# Patient Record
Sex: Female | Born: 1945
Health system: Southern US, Community
[De-identification: ages and names within clinical notes are randomized; demographics above are authoritative.]

## PROBLEM LIST (undated history)

## (undated) DIAGNOSIS — Z9221 Personal history of antineoplastic chemotherapy: Secondary | ICD-10-CM

## (undated) DIAGNOSIS — Z86711 Personal history of pulmonary embolism: Secondary | ICD-10-CM

## (undated) DIAGNOSIS — C801 Malignant (primary) neoplasm, unspecified: Secondary | ICD-10-CM

## (undated) DIAGNOSIS — G8929 Other chronic pain: Secondary | ICD-10-CM

## (undated) DIAGNOSIS — I82622 Acute embolism and thrombosis of deep veins of left upper extremity: Secondary | ICD-10-CM

## (undated) DIAGNOSIS — Z8759 Personal history of other complications of pregnancy, childbirth and the puerperium: Secondary | ICD-10-CM

## (undated) DIAGNOSIS — M549 Dorsalgia, unspecified: Secondary | ICD-10-CM

## (undated) HISTORY — PX: TUBAL LIGATION: SHX77

## (undated) HISTORY — PX: HIP FRACTURE SURGERY: SHX118

---

## 1999-06-13 ENCOUNTER — Ambulatory Visit (HOSPITAL_COMMUNITY): Admission: RE | Admit: 1999-06-13 | Discharge: 1999-06-13 | Payer: Self-pay | Admitting: Family Medicine

## 1999-06-13 ENCOUNTER — Encounter: Payer: Self-pay | Admitting: Family Medicine

## 1999-07-30 ENCOUNTER — Other Ambulatory Visit: Admission: RE | Admit: 1999-07-30 | Discharge: 1999-07-30 | Payer: Self-pay | Admitting: Obstetrics & Gynecology

## 1999-07-31 ENCOUNTER — Other Ambulatory Visit: Admission: RE | Admit: 1999-07-31 | Discharge: 1999-07-31 | Payer: Self-pay | Admitting: Obstetrics & Gynecology

## 1999-07-31 ENCOUNTER — Encounter (INDEPENDENT_AMBULATORY_CARE_PROVIDER_SITE_OTHER): Payer: Self-pay | Admitting: Specialist

## 1999-10-03 ENCOUNTER — Ambulatory Visit (HOSPITAL_COMMUNITY): Admission: RE | Admit: 1999-10-03 | Discharge: 1999-10-03 | Payer: Self-pay | Admitting: Obstetrics & Gynecology

## 1999-10-03 ENCOUNTER — Encounter (INDEPENDENT_AMBULATORY_CARE_PROVIDER_SITE_OTHER): Payer: Self-pay

## 1999-11-13 ENCOUNTER — Ambulatory Visit (HOSPITAL_COMMUNITY): Admission: RE | Admit: 1999-11-13 | Discharge: 1999-11-13 | Payer: Self-pay | Admitting: Family Medicine

## 1999-11-13 ENCOUNTER — Encounter: Payer: Self-pay | Admitting: Family Medicine

## 1999-11-15 ENCOUNTER — Inpatient Hospital Stay (HOSPITAL_COMMUNITY): Admission: EM | Admit: 1999-11-15 | Discharge: 1999-11-20 | Payer: Self-pay | Admitting: Emergency Medicine

## 2000-02-21 ENCOUNTER — Emergency Department (HOSPITAL_COMMUNITY): Admission: EM | Admit: 2000-02-21 | Discharge: 2000-02-21 | Payer: Self-pay | Admitting: Emergency Medicine

## 2001-07-01 ENCOUNTER — Other Ambulatory Visit: Admission: RE | Admit: 2001-07-01 | Discharge: 2001-07-01 | Payer: Self-pay | Admitting: Obstetrics & Gynecology

## 2002-05-30 ENCOUNTER — Encounter: Payer: Self-pay | Admitting: Podiatry

## 2002-05-30 ENCOUNTER — Inpatient Hospital Stay (HOSPITAL_COMMUNITY): Admission: EM | Admit: 2002-05-30 | Discharge: 2002-06-02 | Payer: Self-pay | Admitting: *Deleted

## 2002-05-30 ENCOUNTER — Ambulatory Visit (HOSPITAL_COMMUNITY): Admission: RE | Admit: 2002-05-30 | Discharge: 2002-05-30 | Payer: Self-pay | Admitting: Podiatry

## 2002-09-09 ENCOUNTER — Encounter: Payer: Self-pay | Admitting: Family Medicine

## 2002-09-09 ENCOUNTER — Encounter: Admission: RE | Admit: 2002-09-09 | Discharge: 2002-09-09 | Payer: Self-pay | Admitting: Family Medicine

## 2003-06-23 ENCOUNTER — Other Ambulatory Visit: Admission: RE | Admit: 2003-06-23 | Discharge: 2003-06-23 | Payer: Self-pay | Admitting: Family Medicine

## 2003-07-07 ENCOUNTER — Ambulatory Visit (HOSPITAL_COMMUNITY): Admission: RE | Admit: 2003-07-07 | Discharge: 2003-07-07 | Payer: Self-pay | Admitting: Family Medicine

## 2003-07-07 ENCOUNTER — Encounter: Payer: Self-pay | Admitting: Family Medicine

## 2004-04-19 ENCOUNTER — Ambulatory Visit (HOSPITAL_COMMUNITY): Admission: RE | Admit: 2004-04-19 | Discharge: 2004-04-19 | Payer: Self-pay | Admitting: Family Medicine

## 2005-01-16 ENCOUNTER — Other Ambulatory Visit: Admission: RE | Admit: 2005-01-16 | Discharge: 2005-01-16 | Payer: Self-pay | Admitting: Obstetrics & Gynecology

## 2006-04-03 ENCOUNTER — Other Ambulatory Visit: Admission: RE | Admit: 2006-04-03 | Discharge: 2006-04-03 | Payer: Self-pay | Admitting: Family Medicine

## 2009-07-04 ENCOUNTER — Emergency Department (HOSPITAL_COMMUNITY): Admission: EM | Admit: 2009-07-04 | Discharge: 2009-07-05 | Payer: Self-pay | Admitting: Emergency Medicine

## 2010-07-31 ENCOUNTER — Ambulatory Visit (HOSPITAL_COMMUNITY): Admission: RE | Admit: 2010-07-31 | Discharge: 2010-07-31 | Payer: Self-pay | Admitting: Orthopedic Surgery

## 2011-01-31 LAB — CBC
HCT: 36.5 % (ref 36.0–46.0)
MCHC: 32.8 g/dL (ref 30.0–36.0)
MCV: 82.7 fL (ref 78.0–100.0)
Platelets: 272 10*3/uL (ref 150–400)
RDW: 15.1 % (ref 11.5–15.5)
WBC: 8.5 10*3/uL (ref 4.0–10.5)

## 2011-01-31 LAB — DIFFERENTIAL
Basophils Absolute: 0 10*3/uL (ref 0.0–0.1)
Basophils Relative: 0 % (ref 0–1)
Eosinophils Absolute: 0.2 10*3/uL (ref 0.0–0.7)
Eosinophils Relative: 2 % (ref 0–5)
Lymphs Abs: 2.7 10*3/uL (ref 0.7–4.0)
Neutrophils Relative %: 61 % (ref 43–77)

## 2011-01-31 LAB — POCT I-STAT, CHEM 8
BUN: 20 mg/dL (ref 6–23)
Calcium, Ion: 1.12 mmol/L (ref 1.12–1.32)
Chloride: 108 mEq/L (ref 96–112)
Creatinine, Ser: 0.8 mg/dL (ref 0.4–1.2)
Glucose, Bld: 85 mg/dL (ref 70–99)
TCO2: 25 mmol/L (ref 0–100)

## 2011-01-31 LAB — PROTIME-INR
INR: 2.3 — ABNORMAL HIGH (ref 0.00–1.49)
Prothrombin Time: 24.8 seconds — ABNORMAL HIGH (ref 11.6–15.2)

## 2011-03-14 NOTE — Op Note (Signed)
[facility]  Patient:    Brittany [name]                      MRN: [id number] Proc. Date: 10/03/99 Adm. Date:  [id number] Attending:  Minette Headland                           Operative Report  PREOPERATIVE DIAGNOSIS:       Perimenopausal female on hormone replacement therapy with dysfunctional uterine bleeding, endometrial thickening, and probable small  endometrial polyp.  POSTOPERATIVE DIAGNOSIS:      Perimenopausal female on hormone replacement therapy with dysfunctional uterine bleeding, endometrial thickening, and probable small  endometrial polyp.  OPERATION:                    Hysteroscopy, D&C, and removal of polyp.  SURGEON:                      Freddy Finner, M.D.  ASSISTANT:  ANESTHESIA:                   Managed intravenous sedation, paracervical block using 10 cc of 1% plain Xylocaine with injections at 4 and 8 oclock in the vaginal fornices.  ESTIMATED BLOOD LOSS:         Less than 50 cc.  SORBITOL:                     Intraoperative Sorbitol deficit less than or equal to 100 cc.  INDICATIONS:                  The patient is a 65 year old on hormone replacement therapy and who has had irregular vaginal bleeding and had an ultrasound in the  office which showed significant thickening of the endometrium and what was thought to probably be a small endometrial polyp.  She is admitted now for a hysteroscopy, D&C.  FINDINGS:                     A small polyp.  The uterus sounded to 8.5 cm.  DESCRIPTION OF PROCEDURE:     The patient was admitted on the morning of surgery, brought to the operating room, given adequate intravenous sedation, and placed n the dorsal lithotomy position.  Betadine prep of the vagina was carried out. Bivalve speculum was introduced.  The cervix was cleansed again with Betadine.  Paracervical block was placed with 10 cc of 1% plain Xylocaine.  The anterior cervical lip was grasped with a  single tooth tenaculum.  The uterus sounded to .5 cm.  The uterine cervix was dilated to 23 with [place].  The [facility] hysteroscope was introduced and inspection revealed what appeared to be a small polyp free floating in the cavity and attached by a thin base.  Gentle thorough curettage and exploration with polyp forceps was performed.  Repeat inspection revealed absence of the polyp.  The procedure was terminated at this point.  The patient was taken to the recovery room in good condition and will be discharged with routine outpatient surgical instructions for follow-up in the office in 7 to 10 days. he is given Darvocet to be taken as needed for cramping pain if not relieved by Tylenol or Advil. DD:  10/03/99 TD:  10/03/99 Job: [id number]

## 2011-12-26 DIAGNOSIS — Z7901 Long term (current) use of anticoagulants: Secondary | ICD-10-CM | POA: Diagnosis not present

## 2012-01-30 DIAGNOSIS — Z7901 Long term (current) use of anticoagulants: Secondary | ICD-10-CM | POA: Diagnosis not present

## 2012-02-26 DIAGNOSIS — Z7901 Long term (current) use of anticoagulants: Secondary | ICD-10-CM | POA: Diagnosis not present

## 2012-03-25 DIAGNOSIS — Z7901 Long term (current) use of anticoagulants: Secondary | ICD-10-CM | POA: Diagnosis not present

## 2012-04-28 ENCOUNTER — Other Ambulatory Visit: Payer: Self-pay | Admitting: Family Medicine

## 2012-04-28 ENCOUNTER — Other Ambulatory Visit (HOSPITAL_COMMUNITY): Payer: Self-pay | Admitting: Family Medicine

## 2012-04-28 ENCOUNTER — Other Ambulatory Visit (HOSPITAL_COMMUNITY)
Admission: RE | Admit: 2012-04-28 | Discharge: 2012-04-28 | Disposition: A | Discharge status: Home / Self Care | Payer: Medicare Other | Source: Ambulatory Visit | Attending: Family Medicine | Admitting: Family Medicine

## 2012-04-28 DIAGNOSIS — Z124 Encounter for screening for malignant neoplasm of cervix: Secondary | ICD-10-CM | POA: Insufficient documentation

## 2012-04-28 DIAGNOSIS — Z780 Asymptomatic menopausal state: Secondary | ICD-10-CM

## 2012-04-28 DIAGNOSIS — Z7901 Long term (current) use of anticoagulants: Secondary | ICD-10-CM | POA: Diagnosis not present

## 2012-04-28 DIAGNOSIS — Z23 Encounter for immunization: Secondary | ICD-10-CM | POA: Diagnosis not present

## 2012-04-28 DIAGNOSIS — E785 Hyperlipidemia, unspecified: Secondary | ICD-10-CM | POA: Diagnosis not present

## 2012-04-28 DIAGNOSIS — Z0000 Encounter for general adult medical examination without abnormal findings: Secondary | ICD-10-CM | POA: Diagnosis not present

## 2012-04-28 DIAGNOSIS — R002 Palpitations: Secondary | ICD-10-CM | POA: Diagnosis not present

## 2012-04-28 DIAGNOSIS — Z1231 Encounter for screening mammogram for malignant neoplasm of breast: Secondary | ICD-10-CM

## 2012-04-30 DIAGNOSIS — E785 Hyperlipidemia, unspecified: Secondary | ICD-10-CM | POA: Diagnosis not present

## 2012-05-15 DIAGNOSIS — L259 Unspecified contact dermatitis, unspecified cause: Secondary | ICD-10-CM | POA: Diagnosis not present

## 2012-05-27 ENCOUNTER — Ambulatory Visit (HOSPITAL_COMMUNITY)
Admission: RE | Admit: 2012-05-27 | Discharge: 2012-05-27 | Disposition: A | Discharge status: Home / Self Care | Payer: Medicare Other | Source: Ambulatory Visit | Attending: Family Medicine | Admitting: Family Medicine

## 2012-05-27 DIAGNOSIS — Z13820 Encounter for screening for osteoporosis: Secondary | ICD-10-CM | POA: Diagnosis not present

## 2012-05-27 DIAGNOSIS — Z1231 Encounter for screening mammogram for malignant neoplasm of breast: Secondary | ICD-10-CM | POA: Diagnosis not present

## 2012-05-27 DIAGNOSIS — Z780 Asymptomatic menopausal state: Secondary | ICD-10-CM | POA: Insufficient documentation

## 2012-06-07 DIAGNOSIS — Z7901 Long term (current) use of anticoagulants: Secondary | ICD-10-CM | POA: Diagnosis not present

## 2012-06-15 ENCOUNTER — Other Ambulatory Visit: Payer: Self-pay | Admitting: Gastroenterology

## 2012-06-15 DIAGNOSIS — Z1211 Encounter for screening for malignant neoplasm of colon: Secondary | ICD-10-CM | POA: Diagnosis not present

## 2012-06-15 DIAGNOSIS — D128 Benign neoplasm of rectum: Secondary | ICD-10-CM | POA: Diagnosis not present

## 2012-06-15 DIAGNOSIS — D126 Benign neoplasm of colon, unspecified: Secondary | ICD-10-CM | POA: Diagnosis not present

## 2012-06-15 DIAGNOSIS — D129 Benign neoplasm of anus and anal canal: Secondary | ICD-10-CM | POA: Diagnosis not present

## 2012-07-05 DIAGNOSIS — Z7901 Long term (current) use of anticoagulants: Secondary | ICD-10-CM | POA: Diagnosis not present

## 2012-08-02 DIAGNOSIS — Z7901 Long term (current) use of anticoagulants: Secondary | ICD-10-CM | POA: Diagnosis not present

## 2012-09-02 DIAGNOSIS — Z7901 Long term (current) use of anticoagulants: Secondary | ICD-10-CM | POA: Diagnosis not present

## 2012-10-07 DIAGNOSIS — Z7901 Long term (current) use of anticoagulants: Secondary | ICD-10-CM | POA: Diagnosis not present

## 2012-11-11 DIAGNOSIS — Z7901 Long term (current) use of anticoagulants: Secondary | ICD-10-CM | POA: Diagnosis not present

## 2012-12-17 DIAGNOSIS — Z7901 Long term (current) use of anticoagulants: Secondary | ICD-10-CM | POA: Diagnosis not present

## 2013-01-13 DIAGNOSIS — Z7901 Long term (current) use of anticoagulants: Secondary | ICD-10-CM | POA: Diagnosis not present

## 2013-02-17 DIAGNOSIS — Z7901 Long term (current) use of anticoagulants: Secondary | ICD-10-CM | POA: Diagnosis not present

## 2013-03-17 DIAGNOSIS — L259 Unspecified contact dermatitis, unspecified cause: Secondary | ICD-10-CM | POA: Diagnosis not present

## 2013-03-17 DIAGNOSIS — Z5181 Encounter for therapeutic drug level monitoring: Secondary | ICD-10-CM | POA: Diagnosis not present

## 2013-03-31 DIAGNOSIS — Z7901 Long term (current) use of anticoagulants: Secondary | ICD-10-CM | POA: Diagnosis not present

## 2013-05-06 DIAGNOSIS — Z7901 Long term (current) use of anticoagulants: Secondary | ICD-10-CM | POA: Diagnosis not present

## 2013-06-16 DIAGNOSIS — Z7901 Long term (current) use of anticoagulants: Secondary | ICD-10-CM | POA: Diagnosis not present

## 2013-07-21 DIAGNOSIS — Z23 Encounter for immunization: Secondary | ICD-10-CM | POA: Diagnosis not present

## 2013-07-21 DIAGNOSIS — Z7901 Long term (current) use of anticoagulants: Secondary | ICD-10-CM | POA: Diagnosis not present

## 2013-08-19 DIAGNOSIS — I749 Embolism and thrombosis of unspecified artery: Secondary | ICD-10-CM | POA: Diagnosis not present

## 2013-08-19 DIAGNOSIS — Z7901 Long term (current) use of anticoagulants: Secondary | ICD-10-CM | POA: Diagnosis not present

## 2013-09-16 DIAGNOSIS — Z7901 Long term (current) use of anticoagulants: Secondary | ICD-10-CM | POA: Diagnosis not present

## 2013-09-16 DIAGNOSIS — I749 Embolism and thrombosis of unspecified artery: Secondary | ICD-10-CM | POA: Diagnosis not present

## 2013-10-12 DIAGNOSIS — I749 Embolism and thrombosis of unspecified artery: Secondary | ICD-10-CM | POA: Diagnosis not present

## 2013-10-12 DIAGNOSIS — Z7901 Long term (current) use of anticoagulants: Secondary | ICD-10-CM | POA: Diagnosis not present

## 2013-11-09 DIAGNOSIS — I749 Embolism and thrombosis of unspecified artery: Secondary | ICD-10-CM | POA: Diagnosis not present

## 2013-11-09 DIAGNOSIS — Z7901 Long term (current) use of anticoagulants: Secondary | ICD-10-CM | POA: Diagnosis not present

## 2013-12-09 DIAGNOSIS — I749 Embolism and thrombosis of unspecified artery: Secondary | ICD-10-CM | POA: Diagnosis not present

## 2013-12-09 DIAGNOSIS — Z7901 Long term (current) use of anticoagulants: Secondary | ICD-10-CM | POA: Diagnosis not present

## 2013-12-29 ENCOUNTER — Other Ambulatory Visit (HOSPITAL_COMMUNITY): Payer: Self-pay | Admitting: Family Medicine

## 2013-12-29 DIAGNOSIS — Z1231 Encounter for screening mammogram for malignant neoplasm of breast: Secondary | ICD-10-CM

## 2014-01-03 ENCOUNTER — Ambulatory Visit (HOSPITAL_COMMUNITY)
Admission: RE | Admit: 2014-01-03 | Discharge: 2014-01-03 | Disposition: A | Discharge status: Home / Self Care | Payer: Medicare Other | Source: Ambulatory Visit | Attending: Family Medicine | Admitting: Family Medicine

## 2014-01-03 DIAGNOSIS — Z1231 Encounter for screening mammogram for malignant neoplasm of breast: Secondary | ICD-10-CM | POA: Insufficient documentation

## 2014-01-20 DIAGNOSIS — Z23 Encounter for immunization: Secondary | ICD-10-CM | POA: Diagnosis not present

## 2014-01-20 DIAGNOSIS — I749 Embolism and thrombosis of unspecified artery: Secondary | ICD-10-CM | POA: Diagnosis not present

## 2014-01-20 DIAGNOSIS — Z0000 Encounter for general adult medical examination without abnormal findings: Secondary | ICD-10-CM | POA: Diagnosis not present

## 2014-01-20 DIAGNOSIS — E785 Hyperlipidemia, unspecified: Secondary | ICD-10-CM | POA: Diagnosis not present

## 2014-01-20 DIAGNOSIS — Z7901 Long term (current) use of anticoagulants: Secondary | ICD-10-CM | POA: Diagnosis not present

## 2014-01-20 DIAGNOSIS — R030 Elevated blood-pressure reading, without diagnosis of hypertension: Secondary | ICD-10-CM | POA: Diagnosis not present

## 2015-04-03 ENCOUNTER — Other Ambulatory Visit (HOSPITAL_COMMUNITY): Payer: Self-pay | Admitting: Family Medicine

## 2015-04-24 ENCOUNTER — Other Ambulatory Visit: Payer: Self-pay | Admitting: Family Medicine

## 2015-04-24 DIAGNOSIS — N6320 Unspecified lump in the left breast, unspecified quadrant: Secondary | ICD-10-CM

## 2015-04-24 DIAGNOSIS — Z0000 Encounter for general adult medical examination without abnormal findings: Secondary | ICD-10-CM | POA: Diagnosis not present

## 2015-04-24 DIAGNOSIS — E785 Hyperlipidemia, unspecified: Secondary | ICD-10-CM | POA: Diagnosis not present

## 2015-04-24 DIAGNOSIS — N3281 Overactive bladder: Secondary | ICD-10-CM | POA: Diagnosis not present

## 2015-04-24 DIAGNOSIS — I2782 Chronic pulmonary embolism: Secondary | ICD-10-CM | POA: Diagnosis not present

## 2015-04-24 DIAGNOSIS — N63 Unspecified lump in breast: Secondary | ICD-10-CM | POA: Diagnosis not present

## 2015-04-24 DIAGNOSIS — N951 Menopausal and female climacteric states: Secondary | ICD-10-CM | POA: Diagnosis not present

## 2015-04-24 DIAGNOSIS — Z1159 Encounter for screening for other viral diseases: Secondary | ICD-10-CM | POA: Diagnosis not present

## 2015-04-26 ENCOUNTER — Ambulatory Visit
Admission: RE | Admit: 2015-04-26 | Discharge: 2015-04-26 | Disposition: A | Discharge status: Home / Self Care | Payer: Medicare Other | Source: Ambulatory Visit | Attending: Family Medicine | Admitting: Family Medicine

## 2015-04-26 DIAGNOSIS — N6320 Unspecified lump in the left breast, unspecified quadrant: Secondary | ICD-10-CM

## 2015-04-26 DIAGNOSIS — R928 Other abnormal and inconclusive findings on diagnostic imaging of breast: Secondary | ICD-10-CM | POA: Diagnosis not present

## 2015-06-25 DIAGNOSIS — Z7901 Long term (current) use of anticoagulants: Secondary | ICD-10-CM | POA: Diagnosis not present

## 2015-07-09 DIAGNOSIS — Z7901 Long term (current) use of anticoagulants: Secondary | ICD-10-CM | POA: Diagnosis not present

## 2015-07-09 DIAGNOSIS — I8291 Chronic embolism and thrombosis of unspecified vein: Secondary | ICD-10-CM | POA: Diagnosis not present

## 2015-07-23 DIAGNOSIS — I8291 Chronic embolism and thrombosis of unspecified vein: Secondary | ICD-10-CM | POA: Diagnosis not present

## 2015-07-23 DIAGNOSIS — Z7901 Long term (current) use of anticoagulants: Secondary | ICD-10-CM | POA: Diagnosis not present

## 2015-08-01 DIAGNOSIS — Z7901 Long term (current) use of anticoagulants: Secondary | ICD-10-CM | POA: Diagnosis not present

## 2015-08-01 DIAGNOSIS — I8291 Chronic embolism and thrombosis of unspecified vein: Secondary | ICD-10-CM | POA: Diagnosis not present

## 2015-10-02 DIAGNOSIS — D122 Benign neoplasm of ascending colon: Secondary | ICD-10-CM | POA: Diagnosis not present

## 2015-10-02 DIAGNOSIS — D123 Benign neoplasm of transverse colon: Secondary | ICD-10-CM | POA: Diagnosis not present

## 2015-10-02 DIAGNOSIS — Z86010 Personal history of colonic polyps: Secondary | ICD-10-CM | POA: Diagnosis not present

## 2015-10-02 DIAGNOSIS — Z09 Encounter for follow-up examination after completed treatment for conditions other than malignant neoplasm: Secondary | ICD-10-CM | POA: Diagnosis not present

## 2015-10-02 DIAGNOSIS — K635 Polyp of colon: Secondary | ICD-10-CM | POA: Diagnosis not present

## 2015-10-02 DIAGNOSIS — D126 Benign neoplasm of colon, unspecified: Secondary | ICD-10-CM | POA: Diagnosis not present

## 2015-11-08 DIAGNOSIS — M79605 Pain in left leg: Secondary | ICD-10-CM | POA: Diagnosis not present

## 2015-11-08 DIAGNOSIS — M5412 Radiculopathy, cervical region: Secondary | ICD-10-CM | POA: Diagnosis not present

## 2015-11-08 DIAGNOSIS — I8291 Chronic embolism and thrombosis of unspecified vein: Secondary | ICD-10-CM | POA: Diagnosis not present

## 2015-11-08 DIAGNOSIS — Z7901 Long term (current) use of anticoagulants: Secondary | ICD-10-CM | POA: Diagnosis not present

## 2015-12-10 DIAGNOSIS — I8291 Chronic embolism and thrombosis of unspecified vein: Secondary | ICD-10-CM | POA: Diagnosis not present

## 2015-12-10 DIAGNOSIS — Z7901 Long term (current) use of anticoagulants: Secondary | ICD-10-CM | POA: Diagnosis not present

## 2016-01-07 DIAGNOSIS — Z7901 Long term (current) use of anticoagulants: Secondary | ICD-10-CM | POA: Diagnosis not present

## 2016-01-07 DIAGNOSIS — I8291 Chronic embolism and thrombosis of unspecified vein: Secondary | ICD-10-CM | POA: Diagnosis not present

## 2016-02-11 DIAGNOSIS — Z7901 Long term (current) use of anticoagulants: Secondary | ICD-10-CM | POA: Diagnosis not present

## 2016-02-11 DIAGNOSIS — I8291 Chronic embolism and thrombosis of unspecified vein: Secondary | ICD-10-CM | POA: Diagnosis not present

## 2016-02-25 DIAGNOSIS — Z7901 Long term (current) use of anticoagulants: Secondary | ICD-10-CM | POA: Diagnosis not present

## 2016-02-25 DIAGNOSIS — I8291 Chronic embolism and thrombosis of unspecified vein: Secondary | ICD-10-CM | POA: Diagnosis not present

## 2016-03-10 DIAGNOSIS — I8291 Chronic embolism and thrombosis of unspecified vein: Secondary | ICD-10-CM | POA: Diagnosis not present

## 2016-03-10 DIAGNOSIS — Z7901 Long term (current) use of anticoagulants: Secondary | ICD-10-CM | POA: Diagnosis not present

## 2016-03-25 DIAGNOSIS — I8291 Chronic embolism and thrombosis of unspecified vein: Secondary | ICD-10-CM | POA: Diagnosis not present

## 2016-03-25 DIAGNOSIS — Z7901 Long term (current) use of anticoagulants: Secondary | ICD-10-CM | POA: Diagnosis not present

## 2016-04-08 DIAGNOSIS — Z7901 Long term (current) use of anticoagulants: Secondary | ICD-10-CM | POA: Diagnosis not present

## 2016-04-08 DIAGNOSIS — I8291 Chronic embolism and thrombosis of unspecified vein: Secondary | ICD-10-CM | POA: Diagnosis not present

## 2016-05-07 DIAGNOSIS — I8291 Chronic embolism and thrombosis of unspecified vein: Secondary | ICD-10-CM | POA: Diagnosis not present

## 2016-05-07 DIAGNOSIS — Z7901 Long term (current) use of anticoagulants: Secondary | ICD-10-CM | POA: Diagnosis not present

## 2016-05-14 DIAGNOSIS — Z7901 Long term (current) use of anticoagulants: Secondary | ICD-10-CM | POA: Diagnosis not present

## 2016-05-14 DIAGNOSIS — I8291 Chronic embolism and thrombosis of unspecified vein: Secondary | ICD-10-CM | POA: Diagnosis not present

## 2016-05-21 DIAGNOSIS — I8291 Chronic embolism and thrombosis of unspecified vein: Secondary | ICD-10-CM | POA: Diagnosis not present

## 2016-05-21 DIAGNOSIS — Z7901 Long term (current) use of anticoagulants: Secondary | ICD-10-CM | POA: Diagnosis not present

## 2016-06-25 DIAGNOSIS — M79604 Pain in right leg: Secondary | ICD-10-CM | POA: Diagnosis not present

## 2016-06-25 DIAGNOSIS — I8291 Chronic embolism and thrombosis of unspecified vein: Secondary | ICD-10-CM | POA: Diagnosis not present

## 2016-06-25 DIAGNOSIS — Z7901 Long term (current) use of anticoagulants: Secondary | ICD-10-CM | POA: Diagnosis not present

## 2016-07-23 DIAGNOSIS — I8291 Chronic embolism and thrombosis of unspecified vein: Secondary | ICD-10-CM | POA: Diagnosis not present

## 2016-07-23 DIAGNOSIS — Z7901 Long term (current) use of anticoagulants: Secondary | ICD-10-CM | POA: Diagnosis not present

## 2016-08-20 DIAGNOSIS — I8291 Chronic embolism and thrombosis of unspecified vein: Secondary | ICD-10-CM | POA: Diagnosis not present

## 2016-08-20 DIAGNOSIS — Z7901 Long term (current) use of anticoagulants: Secondary | ICD-10-CM | POA: Diagnosis not present

## 2016-08-25 DIAGNOSIS — Z23 Encounter for immunization: Secondary | ICD-10-CM | POA: Diagnosis not present

## 2016-09-17 DIAGNOSIS — Z7901 Long term (current) use of anticoagulants: Secondary | ICD-10-CM | POA: Diagnosis not present

## 2016-09-17 DIAGNOSIS — I8291 Chronic embolism and thrombosis of unspecified vein: Secondary | ICD-10-CM | POA: Diagnosis not present

## 2016-10-01 DIAGNOSIS — Z7901 Long term (current) use of anticoagulants: Secondary | ICD-10-CM | POA: Diagnosis not present

## 2016-10-01 DIAGNOSIS — I8291 Chronic embolism and thrombosis of unspecified vein: Secondary | ICD-10-CM | POA: Diagnosis not present

## 2016-10-29 DIAGNOSIS — Z7901 Long term (current) use of anticoagulants: Secondary | ICD-10-CM | POA: Diagnosis not present

## 2016-10-29 DIAGNOSIS — I2782 Chronic pulmonary embolism: Secondary | ICD-10-CM | POA: Diagnosis not present

## 2016-11-26 DIAGNOSIS — I8291 Chronic embolism and thrombosis of unspecified vein: Secondary | ICD-10-CM | POA: Diagnosis not present

## 2016-11-26 DIAGNOSIS — Z7901 Long term (current) use of anticoagulants: Secondary | ICD-10-CM | POA: Diagnosis not present

## 2016-12-24 DIAGNOSIS — Z86711 Personal history of pulmonary embolism: Secondary | ICD-10-CM | POA: Diagnosis not present

## 2016-12-24 DIAGNOSIS — Z7901 Long term (current) use of anticoagulants: Secondary | ICD-10-CM | POA: Diagnosis not present

## 2016-12-24 DIAGNOSIS — R252 Cramp and spasm: Secondary | ICD-10-CM | POA: Diagnosis not present

## 2016-12-24 DIAGNOSIS — R600 Localized edema: Secondary | ICD-10-CM | POA: Diagnosis not present

## 2017-01-27 DIAGNOSIS — I2782 Chronic pulmonary embolism: Secondary | ICD-10-CM | POA: Diagnosis not present

## 2017-01-27 DIAGNOSIS — Z7901 Long term (current) use of anticoagulants: Secondary | ICD-10-CM | POA: Diagnosis not present

## 2017-02-25 DIAGNOSIS — I8291 Chronic embolism and thrombosis of unspecified vein: Secondary | ICD-10-CM | POA: Diagnosis not present

## 2017-02-25 DIAGNOSIS — Z7901 Long term (current) use of anticoagulants: Secondary | ICD-10-CM | POA: Diagnosis not present

## 2017-03-24 DIAGNOSIS — Z7901 Long term (current) use of anticoagulants: Secondary | ICD-10-CM | POA: Diagnosis not present

## 2017-03-24 DIAGNOSIS — I8291 Chronic embolism and thrombosis of unspecified vein: Secondary | ICD-10-CM | POA: Diagnosis not present

## 2017-04-21 DIAGNOSIS — I8291 Chronic embolism and thrombosis of unspecified vein: Secondary | ICD-10-CM | POA: Diagnosis not present

## 2017-04-21 DIAGNOSIS — Z7901 Long term (current) use of anticoagulants: Secondary | ICD-10-CM | POA: Diagnosis not present

## 2017-04-27 ENCOUNTER — Other Ambulatory Visit: Payer: Self-pay | Admitting: Family Medicine

## 2017-04-27 DIAGNOSIS — Z1231 Encounter for screening mammogram for malignant neoplasm of breast: Secondary | ICD-10-CM

## 2017-05-20 ENCOUNTER — Ambulatory Visit
Admission: RE | Admit: 2017-05-20 | Discharge: 2017-05-20 | Disposition: A | Discharge status: Home / Self Care | Payer: Medicare Other | Source: Ambulatory Visit | Attending: Family Medicine | Admitting: Family Medicine

## 2017-05-20 ENCOUNTER — Other Ambulatory Visit: Payer: Self-pay | Admitting: Family Medicine

## 2017-05-20 DIAGNOSIS — R928 Other abnormal and inconclusive findings on diagnostic imaging of breast: Secondary | ICD-10-CM

## 2017-05-20 DIAGNOSIS — N6452 Nipple discharge: Secondary | ICD-10-CM

## 2017-05-20 DIAGNOSIS — N6489 Other specified disorders of breast: Secondary | ICD-10-CM

## 2017-05-20 DIAGNOSIS — Z1231 Encounter for screening mammogram for malignant neoplasm of breast: Secondary | ICD-10-CM

## 2017-05-21 DIAGNOSIS — Z7901 Long term (current) use of anticoagulants: Secondary | ICD-10-CM | POA: Diagnosis not present

## 2017-05-21 DIAGNOSIS — I8291 Chronic embolism and thrombosis of unspecified vein: Secondary | ICD-10-CM | POA: Diagnosis not present

## 2017-05-25 ENCOUNTER — Ambulatory Visit
Admission: RE | Admit: 2017-05-25 | Discharge: 2017-05-25 | Disposition: A | Discharge status: Home / Self Care | Payer: Medicare Other | Source: Ambulatory Visit | Attending: Family Medicine | Admitting: Family Medicine

## 2017-05-25 ENCOUNTER — Other Ambulatory Visit: Payer: Self-pay | Admitting: Family Medicine

## 2017-05-25 DIAGNOSIS — R928 Other abnormal and inconclusive findings on diagnostic imaging of breast: Secondary | ICD-10-CM

## 2017-05-25 DIAGNOSIS — N6011 Diffuse cystic mastopathy of right breast: Secondary | ICD-10-CM | POA: Diagnosis not present

## 2017-05-25 DIAGNOSIS — N6489 Other specified disorders of breast: Secondary | ICD-10-CM | POA: Diagnosis not present

## 2017-05-25 HISTORY — PX: BREAST BIOPSY: SHX20

## 2017-05-26 ENCOUNTER — Other Ambulatory Visit: Payer: Self-pay | Admitting: Family Medicine

## 2017-05-26 DIAGNOSIS — N6452 Nipple discharge: Secondary | ICD-10-CM

## 2017-05-28 DIAGNOSIS — Z7901 Long term (current) use of anticoagulants: Secondary | ICD-10-CM | POA: Diagnosis not present

## 2017-05-28 DIAGNOSIS — I8291 Chronic embolism and thrombosis of unspecified vein: Secondary | ICD-10-CM | POA: Diagnosis not present

## 2017-06-03 ENCOUNTER — Telehealth: Payer: Self-pay | Admitting: Diagnostic Radiology

## 2017-06-11 DIAGNOSIS — Z7901 Long term (current) use of anticoagulants: Secondary | ICD-10-CM | POA: Diagnosis not present

## 2017-06-11 DIAGNOSIS — I8291 Chronic embolism and thrombosis of unspecified vein: Secondary | ICD-10-CM | POA: Diagnosis not present

## 2017-06-15 ENCOUNTER — Ambulatory Visit
Admission: RE | Admit: 2017-06-15 | Discharge: 2017-06-15 | Disposition: A | Discharge status: Home / Self Care | Payer: Medicare Other | Source: Ambulatory Visit | Attending: Family Medicine | Admitting: Family Medicine

## 2017-06-15 DIAGNOSIS — N6452 Nipple discharge: Secondary | ICD-10-CM

## 2017-06-15 MED ORDER — GADOBENATE DIMEGLUMINE 529 MG/ML IV SOLN
20.0000 mL | Freq: Once | INTRAVENOUS | Status: AC | PRN
  Administered 2017-06-15: 20 mL via INTRAVENOUS

## 2017-06-17 ENCOUNTER — Other Ambulatory Visit: Payer: Self-pay | Admitting: Family Medicine

## 2017-06-17 DIAGNOSIS — Z0000 Encounter for general adult medical examination without abnormal findings: Secondary | ICD-10-CM | POA: Diagnosis not present

## 2017-06-17 DIAGNOSIS — R9389 Abnormal findings on diagnostic imaging of other specified body structures: Secondary | ICD-10-CM

## 2017-06-17 DIAGNOSIS — Z86711 Personal history of pulmonary embolism: Secondary | ICD-10-CM | POA: Diagnosis not present

## 2017-06-17 DIAGNOSIS — E7800 Pure hypercholesterolemia, unspecified: Secondary | ICD-10-CM | POA: Diagnosis not present

## 2017-06-17 DIAGNOSIS — R928 Other abnormal and inconclusive findings on diagnostic imaging of breast: Secondary | ICD-10-CM

## 2017-06-17 DIAGNOSIS — E2839 Other primary ovarian failure: Secondary | ICD-10-CM | POA: Diagnosis not present

## 2017-06-17 DIAGNOSIS — Z23 Encounter for immunization: Secondary | ICD-10-CM | POA: Diagnosis not present

## 2017-06-17 DIAGNOSIS — Z7901 Long term (current) use of anticoagulants: Secondary | ICD-10-CM | POA: Diagnosis not present

## 2017-06-17 DIAGNOSIS — N6489 Other specified disorders of breast: Secondary | ICD-10-CM

## 2017-06-19 ENCOUNTER — Ambulatory Visit
Admission: RE | Admit: 2017-06-19 | Discharge: 2017-06-19 | Disposition: A | Discharge status: Home / Self Care | Payer: Medicare Other | Source: Ambulatory Visit | Attending: Family Medicine | Admitting: Family Medicine

## 2017-06-19 DIAGNOSIS — R928 Other abnormal and inconclusive findings on diagnostic imaging of breast: Secondary | ICD-10-CM | POA: Diagnosis not present

## 2017-06-19 DIAGNOSIS — C50411 Malignant neoplasm of upper-outer quadrant of right female breast: Secondary | ICD-10-CM | POA: Diagnosis not present

## 2017-06-19 DIAGNOSIS — N6489 Other specified disorders of breast: Secondary | ICD-10-CM | POA: Diagnosis not present

## 2017-06-19 DIAGNOSIS — Z170 Estrogen receptor positive status [ER+]: Secondary | ICD-10-CM | POA: Diagnosis not present

## 2017-06-19 MED ORDER — GADOBENATE DIMEGLUMINE 529 MG/ML IV SOLN
20.0000 mL | Freq: Once | INTRAVENOUS | Status: AC | PRN
  Administered 2017-06-19: 20 mL via INTRAVENOUS

## 2017-06-22 DIAGNOSIS — Z7901 Long term (current) use of anticoagulants: Secondary | ICD-10-CM | POA: Diagnosis not present

## 2017-06-22 DIAGNOSIS — C50411 Malignant neoplasm of upper-outer quadrant of right female breast: Secondary | ICD-10-CM | POA: Diagnosis not present

## 2017-06-22 DIAGNOSIS — Z8759 Personal history of other complications of pregnancy, childbirth and the puerperium: Secondary | ICD-10-CM | POA: Diagnosis not present

## 2017-06-22 DIAGNOSIS — Z86711 Personal history of pulmonary embolism: Secondary | ICD-10-CM | POA: Diagnosis not present

## 2017-06-22 DIAGNOSIS — Z5181 Encounter for therapeutic drug level monitoring: Secondary | ICD-10-CM | POA: Diagnosis not present

## 2017-06-22 DIAGNOSIS — Z9851 Tubal ligation status: Secondary | ICD-10-CM | POA: Diagnosis not present

## 2017-06-22 HISTORY — PX: BREAST BIOPSY: SHX20

## 2017-06-24 ENCOUNTER — Telehealth: Payer: Self-pay | Admitting: Hematology and Oncology

## 2017-06-24 NOTE — Telephone Encounter (Signed)
Pt cld and confirmed appt with Dr. Lindi Adie on 8/30 at 1130am.

## 2017-06-25 ENCOUNTER — Ambulatory Visit (HOSPITAL_BASED_OUTPATIENT_CLINIC_OR_DEPARTMENT_OTHER): Payer: Medicare Other | Admitting: Hematology and Oncology

## 2017-06-25 ENCOUNTER — Encounter: Payer: Self-pay | Admitting: Hematology and Oncology

## 2017-06-25 ENCOUNTER — Other Ambulatory Visit: Payer: Self-pay | Admitting: *Deleted

## 2017-06-25 ENCOUNTER — Encounter: Payer: Self-pay | Admitting: *Deleted

## 2017-06-25 DIAGNOSIS — Z170 Estrogen receptor positive status [ER+]: Secondary | ICD-10-CM

## 2017-06-25 DIAGNOSIS — C50411 Malignant neoplasm of upper-outer quadrant of right female breast: Secondary | ICD-10-CM | POA: Diagnosis not present

## 2017-06-25 DIAGNOSIS — Z7901 Long term (current) use of anticoagulants: Secondary | ICD-10-CM | POA: Diagnosis not present

## 2017-06-25 DIAGNOSIS — I8291 Chronic embolism and thrombosis of unspecified vein: Secondary | ICD-10-CM | POA: Diagnosis not present

## 2017-06-25 MED ORDER — ANASTROZOLE 1 MG PO TABS: 1.0000 mg | ORAL_TABLET | Freq: Every day | ORAL | 3 refills | Status: DC

## 2017-06-25 MED ORDER — WARFARIN SODIUM 5 MG PO TABS: 5.0000 mg | ORAL_TABLET | Freq: Every day | ORAL | Status: AC

## 2017-06-25 MED ORDER — WARFARIN SODIUM 7.5 MG PO TABS: 7.5000 mg | ORAL_TABLET | Freq: Every day | ORAL | Status: AC

## 2017-06-25 NOTE — Progress Notes (Signed)
[facility] CONSULT NOTE  Patient Care Team: [name], MD as PCP - General (Family Medicine)  CHIEF COMPLAINTS/PURPOSE OF CONSULTATION:  Newly diagnosed breast cancer  HISTORY OF PRESENTING ILLNESS:  Brittany [name] 71 y.o. female is here because of recent diagnosis of  Right breast cancer. Patient had intermittent bloody nipple discharge for the past 3 months. She had initial mammograms that showed an asymmetry in the right breast upper outer quadrant. Ultrasound was negative. Biopsy of this was performed on 05/26/2017 which revealed fibrocystic changes. Because this breast MRI was ordered which revealed a 5.6 area involvement of non-mass enhancement. MRI guided biopsy came back as invasive ductal carcinoma with DCIS grade 2 that is ER/PR positive HER-2 negative with a Ki-67 of 5%. She was seen by Dr. Dalbert Batman who referred her to [place] to discuss neoadjuvant therapy to shrink the tumors that she can undergo lumpectomy.  She is here today accompanied by her husband. She reports no pain or discomfort other than the intermittent bloody nipple discharge  I reviewed her records extensively and collaborated the history with the patient.  SUMMARY OF ONCOLOGIC HISTORY:   Malignant neoplasm of upper-outer quadrant of right breast in female, estrogen receptor positive ([place])   06/19/2017 Initial Diagnosis    Intermittent bloody nipple discharge in the right breast, mammogram showed asymmetry right breast upper outer quadrant ultrasound negative biopsy 05/26/2017 fibrocystic changes ; breast MRI revealed 5.6 x 3.4 cm non-mass enhancement, MRI guided biopsy revealed: IDC with DCIS, grade 2, ER 100%, PR 15%, Ki-67 5%, T3 N0 stage IIA AJCC 8 clinical stage      MEDICAL HISTORY:  no previous medical problems SURGICAL HISTORY: no prior surgeries  SOCIAL HISTORY: denies any tobacco alcohol integration drug use FAMILY HISTORY: her father and brother were diagnosed with prostate  cancer  ALLERGIES:  has no allergies on file.  MEDICATIONS:  Current Outpatient Prescriptions  Medication Sig Dispense Refill  . anastrozole (ARIMIDEX) 1 MG tablet Take 1 tablet (1 mg total) by mouth daily. 90 tablet 3  . warfarin (COUMADIN) 5 MG tablet Take 1 tablet (5 mg total) by mouth daily. Except Tue and Thu    . warfarin (COUMADIN) 7.5 MG tablet Take 1 tablet (7.5 mg total) by mouth daily. Tue and Thu     No current facility-administered medications for this visit.     REVIEW OF SYSTEMS:   Constitutional: Denies fevers, chills or abnormal night sweats Eyes: Denies blurriness of vision, double vision or watery eyes Ears, nose, mouth, throat, and face: Denies mucositis or sore throat Respiratory: Denies cough, dyspnea or wheezes Cardiovascular: Denies palpitation, chest discomfort or lower extremity swelling Gastrointestinal:  Denies nausea, heartburn or change in bowel habits Skin: Denies abnormal skin rashes Lymphatics: Denies new lymphadenopathy or easy bruising Neurological:Denies numbness, tingling or new weaknesses Behavioral/Psych: Mood is stable, no new changes  Breast: bloody nipple discharge All other systems were reviewed with the patient and are negative.  PHYSICAL EXAMINATION: ECOG PERFORMANCE STATUS: 1 - Symptomatic but completely ambulatory  Vitals:   06/25/17 1116  BP: 139/67  Pulse: 72  Resp: 18  Temp: 97.9 F (36.6 C)  SpO2: 100%   Filed Weights   06/25/17 1116  Weight: 226 lb (102.5 kg)    GENERAL:alert, no distress and comfortable SKIN: skin color, texture, turgor are normal, no rashes or significant lesions EYES: normal, conjunctiva are pink and non-injected, sclera clear OROPHARYNX:no exudate, no erythema and lips, buccal mucosa, and tongue normal  NECK: supple,  thyroid normal size, non-tender, without nodularity LYMPH:  no palpable lymphadenopathy in the cervical, axillary or inguinal LUNGS: clear to auscultation and percussion with  normal breathing effort HEART: regular rate & rhythm and no murmurs and no lower extremity edema ABDOMEN:abdomen soft, non-tender and normal bowel sounds Musculoskeletal:no cyanosis of digits and no clubbing  PSYCH: alert & oriented x 3 with fluent speech NEURO: no focal motor/sensory deficits BREAST: No palpable nodules in breast. No palpable axillary or supraclavicular lymphadenopathy (exam performed in the presence of a chaperone)   LABORATORY DATA:  I have reviewed the data as listed Lab Results  Component Value Date   WBC 8.5 07/04/2009   HGB 22.1 (H) 07/04/2009   HCT 65.0 (H) 07/04/2009   MCV 82.7 07/04/2009   PLT 272 07/04/2009   Lab Results  Component Value Date   NA 140 07/04/2009   K 5.0 07/04/2009   CL 108 07/04/2009    RADIOGRAPHIC STUDIES: I have personally reviewed the radiological reports and agreed with the findings in the report.  ASSESSMENT AND PLAN:  Malignant neoplasm of upper-outer quadrant of right breast in female, estrogen receptor positive ([facility]) Intermittent bloody nipple discharge in the right breast, mammogram showed asymmetry right breast upper outer quadrant ultrasound negative biopsy 05/26/2017 fibrocystic changes ;  Breast MRI revealed 5.6 x 3.4 cm non-mass enhancement, MRI guided biopsy revealed: IDC with DCIS, grade 2, ER 100%, PR 15%, Ki-67 5%, T3 N0 stage IIA AJCC 8 clinical stage  Pathology and radiology counseling:Discussed with the patient, the details of pathology including the type of breast cancer,the clinical staging, the significance of ER, PR and HER-2/neu receptors and the implications for treatment. After reviewing the pathology in detail, we proceeded to discuss the different treatment options between surgery, radiation, chemotherapy, antiestrogen therapies.  Recommendations: 1. Oncotype DX testing to determine if chemotherapy would be of any benefit followed by 2. neoadjuvant hormonal therapy versus chemotherapy 3. Breast  conserving surgery if possible followed by 4. Adjuvant radiation therapy followed by 5. Adjuvant antiestrogen therapy  Oncotype counseling: I discussed Oncotype DX test. I explained to the patient that this is a 21 gene panel to evaluate patient tumors DNA to calculate recurrence score. This would help determine whether patient has high risk or intermediate risk or low risk breast cancer. She understands that if her tumor was found to be high risk, she would benefit from systemic chemotherapy. If low risk, no need of chemotherapy. If she was found to be intermediate risk, we would need to evaluate the score as well as other risk factors and determine if an abbreviated chemotherapy may be of benefit.  Until we get the results of Oncotype testing, I recommended starting her on antiestrogen therapy with the letrozole 2.5 mg by mouth daily  Letrozole counseling:We discussed the risks and benefits of anti-estrogen therapy with aromatase inhibitors. These include but not limited to insomnia, hot flashes, mood changes, vaginal dryness, bone density loss, and weight gain. We strongly believe that the benefits far outweigh the risks. Patient understands these risks and consented to starting treatment.  After lengthy discussion, I also discussed with her that if she sees radiation oncology and decides that she does not want to undergo radiation then she will need mastectomy. In that situation there is no need to continue with neoadjuvant treatment.  All questions were answered. The patient knows to call the clinic with any problems, questions or concerns.    Rulon Eisenmenger, MD 06/25/17

## 2017-06-25 NOTE — Assessment & Plan Note (Signed)
Intermittent bloody nipple discharge in the right breast, mammogram showed asymmetry right breast upper outer quadrant ultrasound negative biopsy 05/26/2017 fibrocystic changes ;  Breast MRI revealed 5.6 x 3.4 cm non-mass enhancement, MRI guided biopsy revealed: IDC with DCIS, grade 2, ER 100%, PR 15%, Ki-67 5%, T3 N0 stage IIA AJCC 8 clinical stage  Pathology and radiology counseling:Discussed with the patient, the details of pathology including the type of breast cancer,the clinical staging, the significance of ER, PR and HER-2/neu receptors and the implications for treatment. After reviewing the pathology in detail, we proceeded to discuss the different treatment options between surgery, radiation, chemotherapy, antiestrogen therapies.  Recommendations: 1. Oncotype DX testing to determine if chemotherapy would be of any benefit followed by 2. neoadjuvant hormonal therapy versus chemotherapy 3. Breast conserving surgery if possible followed by 4. Adjuvant radiation therapy followed by 5. Adjuvant antiestrogen therapy  Oncotype counseling: I discussed Oncotype DX test. I explained to the patient that this is a 21 gene panel to evaluate patient tumors DNA to calculate recurrence score. This would help determine whether patient has high risk or intermediate risk or low risk breast cancer. She understands that if her tumor was found to be high risk, she would benefit from systemic chemotherapy. If low risk, no need of chemotherapy. If she was found to be intermediate risk, we would need to evaluate the score as well as other risk factors and determine if an abbreviated chemotherapy may be of benefit.  Until we get the results of Oncotype testing, I recommended starting her on antiestrogen therapy with the letrozole 2.5 mg by mouth daily  Letrozole counseling:We discussed the risks and benefits of anti-estrogen therapy with aromatase inhibitors. These include but not limited to insomnia, hot flashes,  mood changes, vaginal dryness, bone density loss, and weight gain. We strongly believe that the benefits far outweigh the risks. Patient understands these risks and consented to starting treatment.

## 2017-06-26 ENCOUNTER — Encounter: Payer: Self-pay | Admitting: Radiation Oncology

## 2017-06-26 ENCOUNTER — Other Ambulatory Visit: Payer: Self-pay | Admitting: Family Medicine

## 2017-06-26 DIAGNOSIS — E2839 Other primary ovarian failure: Secondary | ICD-10-CM

## 2017-07-01 NOTE — Progress Notes (Addendum)
CHIEF COMPLAINTS/PURPOSE OF CONSULTATION  Malignant neoplasm of upper-outer quadrant of right breast in female, estrogen receptor positive ([place])  Location of Breast Cancer: Rt Breast  Histology per Pathology Report:  Receptor Status: ER(100%), PR (15%), Her2-neu ( NEG), Ki-( 67of 5 %)T3 N0 stage IIA AJCC 8 clinical stage   Did patient present with symptoms (if so, please note symptoms) or was this found on screening mammography?:   She had initial mammograms that showed an asymmetry in the right breast upper outer quadrant  Past/Anticipated interventions by surgeon, if any: ( Dr. [name])  After lengthy discussion, I also discussed with her that if she sees radiation oncology and decides that she does not want to undergo radiation then she will need mastectomy. In that situation there is no need to continue with neoadjuvant treatment  Past/Anticipated interventions by medical oncology, if any: Chemotherapy   Recommendations:(Dr. [name]) 1. Oncotype DX testing to determine if chemotherapy would be of any benefit followed by 2. neoadjuvant hormonal therapy versus chemotherapy 3. Breast conserving surgery if possible followed by 4. Adjuvant radiation therapy followed by 5. Adjuvant antiestrogen therapy  After lengthy discussion, I also discussed with her that if she sees radiation oncology and decides that she does not want to undergo radiation then she will need mastectomy. In that situation there is no need to continue with neoadjuvant treatment  Lymphedema issues, if any:   None  Pain issues, if any:   No Pain  SAFETY ISSUES:  Prior radiation?  No  Pacemaker/ICD?  No  Possible current pregnancy? No  Is the patient on methotrexate?  No   Current Complaints / other details: None    Her father and brother were diagnosed with prostate cancer Vitals:   07/02/17 0748  BP: (!) 130/59  Pulse: 81  Resp: 20  Temp: 98.2 F (36.8 C)  TempSrc: Oral  SpO2: 99%   Weight: 226 lb 4 oz (102.6 kg)   Wt Readings from Last 3 Encounters:  07/02/17 226 lb 4 oz (102.6 kg)  06/25/17 226 lb (102.5 kg)         Mardene Sayer, LPN 03/29/8936,34:28 AM

## 2017-07-02 ENCOUNTER — Ambulatory Visit
Admission: RE | Admit: 2017-07-02 | Discharge: 2017-07-02 | Disposition: A | Discharge status: Home / Self Care | Payer: Medicare Other | Source: Ambulatory Visit | Attending: Radiation Oncology | Admitting: Radiation Oncology

## 2017-07-02 ENCOUNTER — Encounter: Payer: Self-pay | Admitting: Radiation Oncology

## 2017-07-02 VITALS — BP 130/59 | HR 81 | Temp 98.2°F | Resp 20 | Wt 226.2 lb

## 2017-07-02 DIAGNOSIS — C50411 Malignant neoplasm of upper-outer quadrant of right female breast: Secondary | ICD-10-CM | POA: Diagnosis not present

## 2017-07-02 DIAGNOSIS — Z7901 Long term (current) use of anticoagulants: Secondary | ICD-10-CM | POA: Insufficient documentation

## 2017-07-02 DIAGNOSIS — Z79811 Long term (current) use of aromatase inhibitors: Secondary | ICD-10-CM | POA: Diagnosis not present

## 2017-07-02 DIAGNOSIS — C50911 Malignant neoplasm of unspecified site of right female breast: Secondary | ICD-10-CM | POA: Diagnosis present

## 2017-07-02 DIAGNOSIS — Z170 Estrogen receptor positive status [ER+]: Secondary | ICD-10-CM | POA: Diagnosis not present

## 2017-07-02 DIAGNOSIS — Z8042 Family history of malignant neoplasm of prostate: Secondary | ICD-10-CM | POA: Diagnosis not present

## 2017-07-02 DIAGNOSIS — Z86711 Personal history of pulmonary embolism: Secondary | ICD-10-CM | POA: Diagnosis not present

## 2017-07-02 HISTORY — DX: Dorsalgia, unspecified: M54.9

## 2017-07-02 HISTORY — DX: Personal history of other complications of pregnancy, childbirth and the puerperium: Z87.59

## 2017-07-02 HISTORY — DX: Personal history of pulmonary embolism: Z86.711

## 2017-07-02 HISTORY — DX: Other chronic pain: G89.29

## 2017-07-02 NOTE — Progress Notes (Signed)
Radiation Oncology         ([phone]) [phone] ________________________________  Name: Brittany [name]        MRN: [id number]  Date of Service: 07/02/2017 DOB: November 06, 1945  OL:IDCVUD, Brittany Saxon, MD  [name], MD     REFERRING PHYSICIAN: [name], MD   DIAGNOSIS: The encounter diagnosis was Primary malignant neoplasm of upper outer quadrant of female breast, right ([place]).   HISTORY OF PRESENT ILLNESS: Brittany [name] is a 71 y.o. female seen at the request of Dr. Lindi Adie for a new diagnosis of right breast cancer. The patient noted bloody nipple discharge for several months and proceeded with evaluation of this and the films indicated a developing asymmetry within the outer right breast seen on compression views, and there was no ultrasound correlate. She underwent stereotactic biopsy which was consistent with fibrocystic change on 05/25/17. She underwent MRI on 06/15/17 which revealed climped non mass enhancement consistent with the area of asymmetry seen on mammography in the upper outer quadrant of the right breast, measuring 2.1 x 5.6 x 3.4 cm no other suspicious findings were noted in either breast and her nodes appeared normal sized. She did have an MRI guided byiopsy on 06/19/17 which revealed a grade 2, ER/PR positive, HER2 negative, Ki 67 5%, invasive ductal carcinoma with DCIS of the right breast. She has met with Dr. Lindi Adie and oncotype is being performed on her tumor. If she has a high risk score, she would undergo neoadjuvant chemotherapy to shrink the tumor with the hopes of breast conservation surgery followed by adjuvant radiation and endocrine therapy. She comes today to discuss the radiation component of her care. Of note she's not currently scheduled for any PAC placement or chemotherapy.    PREVIOUS RADIATION THERAPY: No   PAST MEDICAL HISTORY:  Past Medical History:  Diagnosis Date  . Chronic back pain   . History of ectopic pregnancy   . History of pulmonary embolism    anticoagulated on coumadin       PAST SURGICAL HISTORY: Past Surgical History:  Procedure Laterality Date  . TUBAL LIGATION Bilateral      FAMILY HISTORY:  Family History  Problem Relation Age of Onset  . Prostate cancer Father   . Prostate cancer Brother      SOCIAL HISTORY:  reports that she has never smoked. She has never used smokeless tobacco. She reports that she does not drink alcohol or use drugs. The patient is married and lives in [place]. She is accompanied by her husband.   ALLERGIES: Patient has no allergy information on record.   MEDICATIONS:  Current Outpatient Prescriptions  Medication Sig Dispense Refill  . anastrozole (ARIMIDEX) 1 MG tablet Take 1 tablet (1 mg total) by mouth daily. 90 tablet 3  . warfarin (COUMADIN) 5 MG tablet Take 1 tablet (5 mg total) by mouth daily. Except Tue and Thu    . warfarin (COUMADIN) 7.5 MG tablet Take 1 tablet (7.5 mg total) by mouth daily. Tue and Thu     No current facility-administered medications for this encounter.      REVIEW OF SYSTEMS: On review of systems, the patient reports that she is doing well overall. She continues to have intermittent discharge from the breast. She denies any chest pain, shortness of breath, cough, fevers, chills, night sweats, unintended weight changes. She denies any bowel or bladder disturbances, and denies abdominal pain, nausea or vomiting. She denies any new musculoskeletal or joint aches or pains. A complete review of  systems is obtained and is otherwise negative.    PHYSICAL EXAM:  Wt Readings from Last 3 Encounters:  07/02/17 226 lb 4 oz (102.6 kg)  06/25/17 226 lb (102.5 kg)   Temp Readings from Last 3 Encounters:  07/02/17 98.2 F (36.8 C) (Oral)  06/25/17 97.9 F (36.6 C) (Oral)   BP Readings from Last 3 Encounters:  07/02/17 (!) 130/59  06/25/17 139/67   Pulse Readings from Last 3 Encounters:  07/02/17 81  06/25/17 72   Pain Assessment Pain Score: 0-No  pain/10  In general this is a well appearing African American female in no acute distress. She is alert and oriented x4 and appropriate throughout the examination. HEENT reveals that the patient is normocephalic, atraumatic. EOMs are intact. PERRLA. Skin is intact without any evidence of gross lesions.  Cardiopulmonary assessment is negative for acute distress and she exhibits normal effort. Breast exam is deferred.    ECOG = 1  0 - Asymptomatic (Fully active, able to carry on all predisease activities without restriction)  1 - Symptomatic but completely ambulatory (Restricted in physically strenuous activity but ambulatory and able to carry out work of a light or sedentary nature. For example, light housework, office work)  2 - Symptomatic, <50% in bed during the day (Ambulatory and capable of all self care but unable to carry out any work activities. Up and about more than 50% of waking hours)  3 - Symptomatic, >50% in bed, but not bedbound (Capable of only limited self-care, confined to bed or chair 50% or more of waking hours)  4 - Bedbound (Completely disabled. Cannot carry on any self-care. Totally confined to bed or chair)  5 - Death   Eustace Pen MM, Creech RH, Tormey DC, et al. [phone]). "Toxicity and response criteria of the [facility]". [place] Oncol. 5 (6): 649-55    LABORATORY DATA:  Lab Results  Component Value Date   WBC 8.5 07/04/2009   HGB 22.1 (H) 07/04/2009   HCT 65.0 (H) 07/04/2009   MCV 82.7 07/04/2009   PLT 272 07/04/2009   Lab Results  Component Value Date   NA 140 07/04/2009   K 5.0 07/04/2009   CL 108 07/04/2009   No results found for: ALT, AST, GGT, ALKPHOS, BILITOT    RADIOGRAPHY: Mr Breast Bilateral W Wo Contrast  Result Date: 06/15/2017 CLINICAL DATA:  Right bloody nipple discharge. LABS:  Creatinine was obtained on site at [facility] at [address] Results: Creatinine 0.6 mg/dL. EXAM: BILATERAL BREAST MRI  WITH AND WITHOUT CONTRAST TECHNIQUE: Multiplanar, multisequence MR images of both breasts were obtained prior to and following the intravenous administration of 20 ml of MultiHance. THREE-DIMENSIONAL MR IMAGE RENDERING ON INDEPENDENT WORKSTATION: Three-dimensional MR images were rendered by post-processing of the original MR data on an independent workstation. The three-dimensional MR images were interpreted, and findings are reported in the following complete MRI report for this study. Three dimensional images were evaluated at the independent DynaCad workstation COMPARISON:  Mammogram and ultrasound studies from the [facility] 05/25/2017 and earlier FINDINGS: Breast composition: b. Scattered fibroglandular tissue. Background parenchymal enhancement: Minimal Right breast: Within the upper-outer quadrant of the right breast there is a segmental area of clumped non mass enhancement demonstrating slow progressive kinetics and similar to the area of asymmetry identified mammographically. This region measures 2.1 x 5.6 x 3.4 cm. The area is well seen on delayed postcontrast images (image 111 of series 12). A tissue  marker clip is identified at the inferior edge of this region of non mass enhancement. No other suspicious enhancement is identified in the right breast. No suspicious or asymmetric retroareolar enhancement Left breast: No mass or abnormal enhancement. Lymph nodes: No abnormal appearing lymph nodes. Ancillary findings:  None. IMPRESSION: Area of asymmetric non mass enhancement in the upper-outer quadrant of the right breast, warranting tissue diagnosis. RECOMMENDATION: MR guided core biopsy of non mass enhancement in the upper-outer quadrant of the right breast. BI-RADS CATEGORY  4: Suspicious. Electronically Signed   By: Nolon Nations M.D.   On: 06/15/2017 15:12   Mm Clip Placement Right  Result Date: 06/19/2017 CLINICAL DATA:  Post biopsy mammogram of the right breast for  clip placement. EXAM: DIAGNOSTIC RIGHT MAMMOGRAM POST MRI BIOPSY COMPARISON:  Previous exam(s). FINDINGS: Mammographic images were obtained following MRI guided biopsy of the upper-outer non mass enhancement of the right breast. The dumbbell-shaped biopsy marking clip is well positioned at the intended site of biopsy in the upper-outer right breast. The coil shaped biopsy marking clip from the prior benign biopsy is also seen slightly more inferior in the lateral right breast. IMPRESSION: The dumbbell-shaped biopsy marking clip from today's MRI biopsy is well positioned in the upper-outer quadrant of the right breast. Final Assessment: Post Procedure Mammograms for Marker Placement Electronically Signed   By: Ammie Ferrier M.D.   On: 06/19/2017 09:07   Mr Rt Breast Bx Johnella Moloney Dev 1st Lesion Image Bx Spec Mr Guide  Addendum Date: 06/22/2017   ADDENDUM REPORT: 06/22/2017 14:04 ADDENDUM: Pathology revealed GRADE II INVASIVE DUCTAL CARCINOMA, DUCTAL CARCINOMA IN SITU of the Right breast, upper outer quadrant. This was found to be concordant by Dr. Ammie Ferrier. Pathology results were discussed with the patient by telephone. The patient reported doing well after the biopsy with tenderness at the site. Post biopsy instructions and care were reviewed and questions were answered. The patient was encouraged to call [facility] for any additional concerns. Surgical consultation has been arranged with Dr. [name] at [facility] on June 22, 2017. Pathology results reported by Terie Purser, RN on 06/22/2017. Electronically Signed   By: Ammie Ferrier M.D.   On: 06/22/2017 14:04   Result Date: 06/22/2017 CLINICAL DATA:  71 year old female with history of right bloody nipple discharge presenting for MRI guided biopsy of non mass enhancement in the upper-outer right breast. EXAM: MRI GUIDED CORE NEEDLE BIOPSY OF THE RIGHT BREAST TECHNIQUE: Multiplanar, multisequence MR  imaging of the right breast was performed both before and after administration of intravenous contrast. CONTRAST:  20 mL of MultiHance COMPARISON:  Previous exams. FINDINGS: I met with the patient, and we discussed the procedure of MRI guided biopsy, including risks, benefits, and alternatives. Specifically, we discussed the risks of infection, bleeding, tissue injury, clip migration, and inadequate sampling. Informed, written consent was given. The usual time out protocol was performed immediately prior to the procedure. Using sterile technique, 1% Lidocaine, MRI guidance, and a 9 gauge vacuum assisted device, biopsy was performed of non mass enhancement in the upper-outer right breast using a lateral approach. At the conclusion of the procedure, a dumbbell-shaped tissue marker clip was deployed into the biopsy cavity. Follow-up 2-view mammogram was performed and dictated separately. IMPRESSION: MRI guided biopsy of non mass enhancement in the upper-outer right breast. No apparent complications. Electronically Signed: By: Ammie Ferrier M.D. On: 06/19/2017 09:14       IMPRESSION/PLAN: 1. Stage IIA, cT3N0M0  grade 2, ER/PR positive invasive ductal carcinoma with DCIS of the right breast. Dr. Lisbeth Renshaw discusses the pathology findings and reviews the nature of invasive breast disease. Her tumor is being tested for oncotype. If her oncotype testing is high risk, she will be encouraged to have neoadjuvant chemotherapy followed by breast conservation surgery with sentinel node evaluation depending on her response. If her oncotype score is low she may proceed with mastectomy with Dr. Dalbert Batman. We discussed the risks, benefits, short, and long term effects of radiotherapy if she had high risk features on mastectomy pathology or if she elected for breast conservation, and the patient is interested in proceeding as appropriate and as directed by the oncotype results. Dr. Lisbeth Renshaw discusses the delivery and logistics of  radiotherapy. We will see her back as medically indicated and at the appropriate time.   In a visit lasting 45 minutes, greater than 50% of the time was spent face to face discussing options of treatment as well as logistics, and coordinating the patient's care.  The above documentation reflects my direct findings during this shared patient visit. Please see the separate note by Dr. Lisbeth Renshaw on this date for the remainder of the patient's plan of care.    Carola Rhine, PAC

## 2017-07-03 ENCOUNTER — Encounter: Payer: Self-pay | Admitting: Radiation Oncology

## 2017-07-06 ENCOUNTER — Ambulatory Visit
Admission: RE | Admit: 2017-07-06 | Discharge: 2017-07-06 | Disposition: A | Discharge status: Home / Self Care | Payer: Medicare Other | Source: Ambulatory Visit | Attending: Family Medicine | Admitting: Family Medicine

## 2017-07-06 ENCOUNTER — Telehealth: Payer: Self-pay | Admitting: *Deleted

## 2017-07-06 DIAGNOSIS — Z780 Asymptomatic menopausal state: Secondary | ICD-10-CM | POA: Diagnosis not present

## 2017-07-06 DIAGNOSIS — E2839 Other primary ovarian failure: Secondary | ICD-10-CM

## 2017-07-06 DIAGNOSIS — Z13820 Encounter for screening for osteoporosis: Secondary | ICD-10-CM | POA: Diagnosis not present

## 2017-07-06 NOTE — Telephone Encounter (Signed)
Received oncotype score of 18/11%. Physician team notified. Called pt and gave results. Discussed that she does not need chemotherapy at this time and to stay on letrozole. Denies further needs or questions at this time. Contact information provided.

## 2017-07-07 ENCOUNTER — Encounter (HOSPITAL_COMMUNITY): Payer: Self-pay

## 2017-07-07 ENCOUNTER — Encounter: Payer: Self-pay | Admitting: Hematology and Oncology

## 2017-07-08 ENCOUNTER — Other Ambulatory Visit: Payer: Self-pay | Admitting: General Surgery

## 2017-07-08 DIAGNOSIS — N63 Unspecified lump in unspecified breast: Secondary | ICD-10-CM

## 2017-07-09 ENCOUNTER — Other Ambulatory Visit: Payer: Self-pay | Admitting: General Surgery

## 2017-07-09 DIAGNOSIS — C50911 Malignant neoplasm of unspecified site of right female breast: Secondary | ICD-10-CM

## 2017-07-14 ENCOUNTER — Ambulatory Visit
Admission: RE | Admit: 2017-07-14 | Discharge: 2017-07-14 | Disposition: A | Discharge status: Home / Self Care | Payer: Medicare Other | Source: Ambulatory Visit | Attending: General Surgery | Admitting: General Surgery

## 2017-07-14 DIAGNOSIS — C50911 Malignant neoplasm of unspecified site of right female breast: Secondary | ICD-10-CM

## 2017-07-14 DIAGNOSIS — N6489 Other specified disorders of breast: Secondary | ICD-10-CM | POA: Diagnosis not present

## 2017-07-14 DIAGNOSIS — N6311 Unspecified lump in the right breast, upper outer quadrant: Secondary | ICD-10-CM | POA: Diagnosis not present

## 2017-07-14 HISTORY — PX: BREAST BIOPSY: SHX20

## 2017-07-14 MED ORDER — GADOBENATE DIMEGLUMINE 529 MG/ML IV SOLN: 20.0000 mL | Freq: Once | INTRAVENOUS | Status: DC | PRN

## 2017-07-23 DIAGNOSIS — Z7901 Long term (current) use of anticoagulants: Secondary | ICD-10-CM | POA: Diagnosis not present

## 2017-07-23 DIAGNOSIS — I8291 Chronic embolism and thrombosis of unspecified vein: Secondary | ICD-10-CM | POA: Diagnosis not present

## 2017-08-06 DIAGNOSIS — Z7901 Long term (current) use of anticoagulants: Secondary | ICD-10-CM | POA: Diagnosis not present

## 2017-08-06 DIAGNOSIS — I8291 Chronic embolism and thrombosis of unspecified vein: Secondary | ICD-10-CM | POA: Diagnosis not present

## 2017-08-11 DIAGNOSIS — Z9851 Tubal ligation status: Secondary | ICD-10-CM | POA: Diagnosis not present

## 2017-08-11 DIAGNOSIS — Z8759 Personal history of other complications of pregnancy, childbirth and the puerperium: Secondary | ICD-10-CM | POA: Diagnosis not present

## 2017-08-11 DIAGNOSIS — N6489 Other specified disorders of breast: Secondary | ICD-10-CM | POA: Diagnosis not present

## 2017-08-11 DIAGNOSIS — Z5181 Encounter for therapeutic drug level monitoring: Secondary | ICD-10-CM | POA: Diagnosis not present

## 2017-08-11 DIAGNOSIS — Z86711 Personal history of pulmonary embolism: Secondary | ICD-10-CM | POA: Diagnosis not present

## 2017-08-11 DIAGNOSIS — Z7901 Long term (current) use of anticoagulants: Secondary | ICD-10-CM | POA: Diagnosis not present

## 2017-08-11 DIAGNOSIS — C50411 Malignant neoplasm of upper-outer quadrant of right female breast: Secondary | ICD-10-CM | POA: Diagnosis not present

## 2017-09-10 DIAGNOSIS — I8291 Chronic embolism and thrombosis of unspecified vein: Secondary | ICD-10-CM | POA: Diagnosis not present

## 2017-09-10 DIAGNOSIS — Z7901 Long term (current) use of anticoagulants: Secondary | ICD-10-CM | POA: Diagnosis not present

## 2017-09-23 ENCOUNTER — Telehealth: Payer: Self-pay

## 2017-09-23 NOTE — Telephone Encounter (Signed)
Patient left VM requesting a later appointment that the one previously scheduled. Patient made aware of availability of earlier appointments. Appointment amended per patient request.

## 2017-09-24 DIAGNOSIS — Z7901 Long term (current) use of anticoagulants: Secondary | ICD-10-CM | POA: Diagnosis not present

## 2017-09-24 DIAGNOSIS — I8291 Chronic embolism and thrombosis of unspecified vein: Secondary | ICD-10-CM | POA: Diagnosis not present

## 2017-09-25 ENCOUNTER — Ambulatory Visit: Payer: Medicare Other | Admitting: Hematology and Oncology

## 2017-09-25 ENCOUNTER — Ambulatory Visit (HOSPITAL_BASED_OUTPATIENT_CLINIC_OR_DEPARTMENT_OTHER): Payer: Medicare Other | Admitting: Hematology and Oncology

## 2017-09-25 DIAGNOSIS — Z170 Estrogen receptor positive status [ER+]: Secondary | ICD-10-CM | POA: Diagnosis not present

## 2017-09-25 DIAGNOSIS — C50411 Malignant neoplasm of upper-outer quadrant of right female breast: Secondary | ICD-10-CM | POA: Diagnosis not present

## 2017-09-25 DIAGNOSIS — Z79811 Long term (current) use of aromatase inhibitors: Secondary | ICD-10-CM | POA: Diagnosis not present

## 2017-09-25 NOTE — Assessment & Plan Note (Signed)
Intermittent bloody nipple discharge in the right breast, mammogram showed asymmetry right breast upper outer quadrant ultrasound negative biopsy 05/26/2017 fibrocystic changes ;  Breast MRI revealed 5.6 x 3.4 cm non-mass enhancement, MRI guided biopsy revealed: IDC with DCIS, grade 2, ER 100%, PR 15%, Ki-67 5%, T3 N0 stage IIA AJCC 8 clinical stage  Oncotype DX score 18: 11% risk of recurrence  Treatment plan: 1. neoadjuvant hormonal therapy versus chemotherapy 2. Breast conserving surgery if possible followed by 3. Adjuvant radiation therapy followed by 4. Adjuvant antiestrogen therapy   Current treatment: Neoadjuvant antiestrogen therapy with letrozole 2.5 mg daily  Letrozole toxicities:  Since Brittany [name] completed 3 months of therapy, I would like to obtain a mammogram and ultrasound interim evaluation on neoadjuvant therapy.

## 2017-09-25 NOTE — Progress Notes (Signed)
Patient Care Team: [name], MD as PCP - General (Family Medicine)  DIAGNOSIS:  Encounter Diagnosis  Name Primary?  . Malignant neoplasm of upper-outer quadrant of right breast in female, estrogen receptor positive ([place])     SUMMARY OF ONCOLOGIC HISTORY:   Malignant neoplasm of upper-outer quadrant of right breast in female, estrogen receptor positive ([place])   06/19/2017 Initial Diagnosis    Intermittent bloody nipple discharge in the right breast, mammogram showed asymmetry right breast upper outer quadrant ultrasound negative biopsy 05/26/2017 fibrocystic changes ; breast MRI revealed 5.6 x 3.4 cm non-mass enhancement, MRI guided biopsy revealed: IDC with DCIS, grade 2, ER 100%, PR 15%, Ki-67 5%, T3 N0 stage IIA AJCC 8 clinical stage      07/03/2017 Oncotype testing    Oncotype DX score 18: Risk of recurrence 11%      07/03/2017 -  Anti-estrogen oral therapy    Letrozole 2.5 mg daily neoadjuvant hormone therapy       CHIEF COMPLIANT: Follow-up on neoadjuvant letrozole therapy  INTERVAL HISTORY: Brittany [name] is a 71 year old with above-mentioned history of right breast cancer currently on neoadjuvant antiestrogen therapy with letrozole.  She is tolerating letrozole fairly well.  She does have occasional hot flashes.  She denies any arthralgias or myalgias.  She had post biopsy lumps and discomfort which are getting better over time.  She had completed 3 months of antiestrogen therapy.  REVIEW OF SYSTEMS:   Constitutional: Denies fevers, chills or abnormal weight loss Eyes: Denies blurriness of vision Ears, nose, mouth, throat, and face: Denies mucositis or sore throat Respiratory: Denies cough, dyspnea or wheezes Cardiovascular: Denies palpitation, chest discomfort Gastrointestinal:  Denies nausea, heartburn or change in bowel habits Skin: Denies abnormal skin rashes Lymphatics: Denies new lymphadenopathy or easy bruising Neurological:Denies numbness, tingling or new  weaknesses Behavioral/Psych: Mood is stable, no new changes  Extremities: No lower extremity edema Breast: Postbiopsy breast discomfort and lumps All other systems were reviewed with the patient and are negative.  I have reviewed the past medical history, past surgical history, social history and family history with the patient and they are unchanged from previous note.  ALLERGIES:  has no allergies on file.  MEDICATIONS:  Current Outpatient Medications  Medication Sig Dispense Refill  . anastrozole (ARIMIDEX) 1 MG tablet Take 1 tablet (1 mg total) by mouth daily. 90 tablet 3  . warfarin (COUMADIN) 5 MG tablet Take 1 tablet (5 mg total) by mouth daily. Except Tue and Thu    . warfarin (COUMADIN) 7.5 MG tablet Take 1 tablet (7.5 mg total) by mouth daily. Tue and Thu     No current facility-administered medications for this visit.     PHYSICAL EXAMINATION: ECOG PERFORMANCE STATUS: 1 - Symptomatic but completely ambulatory  Vitals:   09/25/17 0823  BP: 132/80  Pulse: 86  Resp: 18  Temp: 97.7 F (36.5 C)  SpO2: 100%   Filed Weights   09/25/17 0823  Weight: 229 lb 4.8 oz (104 kg)    GENERAL:alert, no distress and comfortable SKIN: skin color, texture, turgor are normal, no rashes or significant lesions EYES: normal, Conjunctiva are pink and non-injected, sclera clear OROPHARYNX:no exudate, no erythema and lips, buccal mucosa, and tongue normal  NECK: supple, thyroid normal size, non-tender, without nodularity LYMPH:  no palpable lymphadenopathy in the cervical, axillary or inguinal LUNGS: clear to auscultation and percussion with normal breathing effort HEART: regular rate & rhythm and no murmurs and no lower extremity edema ABDOMEN:abdomen soft,  non-tender and normal bowel sounds MUSCULOSKELETAL:no cyanosis of digits and no clubbing  NEURO: alert & oriented x 3 with fluent speech, no focal motor/sensory deficits EXTREMITIES: No lower extremity edema   LABORATORY DATA:    I have reviewed the data as listed   Chemistry      Component Value Date/Time   NA 140 07/04/2009 2334   K 5.0 07/04/2009 2334   CL 108 07/04/2009 2334   BUN 20 07/04/2009 2334   CREATININE 0.8 07/04/2009 2334   No results found for: CALCIUM, ALKPHOS, AST, ALT, BILITOT     Lab Results  Component Value Date   WBC 8.5 07/04/2009   HGB 22.1 (H) 07/04/2009   HCT 65.0 (H) 07/04/2009   MCV 82.7 07/04/2009   PLT 272 07/04/2009   NEUTROABS 5.1 07/04/2009    ASSESSMENT & PLAN:  Malignant neoplasm of upper-outer quadrant of right breast in female, estrogen receptor positive ([facility]) Intermittent bloody nipple discharge in the right breast, mammogram showed asymmetry right breast upper outer quadrant ultrasound negative biopsy 05/26/2017 fibrocystic changes ;  Breast MRI revealed 5.6 x 3.4 cm non-mass enhancement, MRI guided biopsy revealed: IDC with DCIS, grade 2, ER 100%, PR 15%, Ki-67 5%, T3 N0 stage IIA AJCC 8 clinical stage  Oncotype DX score 18: 11% risk of recurrence  Treatment plan: 1. neoadjuvant hormonal therapy versus chemotherapy 2. Breast conserving surgery if possible followed by 3. Adjuvant radiation therapy followed by 4. Adjuvant antiestrogen therapy   Current treatment: Neoadjuvant antiestrogen therapy with letrozole 2.5 mg daily  Letrozole toxicities: Does have intermittent hot flashes but not severe enough. Denies any arthralgias or myalgias.  Since she completed 3 months of therapy, I would like to obtain a mammogram and ultrasound interim evaluation on neoadjuvant therapy. As long as she is responding to neoadjuvant therapy, we will continue 3 more months of the hormone therapy and then obtain a breast MRI. If however this mammogram and ultrasound show progression then we will need to have her undergo surgery immediately.  I spent 25 minutes talking to the patient of which more than half was spent in counseling and coordination of care.  Orders Placed This  Encounter  Procedures  . MM DIAG BREAST TOMO UNI RIGHT    Standing Status:   Future    Standing Expiration Date:   09/25/2018    Order Specific Question:   Reason for Exam (SYMPTOM  OR DIAGNOSIS REQUIRED)    Answer:   Right breast cancer on neoadj hormonal therapy    Order Specific Question:   Preferred imaging location?    Answer:   [facility]  . US BREAST LTD UNI RIGHT INC AXILLA    Standing Status:   Future    Standing Expiration Date:   11/25/2018    Order Specific Question:   Reason for Exam (SYMPTOM  OR DIAGNOSIS REQUIRED)    Answer:   Right breast cancer on neoadj hormonal therapy    Order Specific Question:   Preferred imaging location?    Answer:   [facility]  . MR BREAST BILATERAL W WO CONTRAST    Standing Status:   Future    Standing Expiration Date:   12/24/2017    Order Specific Question:   If indicated for the ordered procedure, I authorize the administration of contrast media per Radiology protocol    Answer:   Yes    Order Specific Question:   What is the patient's sedation requirement?    Answer:   No  Sedation    Order Specific Question:   Does the patient have a pacemaker or implanted devices?    Answer:   No    Order Specific Question:   Radiology Contrast Protocol - do NOT remove file path    Answer:   [email or web address]    Order Specific Question:   Reason for Exam additional comments    Answer:   Post neoadjuvant hormonal therapy    Order Specific Question:   Preferred imaging location?    Answer:   [facility] (table limit-350 lbs)   The patient has a good understanding of the overall plan. she agrees with it. she will call with any problems that may develop before the next visit here.   Rulon Eisenmenger, MD 09/25/17

## 2017-09-26 ENCOUNTER — Telehealth: Payer: Self-pay

## 2017-09-26 NOTE — Telephone Encounter (Signed)
Have already scheduled returned visit, called breast center they will contact patient with agreeable appointment time. MRI is in [employer]. Per 11/30 los

## 2017-09-26 NOTE — Telephone Encounter (Signed)
Spoke with patient concerning upcoming appointment in March. Per 11/30 los

## 2017-09-30 ENCOUNTER — Ambulatory Visit
Admission: RE | Admit: 2017-09-30 | Discharge: 2017-09-30 | Disposition: A | Discharge status: Home / Self Care | Payer: Medicare Other | Source: Ambulatory Visit | Attending: Hematology and Oncology | Admitting: Hematology and Oncology

## 2017-09-30 DIAGNOSIS — R928 Other abnormal and inconclusive findings on diagnostic imaging of breast: Secondary | ICD-10-CM | POA: Diagnosis not present

## 2017-09-30 DIAGNOSIS — C50411 Malignant neoplasm of upper-outer quadrant of right female breast: Secondary | ICD-10-CM

## 2017-09-30 DIAGNOSIS — Z170 Estrogen receptor positive status [ER+]: Principal | ICD-10-CM

## 2017-09-30 DIAGNOSIS — N6489 Other specified disorders of breast: Secondary | ICD-10-CM | POA: Diagnosis not present

## 2017-10-23 DIAGNOSIS — Z7901 Long term (current) use of anticoagulants: Secondary | ICD-10-CM | POA: Diagnosis not present

## 2017-10-23 DIAGNOSIS — I8291 Chronic embolism and thrombosis of unspecified vein: Secondary | ICD-10-CM | POA: Diagnosis not present

## 2017-11-16 ENCOUNTER — Other Ambulatory Visit: Payer: Self-pay

## 2017-11-16 MED ORDER — ANASTROZOLE 1 MG PO TABS: 1.0000 mg | ORAL_TABLET | Freq: Every day | ORAL | 3 refills | Status: DC

## 2017-11-16 MED ORDER — ANASTROZOLE 1 MG PO TABS: 1.0000 mg | ORAL_TABLET | Freq: Every day | ORAL | 2 refills | Status: DC

## 2017-11-16 NOTE — Telephone Encounter (Signed)
Left pt VM regarding to her request to have her Anastrozole switched to the [facility] [address].  Left her message to let her know her request was completed.

## 2017-11-20 DIAGNOSIS — I8291 Chronic embolism and thrombosis of unspecified vein: Secondary | ICD-10-CM | POA: Diagnosis not present

## 2017-11-20 DIAGNOSIS — Z7901 Long term (current) use of anticoagulants: Secondary | ICD-10-CM | POA: Diagnosis not present

## 2017-12-03 ENCOUNTER — Telehealth: Payer: Self-pay | Admitting: Hematology and Oncology

## 2017-12-03 NOTE — Telephone Encounter (Signed)
CME - moved 3/1 appointments to 3/4. Spoke with patient she is aware.

## 2017-12-16 ENCOUNTER — Ambulatory Visit
Admission: RE | Admit: 2017-12-16 | Discharge: 2017-12-16 | Disposition: A | Discharge status: Home / Self Care | Payer: Medicare Other | Source: Ambulatory Visit | Attending: Hematology and Oncology | Admitting: Hematology and Oncology

## 2017-12-16 DIAGNOSIS — Z170 Estrogen receptor positive status [ER+]: Principal | ICD-10-CM

## 2017-12-16 DIAGNOSIS — N6310 Unspecified lump in the right breast, unspecified quadrant: Secondary | ICD-10-CM | POA: Diagnosis not present

## 2017-12-16 DIAGNOSIS — C50411 Malignant neoplasm of upper-outer quadrant of right female breast: Secondary | ICD-10-CM

## 2017-12-16 MED ORDER — GADOBENATE DIMEGLUMINE 529 MG/ML IV SOLN
20.0000 mL | Freq: Once | INTRAVENOUS | Status: AC | PRN
  Administered 2017-12-16: 20 mL via INTRAVENOUS

## 2017-12-18 DIAGNOSIS — Z7901 Long term (current) use of anticoagulants: Secondary | ICD-10-CM | POA: Diagnosis not present

## 2017-12-18 DIAGNOSIS — I8291 Chronic embolism and thrombosis of unspecified vein: Secondary | ICD-10-CM | POA: Diagnosis not present

## 2017-12-25 ENCOUNTER — Other Ambulatory Visit: Payer: Self-pay | Admitting: *Deleted

## 2017-12-25 ENCOUNTER — Ambulatory Visit: Payer: Medicare Other | Admitting: Hematology and Oncology

## 2017-12-25 ENCOUNTER — Other Ambulatory Visit: Payer: Medicare Other

## 2017-12-25 DIAGNOSIS — Z170 Estrogen receptor positive status [ER+]: Principal | ICD-10-CM

## 2017-12-25 DIAGNOSIS — C50411 Malignant neoplasm of upper-outer quadrant of right female breast: Secondary | ICD-10-CM

## 2017-12-28 ENCOUNTER — Inpatient Hospital Stay (HOSPITAL_BASED_OUTPATIENT_CLINIC_OR_DEPARTMENT_OTHER): Payer: Medicare Other | Admitting: Hematology and Oncology

## 2017-12-28 ENCOUNTER — Other Ambulatory Visit: Payer: Self-pay

## 2017-12-28 ENCOUNTER — Inpatient Hospital Stay: Payer: Medicare Other | Attending: Hematology and Oncology

## 2017-12-28 DIAGNOSIS — Z170 Estrogen receptor positive status [ER+]: Secondary | ICD-10-CM | POA: Insufficient documentation

## 2017-12-28 DIAGNOSIS — C50411 Malignant neoplasm of upper-outer quadrant of right female breast: Secondary | ICD-10-CM

## 2017-12-28 DIAGNOSIS — N6489 Other specified disorders of breast: Secondary | ICD-10-CM | POA: Diagnosis not present

## 2017-12-28 DIAGNOSIS — Z7901 Long term (current) use of anticoagulants: Secondary | ICD-10-CM | POA: Diagnosis not present

## 2017-12-28 DIAGNOSIS — Z86711 Personal history of pulmonary embolism: Secondary | ICD-10-CM | POA: Diagnosis not present

## 2017-12-28 DIAGNOSIS — Z79811 Long term (current) use of aromatase inhibitors: Secondary | ICD-10-CM | POA: Diagnosis not present

## 2017-12-28 DIAGNOSIS — Z9851 Tubal ligation status: Secondary | ICD-10-CM | POA: Diagnosis not present

## 2017-12-28 DIAGNOSIS — Z8759 Personal history of other complications of pregnancy, childbirth and the puerperium: Secondary | ICD-10-CM | POA: Diagnosis not present

## 2017-12-28 LAB — CMP (CANCER CENTER ONLY)
ALK PHOS: 84 U/L (ref 40–150)
ALT: 24 U/L (ref 0–55)
AST: 20 U/L (ref 5–34)
Albumin: 3.5 g/dL (ref 3.5–5.0)
Anion gap: 9 (ref 3–11)
BILIRUBIN TOTAL: 0.4 mg/dL (ref 0.2–1.2)
BUN: 11 mg/dL (ref 7–26)
CALCIUM: 9.4 mg/dL (ref 8.4–10.4)
CO2: 25 mmol/L (ref 22–29)
Chloride: 110 mmol/L — ABNORMAL HIGH (ref 98–109)
Creatinine: 0.83 mg/dL (ref 0.60–1.10)
Glucose, Bld: 99 mg/dL (ref 70–140)
Potassium: 4.6 mmol/L (ref 3.5–5.1)
Sodium: 144 mmol/L (ref 136–145)
TOTAL PROTEIN: 7 g/dL (ref 6.4–8.3)

## 2017-12-28 LAB — CBC WITH DIFFERENTIAL (CANCER CENTER ONLY)
BASOS ABS: 0 10*3/uL (ref 0.0–0.1)
Basophils Relative: 0 %
Eosinophils Absolute: 0.1 10*3/uL (ref 0.0–0.5)
Eosinophils Relative: 2 %
HEMATOCRIT: 37.2 % (ref 34.8–46.6)
HEMOGLOBIN: 11.4 g/dL — AB (ref 11.6–15.9)
Lymphocytes Relative: 32 %
Lymphs Abs: 2.4 10*3/uL (ref 0.9–3.3)
MCH: 25.1 pg (ref 25.1–34.0)
MCHC: 30.6 g/dL — ABNORMAL LOW (ref 31.5–36.0)
MCV: 81.9 fL (ref 79.5–101.0)
Monocytes Absolute: 0.4 10*3/uL (ref 0.1–0.9)
Monocytes Relative: 6 %
NEUTROS ABS: 4.4 10*3/uL (ref 1.5–6.5)
NEUTROS PCT: 60 %
Platelet Count: 289 10*3/uL (ref 145–400)
RBC: 4.54 MIL/uL (ref 3.70–5.45)
RDW: 15.1 % — AB (ref 11.2–14.5)
WBC Count: 7.3 10*3/uL (ref 3.9–10.3)

## 2017-12-28 NOTE — Assessment & Plan Note (Signed)
Intermittent bloody nipple discharge in the right breast, mammogram showed asymmetry right breast upper outer quadrant ultrasound negative biopsy 05/26/2017 fibrocystic changes ; Breast MRI revealed 5.6 x 3.4 cm non-mass enhancement, MRI guided biopsy revealed: IDC with DCIS, grade 2, ER 100%, PR 15%, Ki-67 5%, T3 N0 stage IIA AJCC 8 clinical stage  Oncotype DX score 18: 11% risk of recurrence  Treatment plan: 1. neoadjuvant hormonal therapy versus chemotherapy 2.Breast conserving surgeryif possiblefollowed by 3. Adjuvant radiation therapy followed by 4. Adjuvant antiestrogen therapy -------------------------------------------------------------------------- Current treatment: Neoadjuvant antiestrogen therapy with letrozole 2.5 mg daily  Letrozole toxicities: Does have intermittent hot flashes but not severe enough. Denies any arthralgias or myalgias.  MRI breast 12/16/2017: Interval decrease in the linearly oriented clumped non-mass enhancement with small amount of faint residual enhancement measuring 3 cm  Radiology review: I discussed with her that she has had an excellent response to hormonal therapy.  We will present her in the tumor board and she will see surgery to discuss  Breast conserving surgery.  Return to clinic after surgery to discuss further adjuvant treatment plan

## 2017-12-28 NOTE — Progress Notes (Signed)
Patient Care Team: [name], MD as PCP - General (Family Medicine)  DIAGNOSIS:  Encounter Diagnosis  Name Primary?  . Malignant neoplasm of upper-outer quadrant of right breast in female, estrogen receptor positive ([place])     SUMMARY OF ONCOLOGIC HISTORY:   Malignant neoplasm of upper-outer quadrant of right breast in female, estrogen receptor positive ([place])   06/19/2017 Initial Diagnosis    Intermittent bloody nipple discharge in the right breast, mammogram showed asymmetry right breast upper outer quadrant ultrasound negative biopsy 05/26/2017 fibrocystic changes ; breast MRI revealed 5.6 x 3.4 cm non-mass enhancement, MRI guided biopsy revealed: IDC with DCIS, grade 2, ER 100%, PR 15%, Ki-67 5%, T3 N0 stage IIA AJCC 8 clinical stage      07/03/2017 Oncotype testing    Oncotype DX score 18: Risk of recurrence 11%      07/03/2017 -  Anti-estrogen oral therapy    Letrozole 2.5 mg daily neoadjuvant hormone therapy       CHIEF COMPLIANT: Follow-up to review the breast MRI result  INTERVAL HISTORY: DOLOROS KWOLEK is a 72 year old with above-mentioned history of right breast cancer underwent neoadjuvant antiestrogen therapy and is she is here today to review the final MRI.  She has tolerating the treatment fairly well.  REVIEW OF SYSTEMS:   Constitutional: Denies fevers, chills or abnormal weight loss Eyes: Denies blurriness of vision Ears, nose, mouth, throat, and face: Denies mucositis or sore throat Respiratory: Denies cough, dyspnea or wheezes Cardiovascular: Denies palpitation, chest discomfort Gastrointestinal:  Denies nausea, heartburn or change in bowel habits Skin: Denies abnormal skin rashes Lymphatics: Denies new lymphadenopathy or easy bruising Neurological:Denies numbness, tingling or new weaknesses Behavioral/Psych: Mood is stable, no new changes  Extremities: No lower extremity edema Breast:  denies any pain or lumps or nodules in either breasts All other  systems were reviewed with the patient and are negative.  I have reviewed the past medical history, past surgical history, social history and family history with the patient and they are unchanged from previous note.  ALLERGIES:  has no allergies on file.  MEDICATIONS:  Current Outpatient Medications  Medication Sig Dispense Refill  . anastrozole (ARIMIDEX) 1 MG tablet Take 1 tablet (1 mg total) by mouth daily. 90 tablet 2  . warfarin (COUMADIN) 5 MG tablet Take 1 tablet (5 mg total) by mouth daily. Except Tue and Thu    . warfarin (COUMADIN) 7.5 MG tablet Take 1 tablet (7.5 mg total) by mouth daily. Tue and Thu     No current facility-administered medications for this visit.     PHYSICAL EXAMINATION: ECOG PERFORMANCE STATUS: 1 - Symptomatic but completely ambulatory  Vitals:   12/28/17 0917  BP: 137/69  Pulse: 77  Resp: 17  Temp: 98.1 F (36.7 C)  SpO2: 100%   Filed Weights   12/28/17 0917  Weight: 217 lb 14.4 oz (98.8 kg)    GENERAL:alert, no distress and comfortable SKIN: skin color, texture, turgor are normal, no rashes or significant lesions EYES: normal, Conjunctiva are pink and non-injected, sclera clear OROPHARYNX:no exudate, no erythema and lips, buccal mucosa, and tongue normal  NECK: supple, thyroid normal size, non-tender, without nodularity LYMPH:  no palpable lymphadenopathy in the cervical, axillary or inguinal LUNGS: clear to auscultation and percussion with normal breathing effort HEART: regular rate & rhythm and no murmurs and no lower extremity edema ABDOMEN:abdomen soft, non-tender and normal bowel sounds MUSCULOSKELETAL:no cyanosis of digits and no clubbing  NEURO: alert & oriented x 3 with  fluent speech, no focal motor/sensory deficits EXTREMITIES: No lower extremity edema  LABORATORY DATA:  I have reviewed the data as listed CMP Latest Ref Rng & Units 07/04/2009  Glucose 70 - 99 mg/dL 85  BUN 6 - 23 mg/dL 20  Creatinine 0.4 - 1.2 mg/dL 0.8    Sodium 135 - 145 mEq/L 140  Potassium 3.5 - 5.1 mEq/L 5.0  Chloride 96 - 112 mEq/L 108    Lab Results  Component Value Date   WBC 7.3 12/28/2017   HGB 22.1 (H) 07/04/2009   HCT 37.2 12/28/2017   MCV 81.9 12/28/2017   PLT 289 12/28/2017   NEUTROABS 4.4 12/28/2017    ASSESSMENT & PLAN:  Malignant neoplasm of upper-outer quadrant of right breast in female, estrogen receptor positive ([facility]) Intermittent bloody nipple discharge in the right breast, mammogram showed asymmetry right breast upper outer quadrant ultrasound negative biopsy 05/26/2017 fibrocystic changes ; Breast MRI revealed 5.6 x 3.4 cm non-mass enhancement, MRI guided biopsy revealed: IDC with DCIS, grade 2, ER 100%, PR 15%, Ki-67 5%, T3 N0 stage IIA AJCC 8 clinical stage  Oncotype DX score 18: 11% risk of recurrence  Treatment plan: 1. neoadjuvant hormonal therapy versus chemotherapy 2.Breast conserving surgeryif possiblefollowed by 3. Adjuvant radiation therapy followed by 4. Adjuvant antiestrogen therapy -------------------------------------------------------------------------- Current treatment: Neoadjuvant antiestrogen therapy with letrozole 2.5 mg daily  Letrozole toxicities: Does have intermittent hot flashes but not severe enough. Denies any arthralgias or myalgias.  MRI breast 12/16/2017: Interval decrease in the linearly oriented clumped non-mass enhancement with small amount of faint residual enhancement measuring 3 cm  Radiology review: I discussed with her that she has had an excellent response to hormonal therapy.  We will present her in the tumor board and she will see surgery to discuss Breast conserving surgery. She is seeing Dr. Dalbert Batman today. Return to clinic after surgery to discuss further adjuvant treatment plan   I spent 25 minutes talking to the patient of which more than half was spent in counseling and coordination of care.  No orders of the defined types were placed in this  encounter.  The patient has a good understanding of the overall plan. she agrees with it. she will call with any problems that may develop before the next visit here.   Harriette Ohara, MD 12/28/17

## 2018-01-15 DIAGNOSIS — I8291 Chronic embolism and thrombosis of unspecified vein: Secondary | ICD-10-CM | POA: Diagnosis not present

## 2018-01-15 DIAGNOSIS — Z7901 Long term (current) use of anticoagulants: Secondary | ICD-10-CM | POA: Diagnosis not present

## 2018-01-29 DIAGNOSIS — Z7901 Long term (current) use of anticoagulants: Secondary | ICD-10-CM | POA: Diagnosis not present

## 2018-01-29 DIAGNOSIS — I8291 Chronic embolism and thrombosis of unspecified vein: Secondary | ICD-10-CM | POA: Diagnosis not present

## 2018-02-03 DIAGNOSIS — Z8759 Personal history of other complications of pregnancy, childbirth and the puerperium: Secondary | ICD-10-CM | POA: Diagnosis not present

## 2018-02-03 DIAGNOSIS — Z7901 Long term (current) use of anticoagulants: Secondary | ICD-10-CM | POA: Diagnosis not present

## 2018-02-03 DIAGNOSIS — Z86711 Personal history of pulmonary embolism: Secondary | ICD-10-CM | POA: Diagnosis not present

## 2018-02-03 DIAGNOSIS — C50411 Malignant neoplasm of upper-outer quadrant of right female breast: Secondary | ICD-10-CM | POA: Diagnosis not present

## 2018-02-03 DIAGNOSIS — N6489 Other specified disorders of breast: Secondary | ICD-10-CM | POA: Diagnosis not present

## 2018-02-03 DIAGNOSIS — Z9851 Tubal ligation status: Secondary | ICD-10-CM | POA: Diagnosis not present

## 2018-02-26 DIAGNOSIS — I8291 Chronic embolism and thrombosis of unspecified vein: Secondary | ICD-10-CM | POA: Diagnosis not present

## 2018-02-26 DIAGNOSIS — Z7901 Long term (current) use of anticoagulants: Secondary | ICD-10-CM | POA: Diagnosis not present

## 2018-03-29 DIAGNOSIS — I8291 Chronic embolism and thrombosis of unspecified vein: Secondary | ICD-10-CM | POA: Diagnosis not present

## 2018-03-29 DIAGNOSIS — Z7901 Long term (current) use of anticoagulants: Secondary | ICD-10-CM | POA: Diagnosis not present

## 2018-04-15 ENCOUNTER — Other Ambulatory Visit: Payer: Self-pay | Admitting: General Surgery

## 2018-04-15 DIAGNOSIS — C50411 Malignant neoplasm of upper-outer quadrant of right female breast: Secondary | ICD-10-CM

## 2018-04-19 ENCOUNTER — Ambulatory Visit
Admission: RE | Admit: 2018-04-19 | Discharge: 2018-04-19 | Disposition: A | Discharge status: Home / Self Care | Payer: Medicare Other | Source: Ambulatory Visit | Attending: General Surgery | Admitting: General Surgery

## 2018-04-19 DIAGNOSIS — R928 Other abnormal and inconclusive findings on diagnostic imaging of breast: Secondary | ICD-10-CM | POA: Diagnosis not present

## 2018-04-19 DIAGNOSIS — C50411 Malignant neoplasm of upper-outer quadrant of right female breast: Secondary | ICD-10-CM

## 2018-04-26 DIAGNOSIS — Z7901 Long term (current) use of anticoagulants: Secondary | ICD-10-CM | POA: Diagnosis not present

## 2018-04-26 DIAGNOSIS — I8291 Chronic embolism and thrombosis of unspecified vein: Secondary | ICD-10-CM | POA: Diagnosis not present

## 2018-04-28 ENCOUNTER — Other Ambulatory Visit: Payer: Self-pay | Admitting: General Surgery

## 2018-04-28 DIAGNOSIS — Z853 Personal history of malignant neoplasm of breast: Secondary | ICD-10-CM

## 2018-05-13 ENCOUNTER — Other Ambulatory Visit: Payer: Self-pay | Admitting: General Surgery

## 2018-05-13 DIAGNOSIS — Z9851 Tubal ligation status: Secondary | ICD-10-CM | POA: Diagnosis not present

## 2018-05-13 DIAGNOSIS — C50411 Malignant neoplasm of upper-outer quadrant of right female breast: Secondary | ICD-10-CM | POA: Diagnosis not present

## 2018-05-13 DIAGNOSIS — Z7901 Long term (current) use of anticoagulants: Secondary | ICD-10-CM | POA: Diagnosis not present

## 2018-05-13 DIAGNOSIS — N6489 Other specified disorders of breast: Secondary | ICD-10-CM | POA: Diagnosis not present

## 2018-05-13 DIAGNOSIS — Z8759 Personal history of other complications of pregnancy, childbirth and the puerperium: Secondary | ICD-10-CM | POA: Diagnosis not present

## 2018-05-13 DIAGNOSIS — Z86711 Personal history of pulmonary embolism: Secondary | ICD-10-CM | POA: Diagnosis not present

## 2018-05-18 ENCOUNTER — Telehealth: Payer: Self-pay | Admitting: Hematology and Oncology

## 2018-05-18 NOTE — Telephone Encounter (Signed)
Mailed patient calendar of upcoming aug appts per 7/22 sch message

## 2018-05-24 ENCOUNTER — Encounter: Payer: Self-pay | Admitting: Radiation Oncology

## 2018-05-27 DIAGNOSIS — Z7901 Long term (current) use of anticoagulants: Secondary | ICD-10-CM | POA: Diagnosis not present

## 2018-05-27 DIAGNOSIS — I8291 Chronic embolism and thrombosis of unspecified vein: Secondary | ICD-10-CM | POA: Diagnosis not present

## 2018-05-27 HISTORY — PX: BREAST LUMPECTOMY: SHX2

## 2018-05-28 ENCOUNTER — Ambulatory Visit
Admission: RE | Admit: 2018-05-28 | Discharge: 2018-05-28 | Disposition: A | Discharge status: Home / Self Care | Payer: Medicare Other | Source: Ambulatory Visit | Attending: General Surgery | Admitting: General Surgery

## 2018-05-28 ENCOUNTER — Other Ambulatory Visit: Payer: Self-pay | Admitting: General Surgery

## 2018-05-28 DIAGNOSIS — R928 Other abnormal and inconclusive findings on diagnostic imaging of breast: Secondary | ICD-10-CM | POA: Diagnosis not present

## 2018-05-28 DIAGNOSIS — Z853 Personal history of malignant neoplasm of breast: Secondary | ICD-10-CM | POA: Diagnosis not present

## 2018-06-01 ENCOUNTER — Encounter (HOSPITAL_BASED_OUTPATIENT_CLINIC_OR_DEPARTMENT_OTHER): Payer: Self-pay | Admitting: *Deleted

## 2018-06-04 ENCOUNTER — Encounter (HOSPITAL_BASED_OUTPATIENT_CLINIC_OR_DEPARTMENT_OTHER)
Admission: RE | Admit: 2018-06-04 | Discharge: 2018-06-04 | Disposition: A | Discharge status: Home / Self Care | Payer: Medicare Other | Source: Ambulatory Visit | Attending: General Surgery | Admitting: General Surgery

## 2018-06-04 LAB — COMPREHENSIVE METABOLIC PANEL
ALT: 14 U/L (ref 0–44)
ANION GAP: 8 (ref 5–15)
AST: 18 U/L (ref 15–41)
Albumin: 3.6 g/dL (ref 3.5–5.0)
Alkaline Phosphatase: 86 U/L (ref 38–126)
BILIRUBIN TOTAL: 0.6 mg/dL (ref 0.3–1.2)
BUN: 11 mg/dL (ref 8–23)
CALCIUM: 9 mg/dL (ref 8.9–10.3)
CO2: 22 mmol/L (ref 22–32)
Chloride: 110 mmol/L (ref 98–111)
Creatinine, Ser: 0.72 mg/dL (ref 0.44–1.00)
GFR calc Af Amer: >60 mL/min (ref >60)
Glucose, Bld: 88 mg/dL (ref 70–99)
POTASSIUM: 4.6 mmol/L (ref 3.5–5.1)
Sodium: 140 mmol/L (ref 135–145)
TOTAL PROTEIN: 6.8 g/dL (ref 6.5–8.1)

## 2018-06-04 LAB — CBC WITH DIFFERENTIAL/PLATELET
BASOS PCT: 1 %
Basophils Absolute: 0.1 10*3/uL (ref 0.0–0.1)
EOS PCT: 3 %
Eosinophils Absolute: 0.2 10*3/uL (ref 0.0–0.7)
HEMATOCRIT: 38.3 % (ref 36.0–46.0)
Hemoglobin: 11.6 g/dL — ABNORMAL LOW (ref 12.0–15.0)
LYMPHS ABS: 2.7 10*3/uL (ref 0.7–4.0)
Lymphocytes Relative: 43 %
MCH: 24.7 pg — AB (ref 26.0–34.0)
MCHC: 30.3 g/dL (ref 30.0–36.0)
MCV: 81.7 fL (ref 78.0–100.0)
MONOS PCT: 7 %
Monocytes Absolute: 0.4 10*3/uL (ref 0.1–1.0)
Neutro Abs: 2.9 10*3/uL (ref 1.7–7.7)
Neutrophils Relative %: 46 %
Platelets: 203 10*3/uL (ref 150–400)
RBC: 4.69 MIL/uL (ref 3.87–5.11)
RDW: 14.6 % (ref 11.5–15.5)
WBC: 6.3 10*3/uL (ref 4.0–10.5)

## 2018-06-04 LAB — PROTIME-INR
INR: 2.29
PROTHROMBIN TIME: 25 s — AB (ref 11.4–15.2)

## 2018-06-04 LAB — APTT: APTT: 45 s — AB (ref 24–36)

## 2018-06-04 NOTE — Progress Notes (Signed)
Left message for Brittany [name] at Dr. Velvet Bathe office  To notify of lab results..PT and PTT.

## 2018-06-04 NOTE — Progress Notes (Signed)
Ensure drink and instructions given to pt.

## 2018-06-05 NOTE — H&P (Signed)
Brittany [name] Location: [facility] Patient #: [id number] DOB: April 14, 1946 Married / Language: English / Race: Black or African American Female        History of Present Illness       This is a 72 year old female who presents for further evaluation and discussion of definitive surgery for her right breast cancer. Her husband is with her throughout the encounter. [name] is her PCP. Dr. Lindi Adie is her oncologist. Current treatment is neoadjuvant letrozole. She is strongly motivated for breast conservation surgery.       Initially she was diagnosed with right breast cancer on June 22, 2017. Mammograms and MRI showed a 5.6 cm area of non-mass-like enhancement in the right breast upper outer quadrant. The 5.6 cm dimension was anterior to posterior and seemed to be a solitary finding. Axilla was negative. MRI guided biopsy was performed on the most superior part of this showed invasive ductal carcinoma and DCIS, ER/PR positive. She wanted neoadjuvant therapy to see if the tumor could be down staged. To divide the function of her tumor we went ahead with a third MRI guided biopsy of the right breast and biopsy of the most inferior aspect of the enhancement which showed flat epithelial atypia with excision strongly recommended. She has been told that this area entire area will need to be removed regardless. She developed to hematomas after the third biopsy and these have resolved clinically. She does not feel any further mass in the breast and has no pain.      Dr. Lindi Adie placed her on neoadjuvant anastrozole. Oncotype score was 18 with recurrence risks of 11%. He told her she did not need chemotherapy. She's been on anastrozole the entire time.  Follow-up MRI was performed on December 16, 2017. The non-mass enhancement in the right breast was down from 5.6-3 cm. The hematomas were smaller. She was ready for surgery at that time from our standpoint she wanted to  wait. Right breast mammogram and [place] was performed on April 19, 2018 and this showed that the hematomas were down to less than 1 cm.      Comorbidities include history of DVT and pulmonary embolism on Coumadin. She says she has had 2 episodes. She is followed by Dr. Kenton Kingfisher. She was told this was secondary to hormone replacement therapy. She had a Lovenox bridge which she had some other surgery previously. Family history is negative except for father died of prostate cancer. Mother died of dementia Social history reveals she is married and retired. 2 daughters who are very attentive. She denies alcohol or tobacco.  Plan is as follows:     She'll be scheduled for injection of blue dye right breast, right breast lumpectomy with dual bracketed radioactive seed localization, right axillary deep sentinel lymph node biopsy She agrees with this plan.      In terms of her Coumadin, I told her she would need to discontinue this a full 5 days prior to surgery with surgery on the 6th day. -Dr. Kenton Kingfisher will be contacted for consideration and management of Lovenox bridge pre and post operative if this seems to be indicated -She wants surgery in August -She is advised to continue the letrozole -She knows that she will need radiation therapy postop and is accepting of that   Allergies  No Known Allergies  Allergies Reconciled   Medication History  Vitamin D (1000UNIT Tablet, Oral) Active. Calcium (600MG  Tablet, Oral) Active. Magnesium (100MG  Capsule, Oral) Active. Anastrozole (1MG  Tablet, Oral) Active.  Warfarin Sodium (7.5MG  Tablet, Oral) Active. Warfarin Sodium (5MG  Tablet, Oral) Active. Medications Reconciled  Vitals Weight: 218.2 lb Height: 65in Body Surface Area: 2.05 m Body Mass Index: 36.31 kg/m  Temp.: 97.60F  Pulse: 64 (Regular)  BP: 128/82 (Sitting, Left Arm, Standard)    Physical Exam  General Mental Status-Alert. General Appearance-Not in acute  distress. Build & Nutrition-Well nourished. Posture-Normal posture. Gait-Normal.  Head and Neck Head-normocephalic, atraumatic with no lesions or palpable masses. Trachea-midline. Thyroid Gland Characteristics - normal size and consistency and no palpable nodules.  Chest and Lung Exam Chest and lung exam reveals -on auscultation, normal breath sounds, no adventitious sounds and normal vocal resonance.  Breast Note: Both breasts are examined. There are now no palpable masses. Biopsy sites healed. No adenopathy. No other skin changes.   Cardiovascular Cardiovascular examination reveals -normal heart sounds, regular rate and rhythm with no murmurs and femoral artery auscultation bilaterally reveals normal pulses, no bruits, no thrills.  Abdomen Inspection Inspection of the abdomen reveals - No Hernias. Palpation/Percussion Palpation and Percussion of the abdomen reveal - Soft, Non Tender, No Rigidity (guarding), No hepatosplenomegaly and No Palpable abdominal masses.  Neurologic Neurologic evaluation reveals -alert and oriented x 3 with no impairment of recent or remote memory, normal attention span and ability to concentrate, normal sensation and normal coordination.  Musculoskeletal Normal Exam - Bilateral-Upper Extremity Strength Normal and Lower Extremity Strength Normal.    Assessment & Plan  PRIMARY CANCER OF UPPER OUTER QUADRANT OF RIGHT FEMALE BREAST (C50.411)   Breast exam today is very favorable. I do not feel any abnormalities in the breast or in the axillary lymph nodes The hematomas have almost completely resolved Right breast mammogram and ultrasound performed on April 19, 2018 looks very favorable with the hematomas being less than 1 cm. Breast density has gone down I have advised you to go ahead with definitive surgery for your right breast cancer and you agree You requested breast conservation surgery and I think you are a good candidate  for that     Continue to take the letrozole pill you will be referred to radiation oncology postoperatively discussed that at length today.     you will be scheduled for injection blue dye right breast, right breast lumpectomy with double bracketed radioactive seed localization, right axillary deep sentinel lymph node biopsy I have discussed the indications, techniques, and risks of this surgery in detail with you and your husband       As we discussed, you will need to stop your Coumadin a full 5 days prior to surgery Dr. Kenton Kingfisher will decide whether you receive Lovenox shot as a bridge before and after surgery If he chooses to give Lovenox do not take any Lovenox for a full 24 hours prior to the surgery Our office will contact him to let him know the date of surgery once it is set up You have requested that we proceed with surgery in August.  ANTICOAGULATED ON COUMADIN (Z79.01) HISTORY OF PULMONARY EMBOLISM (Z86.711)  BREAST HEMATOMA, RESOLVING (N64.89) HISTORY OF ECTOPIC PREGNANCY (Z87.59) HISTORY OF TUBAL LIGATION (Z98.51)    Edsel Petrin. Dalbert Batman, M.D., [facility] General and Minimally invasive Surgery Breast and Colorectal Surgery Office:   [phone] Pager:   [phone]

## 2018-06-07 ENCOUNTER — Ambulatory Visit
Admission: RE | Admit: 2018-06-07 | Discharge: 2018-06-07 | Disposition: A | Discharge status: Home / Self Care | Payer: Medicare Other | Source: Ambulatory Visit | Attending: General Surgery | Admitting: General Surgery

## 2018-06-07 DIAGNOSIS — C50411 Malignant neoplasm of upper-outer quadrant of right female breast: Secondary | ICD-10-CM

## 2018-06-07 DIAGNOSIS — C50911 Malignant neoplasm of unspecified site of right female breast: Secondary | ICD-10-CM | POA: Diagnosis not present

## 2018-06-07 DIAGNOSIS — N6081 Other benign mammary dysplasias of right breast: Secondary | ICD-10-CM | POA: Diagnosis not present

## 2018-06-07 NOTE — Progress Notes (Addendum)
Notified Hassan Rowan ( via message) and spoke with Abigail Butts- of PT/ PTT results, will evaluate with Dr. Dalbert Batman day of surgery to see if he wants additional testing.  Notified Dr. Dalbert Batman, gave orders to obtain PT/INR day of surgery.

## 2018-06-07 NOTE — Anesthesia Preprocedure Evaluation (Addendum)
Anesthesia Evaluation  Patient identified by MRN, date of birth, ID band Patient awake    Reviewed: Allergy & Precautions, H&P , NPO status , Patient's Chart, lab work & pertinent test results, reviewed documented beta blocker date and time   Airway Mallampati: II  TM Distance: >3 FB Neck ROM: full    Dental no notable dental hx.    Pulmonary neg pulmonary ROS,    Pulmonary exam normal breath sounds clear to auscultation       Cardiovascular Exercise Tolerance: Good negative cardio ROS   Rhythm:regular Rate:Normal     Neuro/Psych negative neurological ROS  negative psych ROS   GI/Hepatic negative GI ROS, Neg liver ROS,   Endo/Other  negative endocrine ROS  Renal/GU negative Renal ROS  negative genitourinary   Musculoskeletal   Abdominal   Peds  Hematology negative hematology ROS (+)   Anesthesia Other Findings   Reproductive/Obstetrics negative OB ROS                             Anesthesia Physical Anesthesia Plan  ASA: II  Anesthesia Plan: General   Post-op Pain Management: GA combined w/ Regional for post-op pain   Induction:   PONV Risk Score and Plan: 2 and Ondansetron, Treatment may vary due to age or medical condition and Dexamethasone  Airway Management Planned: Oral ETT and LMA  Additional Equipment:   Intra-op Plan:   Post-operative Plan: Extubation in OR  Informed Consent: I have reviewed the patients History and Physical, chart, labs and discussed the procedure including the risks, benefits and alternatives for the proposed anesthesia with the patient or authorized representative who has indicated his/her understanding and acceptance.   Dental Advisory Given  Plan Discussed with: CRNA, Anesthesiologist and Surgeon  Anesthesia Plan Comments:         Anesthesia Quick Evaluation

## 2018-06-08 ENCOUNTER — Encounter (HOSPITAL_BASED_OUTPATIENT_CLINIC_OR_DEPARTMENT_OTHER)
Admission: RE | Disposition: A | Discharge status: Home / Self Care | Payer: Self-pay | Source: Ambulatory Visit | Attending: General Surgery

## 2018-06-08 ENCOUNTER — Encounter (HOSPITAL_BASED_OUTPATIENT_CLINIC_OR_DEPARTMENT_OTHER): Payer: Self-pay | Admitting: Certified Registered"

## 2018-06-08 ENCOUNTER — Other Ambulatory Visit: Payer: Self-pay

## 2018-06-08 ENCOUNTER — Ambulatory Visit (HOSPITAL_BASED_OUTPATIENT_CLINIC_OR_DEPARTMENT_OTHER): Payer: Medicare Other | Admitting: Anesthesiology

## 2018-06-08 ENCOUNTER — Ambulatory Visit
Admission: RE | Admit: 2018-06-08 | Discharge: 2018-06-08 | Disposition: A | Discharge status: Home / Self Care | Payer: Medicare Other | Source: Ambulatory Visit | Attending: General Surgery | Admitting: General Surgery

## 2018-06-08 ENCOUNTER — Ambulatory Visit (HOSPITAL_COMMUNITY)
Admission: RE | Admit: 2018-06-08 | Discharge: 2018-06-08 | Disposition: A | Discharge status: Home / Self Care | Payer: Medicare Other | Source: Ambulatory Visit | Attending: General Surgery | Admitting: General Surgery

## 2018-06-08 ENCOUNTER — Ambulatory Visit (HOSPITAL_BASED_OUTPATIENT_CLINIC_OR_DEPARTMENT_OTHER)
Admission: RE | Admit: 2018-06-08 | Discharge: 2018-06-08 | Disposition: A | Discharge status: Home / Self Care | Payer: Medicare Other | Source: Ambulatory Visit | Attending: General Surgery | Admitting: General Surgery

## 2018-06-08 DIAGNOSIS — Z86718 Personal history of other venous thrombosis and embolism: Secondary | ICD-10-CM | POA: Diagnosis not present

## 2018-06-08 DIAGNOSIS — Z170 Estrogen receptor positive status [ER+]: Secondary | ICD-10-CM | POA: Insufficient documentation

## 2018-06-08 DIAGNOSIS — N6011 Diffuse cystic mastopathy of right breast: Secondary | ICD-10-CM | POA: Insufficient documentation

## 2018-06-08 DIAGNOSIS — Z9851 Tubal ligation status: Secondary | ICD-10-CM | POA: Diagnosis not present

## 2018-06-08 DIAGNOSIS — Z86711 Personal history of pulmonary embolism: Secondary | ICD-10-CM | POA: Diagnosis not present

## 2018-06-08 DIAGNOSIS — C50411 Malignant neoplasm of upper-outer quadrant of right female breast: Secondary | ICD-10-CM | POA: Insufficient documentation

## 2018-06-08 DIAGNOSIS — Z7901 Long term (current) use of anticoagulants: Secondary | ICD-10-CM | POA: Diagnosis not present

## 2018-06-08 DIAGNOSIS — N6081 Other benign mammary dysplasias of right breast: Secondary | ICD-10-CM | POA: Diagnosis not present

## 2018-06-08 DIAGNOSIS — Z79811 Long term (current) use of aromatase inhibitors: Secondary | ICD-10-CM | POA: Diagnosis not present

## 2018-06-08 DIAGNOSIS — Z79899 Other long term (current) drug therapy: Secondary | ICD-10-CM | POA: Insufficient documentation

## 2018-06-08 DIAGNOSIS — G8918 Other acute postprocedural pain: Secondary | ICD-10-CM | POA: Diagnosis not present

## 2018-06-08 DIAGNOSIS — C50911 Malignant neoplasm of unspecified site of right female breast: Secondary | ICD-10-CM | POA: Diagnosis not present

## 2018-06-08 HISTORY — PX: BREAST LUMPECTOMY WITH RADIOACTIVE SEED AND SENTINEL LYMPH NODE BIOPSY: SHX6550

## 2018-06-08 LAB — PROTIME-INR
INR: 1.11
Prothrombin Time: 14.2 seconds (ref 11.4–15.2)

## 2018-06-08 SURGERY — BREAST LUMPECTOMY WITH RADIOACTIVE SEED AND SENTINEL LYMPH NODE BIOPSY
Anesthesia: General | Laterality: Right

## 2018-06-08 MED ORDER — BUPIVACAINE-EPINEPHRINE 0.5% -1:200000 IJ SOLN
INTRAMUSCULAR | Status: DC | PRN
  Administered 2018-06-08: 20 mL

## 2018-06-08 MED ORDER — LACTATED RINGERS IV SOLN: INTRAVENOUS | Status: DC

## 2018-06-08 MED ORDER — OXYCODONE HCL 5 MG PO TABS: 5.0000 mg | ORAL_TABLET | ORAL | Status: DC | PRN

## 2018-06-08 MED ORDER — SODIUM CHLORIDE 0.9 % IJ SOLN
INTRAMUSCULAR | Status: AC
Start: 2018-06-08 — End: ?
  Filled 2018-06-08: qty 10

## 2018-06-08 MED ORDER — SODIUM CHLORIDE 0.9 % IV SOLN: 250.0000 mL | INTRAVENOUS | Status: DC | PRN

## 2018-06-08 MED ORDER — ACETAMINOPHEN 500 MG PO TABS
1000.0000 mg | ORAL_TABLET | ORAL | Status: AC
  Administered 2018-06-08: 1000 mg via ORAL

## 2018-06-08 MED ORDER — FENTANYL CITRATE (PF) 100 MCG/2ML IJ SOLN: 25.0000 ug | INTRAMUSCULAR | Status: DC | PRN

## 2018-06-08 MED ORDER — ONDANSETRON HCL 4 MG/2ML IJ SOLN: 4.0000 mg | Freq: Once | INTRAMUSCULAR | Status: DC | PRN

## 2018-06-08 MED ORDER — PROPOFOL 10 MG/ML IV BOLUS
INTRAVENOUS | Status: DC | PRN
  Administered 2018-06-08: 150 mg via INTRAVENOUS

## 2018-06-08 MED ORDER — BUPIVACAINE-EPINEPHRINE (PF) 0.5% -1:200000 IJ SOLN
INTRAMUSCULAR | Status: AC
  Filled 2018-06-08: qty 30

## 2018-06-08 MED ORDER — TECHNETIUM TC 99M SULFUR COLLOID FILTERED
1.0000 | Freq: Once | INTRAVENOUS | Status: AC | PRN
  Administered 2018-06-08: 1 via INTRADERMAL

## 2018-06-08 MED ORDER — HYDROCODONE-ACETAMINOPHEN 5-325 MG PO TABS: 1.0000 | ORAL_TABLET | Freq: Four times a day (QID) | ORAL | 0 refills | Status: DC | PRN

## 2018-06-08 MED ORDER — MIDAZOLAM HCL 2 MG/2ML IJ SOLN
1.0000 mg | INTRAMUSCULAR | Status: DC | PRN
  Administered 2018-06-08: 2 mg via INTRAVENOUS

## 2018-06-08 MED ORDER — SODIUM CHLORIDE 0.9% FLUSH: 3.0000 mL | Freq: Two times a day (BID) | INTRAVENOUS | Status: DC

## 2018-06-08 MED ORDER — GABAPENTIN 300 MG PO CAPS
300.0000 mg | ORAL_CAPSULE | ORAL | Status: AC
  Administered 2018-06-08: 300 mg via ORAL

## 2018-06-08 MED ORDER — ACETAMINOPHEN 325 MG PO TABS: 325.0000 mg | ORAL_TABLET | ORAL | Status: DC | PRN

## 2018-06-08 MED ORDER — BUPIVACAINE-EPINEPHRINE (PF) 0.25% -1:200000 IJ SOLN
INTRAMUSCULAR | Status: AC
  Filled 2018-06-08: qty 30

## 2018-06-08 MED ORDER — MIDAZOLAM HCL 2 MG/2ML IJ SOLN
INTRAMUSCULAR | Status: AC
  Filled 2018-06-08: qty 2

## 2018-06-08 MED ORDER — CELECOXIB 200 MG PO CAPS
200.0000 mg | ORAL_CAPSULE | ORAL | Status: AC
Start: 2018-06-08 — End: 2018-06-08
  Administered 2018-06-08: 200 mg via ORAL

## 2018-06-08 MED ORDER — ACETAMINOPHEN 325 MG PO TABS: 650.0000 mg | ORAL_TABLET | ORAL | Status: DC | PRN

## 2018-06-08 MED ORDER — ACETAMINOPHEN 650 MG RE SUPP: 650.0000 mg | RECTAL | Status: DC | PRN

## 2018-06-08 MED ORDER — CEFAZOLIN SODIUM-DEXTROSE 2-4 GM/100ML-% IV SOLN
2.0000 g | INTRAVENOUS | Status: AC
  Administered 2018-06-08: 2 g via INTRAVENOUS

## 2018-06-08 MED ORDER — EPHEDRINE SULFATE 50 MG/ML IJ SOLN
INTRAMUSCULAR | Status: DC | PRN
  Administered 2018-06-08 (×3): 10 mg via INTRAVENOUS

## 2018-06-08 MED ORDER — DEXAMETHASONE SODIUM PHOSPHATE 4 MG/ML IJ SOLN
INTRAMUSCULAR | Status: DC | PRN
  Administered 2018-06-08: 10 mg via INTRAVENOUS

## 2018-06-08 MED ORDER — CEFAZOLIN SODIUM-DEXTROSE 2-4 GM/100ML-% IV SOLN
INTRAVENOUS | Status: AC
  Filled 2018-06-08: qty 100

## 2018-06-08 MED ORDER — MEPERIDINE HCL 25 MG/ML IJ SOLN: 6.2500 mg | INTRAMUSCULAR | Status: DC | PRN

## 2018-06-08 MED ORDER — FENTANYL CITRATE (PF) 100 MCG/2ML IJ SOLN
50.0000 ug | INTRAMUSCULAR | Status: DC | PRN
  Administered 2018-06-08: 100 ug via INTRAVENOUS
  Administered 2018-06-08: 50 ug via INTRAVENOUS

## 2018-06-08 MED ORDER — LACTATED RINGERS IV SOLN
INTRAVENOUS | Status: DC
  Administered 2018-06-08 (×2): via INTRAVENOUS

## 2018-06-08 MED ORDER — ROPIVACAINE HCL 7.5 MG/ML IJ SOLN
INTRAMUSCULAR | Status: DC | PRN
  Administered 2018-06-08: 25 mL via PERINEURAL

## 2018-06-08 MED ORDER — OXYCODONE HCL 5 MG PO TABS: 5.0000 mg | ORAL_TABLET | Freq: Once | ORAL | Status: DC | PRN

## 2018-06-08 MED ORDER — CELECOXIB 200 MG PO CAPS
ORAL_CAPSULE | ORAL | Status: AC
  Filled 2018-06-08: qty 1

## 2018-06-08 MED ORDER — SODIUM CHLORIDE 0.9 % IJ SOLN
INTRAVENOUS | Status: DC | PRN
  Administered 2018-06-08: 5 mL via INTRADERMAL

## 2018-06-08 MED ORDER — GABAPENTIN 300 MG PO CAPS
ORAL_CAPSULE | ORAL | Status: AC
  Filled 2018-06-08: qty 1

## 2018-06-08 MED ORDER — SCOPOLAMINE 1 MG/3DAYS TD PT72: 1.0000 | MEDICATED_PATCH | Freq: Once | TRANSDERMAL | Status: DC | PRN

## 2018-06-08 MED ORDER — FENTANYL CITRATE (PF) 100 MCG/2ML IJ SOLN
INTRAMUSCULAR | Status: AC
  Filled 2018-06-08: qty 2

## 2018-06-08 MED ORDER — METHYLENE BLUE 0.5 % INJ SOLN
INTRAVENOUS | Status: AC
  Filled 2018-06-08: qty 10

## 2018-06-08 MED ORDER — OXYCODONE HCL 5 MG/5ML PO SOLN: 5.0000 mg | Freq: Once | ORAL | Status: DC | PRN

## 2018-06-08 MED ORDER — LIDOCAINE HCL (CARDIAC) PF 100 MG/5ML IV SOSY
PREFILLED_SYRINGE | INTRAVENOUS | Status: DC | PRN
  Administered 2018-06-08: 30 mg via INTRAVENOUS

## 2018-06-08 MED ORDER — CHLORHEXIDINE GLUCONATE CLOTH 2 % EX PADS: 6.0000 | MEDICATED_PAD | Freq: Once | CUTANEOUS | Status: DC

## 2018-06-08 MED ORDER — ONDANSETRON HCL 4 MG/2ML IJ SOLN
INTRAMUSCULAR | Status: DC | PRN
  Administered 2018-06-08: 4 mg via INTRAVENOUS

## 2018-06-08 MED ORDER — SODIUM CHLORIDE 0.9% FLUSH: 3.0000 mL | INTRAVENOUS | Status: DC | PRN

## 2018-06-08 MED ORDER — ACETAMINOPHEN 160 MG/5ML PO SOLN: 325.0000 mg | ORAL | Status: DC | PRN

## 2018-06-08 MED ORDER — ACETAMINOPHEN 500 MG PO TABS
ORAL_TABLET | ORAL | Status: AC
  Filled 2018-06-08: qty 2

## 2018-06-08 SURGICAL SUPPLY — 63 items
ADH SKN CLS APL DERMABOND .7 (GAUZE/BANDAGES/DRESSINGS) ×1
APPLIER CLIP 11 MED OPEN (CLIP) ×2
APR CLP MED 11 20 MLT OPN (CLIP) ×1
BANDAGE ACE 6X5 VEL STRL LF (GAUZE/BANDAGES/DRESSINGS) IMPLANT
BINDER BREAST LRG (GAUZE/BANDAGES/DRESSINGS) IMPLANT
BINDER BREAST MEDIUM (GAUZE/BANDAGES/DRESSINGS) IMPLANT
BINDER BREAST XLRG (GAUZE/BANDAGES/DRESSINGS) ×1 IMPLANT
BINDER BREAST XXLRG (GAUZE/BANDAGES/DRESSINGS) IMPLANT
BLADE HEX COATED 2.75 (ELECTRODE) ×2 IMPLANT
BLADE SURG 15 STRL LF DISP TIS (BLADE) ×2 IMPLANT
BLADE SURG 15 STRL SS (BLADE) ×4
CANISTER SUCT 1200ML W/VALVE (MISCELLANEOUS) ×2 IMPLANT
CHLORAPREP W/TINT 26ML (MISCELLANEOUS) ×2 IMPLANT
CLIP APPLIE 11 MED OPEN (CLIP) ×1 IMPLANT
COVER BACK TABLE 60X90IN (DRAPES) ×2 IMPLANT
COVER MAYO STAND STRL (DRAPES) ×2 IMPLANT
COVER PROBE W GEL 5X96 (DRAPES) ×2 IMPLANT
DECANTER SPIKE VIAL GLASS SM (MISCELLANEOUS) IMPLANT
DERMABOND ADVANCED (GAUZE/BANDAGES/DRESSINGS) ×1
DERMABOND ADVANCED .7 DNX12 (GAUZE/BANDAGES/DRESSINGS) IMPLANT
DEVICE DUBIN W/COMP PLATE 8390 (MISCELLANEOUS) ×2 IMPLANT
DRAPE LAPAROSCOPIC ABDOMINAL (DRAPES) ×2 IMPLANT
DRAPE UTILITY XL STRL (DRAPES) ×2 IMPLANT
DRSG PAD ABDOMINAL 8X10 ST (GAUZE/BANDAGES/DRESSINGS) ×1 IMPLANT
ELECT REM PT RETURN 9FT ADLT (ELECTROSURGICAL) ×2
ELECTRODE REM PT RTRN 9FT ADLT (ELECTROSURGICAL) ×1 IMPLANT
GAUZE SPONGE 4X4 12PLY STRL (GAUZE/BANDAGES/DRESSINGS) ×2 IMPLANT
GAUZE SPONGE 4X4 12PLY STRL LF (GAUZE/BANDAGES/DRESSINGS) ×1 IMPLANT
GLOVE BIO SURGEON STRL SZ7 (GLOVE) ×1 IMPLANT
GLOVE BIOGEL PI IND STRL 7.0 (GLOVE) IMPLANT
GLOVE BIOGEL PI INDICATOR 7.0 (GLOVE) ×1
GLOVE EUDERMIC 7 POWDERFREE (GLOVE) ×2 IMPLANT
GOWN STRL REUS W/ TWL LRG LVL3 (GOWN DISPOSABLE) ×1 IMPLANT
GOWN STRL REUS W/ TWL XL LVL3 (GOWN DISPOSABLE) ×1 IMPLANT
GOWN STRL REUS W/TWL LRG LVL3 (GOWN DISPOSABLE) ×2
GOWN STRL REUS W/TWL XL LVL3 (GOWN DISPOSABLE) ×2
ILLUMINATOR WAVEGUIDE N/F (MISCELLANEOUS) IMPLANT
KIT MARKER MARGIN INK (KITS) ×2 IMPLANT
LIGHT WAVEGUIDE WIDE FLAT (MISCELLANEOUS) IMPLANT
NDL HYPO 25X1 1.5 SAFETY (NEEDLE) ×2 IMPLANT
NDL SAFETY ECLIPSE 18X1.5 (NEEDLE) ×1 IMPLANT
NEEDLE HYPO 18GX1.5 SHARP (NEEDLE) ×2
NEEDLE HYPO 25X1 1.5 SAFETY (NEEDLE) ×4 IMPLANT
NS IRRIG 1000ML POUR BTL (IV SOLUTION) ×2 IMPLANT
PACK BASIN DAY SURGERY FS (CUSTOM PROCEDURE TRAY) ×2 IMPLANT
PAD ALCOHOL SWAB (MISCELLANEOUS) ×2 IMPLANT
PENCIL BUTTON HOLSTER BLD 10FT (ELECTRODE) ×2 IMPLANT
SHEET MEDIUM DRAPE 40X70 STRL (DRAPES) ×2 IMPLANT
SLEEVE SCD COMPRESS KNEE MED (MISCELLANEOUS) ×2 IMPLANT
SPONGE LAP 18X18 RF (DISPOSABLE) IMPLANT
SPONGE LAP 4X18 RFD (DISPOSABLE) ×3 IMPLANT
SUT MNCRL AB 4-0 PS2 18 (SUTURE) ×4 IMPLANT
SUT SILK 2 0 SH (SUTURE) ×2 IMPLANT
SUT VIC AB 2-0 CT1 27 (SUTURE)
SUT VIC AB 2-0 CT1 TAPERPNT 27 (SUTURE) IMPLANT
SUT VIC AB 3-0 SH 27 (SUTURE)
SUT VIC AB 3-0 SH 27X BRD (SUTURE) IMPLANT
SUT VICRYL 3-0 CR8 SH (SUTURE) ×2 IMPLANT
SYR 10ML LL (SYRINGE) ×4 IMPLANT
TOWEL GREEN STERILE FF (TOWEL DISPOSABLE) ×4 IMPLANT
TOWEL OR NON WOVEN STRL DISP B (DISPOSABLE) ×2 IMPLANT
TUBE CONNECTING 20X1/4 (TUBING) ×2 IMPLANT
YANKAUER SUCT BULB TIP NO VENT (SUCTIONS) ×2 IMPLANT

## 2018-06-08 NOTE — Discharge Instructions (Signed)
[facility] °Office Phone Number [phone] ° °BREAST BIOPSY/ PARTIAL MASTECTOMY: POST OP INSTRUCTIONS ° °Always review your discharge instruction sheet given to you by the facility where your surgery was performed. ° °IF YOU HAVE DISABILITY OR FAMILY LEAVE FORMS, YOU MUST BRING THEM TO THE OFFICE FOR PROCESSING.  DO NOT GIVE THEM TO YOUR DOCTOR. ° °1. A prescription for pain medication may be given to you upon discharge.  Take your pain medication as prescribed, if needed.  If narcotic pain medicine is not needed, then you may take acetaminophen (Tylenol) or ibuprofen (Advil) as needed. °2. Take your usually prescribed medications unless otherwise directed °3. If you need a refill on your pain medication, please contact your pharmacy.  They will contact our office to request authorization.  Prescriptions will not be filled after 5pm or on week-ends. °4. You should eat very light the first 24 hours after surgery, such as soup, crackers, pudding, etc.  Resume your normal diet the day after surgery. °5. Most patients will experience some swelling and bruising in the breast.  Ice packs and a good support bra will help.  Swelling and bruising can take several days to resolve.  °6. It is common to experience some constipation if taking pain medication after surgery.  Increasing fluid intake and taking a stool softener will usually help or prevent this problem from occurring.  A mild laxative (Milk of Magnesia or Miralax) should be taken according to package directions if there are no bowel movements after 48 hours. °7. Unless discharge instructions indicate otherwise, you may remove your bandages 24-48 hours after surgery, and you may shower at that time.  You may have steri-strips (small skin tapes) in place directly over the incision.  These strips should be left on the skin for 7-10 days.  If your surgeon used skin glue on the incision, you may shower in 24 hours.  The glue will flake off over the  next 2-3 weeks.  Any sutures or staples will be removed at the office during your follow-up visit. °8. ACTIVITIES:  You may resume regular daily activities (gradually increasing) beginning the next day.  Wearing a good support bra or sports bra minimizes pain and swelling.  You may have sexual intercourse when it is comfortable. °a. You may drive when you no longer are taking prescription pain medication, you can comfortably wear a seatbelt, and you can safely maneuver your car and apply brakes. °b. RETURN TO WORK:  ______________________________________________________________________________________ °9. You should see your doctor in the office for a follow-up appointment approximately two weeks after your surgery.  Your doctor’s nurse will typically make your follow-up appointment when she calls you with your pathology report.  Expect your pathology report 2-3 business days after your surgery.  You may call to check if you do not hear from us after three days. °10. OTHER INSTRUCTIONS: _______________________________________________________________________________________________ _____________________________________________________________________________________________________________________________________ °_____________________________________________________________________________________________________________________________________ °_____________________________________________________________________________________________________________________________________ ° °WHEN TO CALL YOUR DOCTOR: °1. Fever over 101.0 °2. Nausea and/or vomiting. °3. Extreme swelling or bruising. °4. Continued bleeding from incision. °5. Increased pain, redness, or drainage from the incision. ° °The clinic staff is available to answer your questions during regular business hours.  Please don’t hesitate to call and ask to speak to one of the nurses for clinical concerns.  If you have a medical emergency, go to the nearest  emergency room or call 911.  A surgeon from [facility] is always on call at the hospital. ° °For further questions, please visit [email or web address]  ° ° ° ° °  Post Anesthesia Home Care Instructions ° °Activity: °Get plenty of rest for the remainder of the day. A responsible individual must stay with you for 24 hours following the procedure.  °For the next 24 hours, DO NOT: °-Drive a car °-Operate machinery °-Drink alcoholic beverages °-Take any medication unless instructed by your physician °-Make any legal decisions or sign important papers. ° °Meals: °Start with liquid foods such as gelatin or soup. Progress to regular foods as tolerated. Avoid greasy, spicy, heavy foods. If nausea and/or vomiting occur, drink only clear liquids until the nausea and/or vomiting subsides. Call your physician if vomiting continues. ° °Special Instructions/Symptoms: °Your throat may feel dry or sore from the anesthesia or the breathing tube placed in your throat during surgery. If this causes discomfort, gargle with warm salt water. The discomfort should disappear within 24 hours. ° °If you had a scopolamine patch placed behind your ear for the management of post- operative nausea and/or vomiting: ° °1. The medication in the patch is effective for 72 hours, after which it should be removed.  Wrap patch in a tissue and discard in the trash. Wash hands thoroughly with soap and water. °2. You may remove the patch earlier than 72 hours if you experience unpleasant side effects which may include dry mouth, dizziness or visual disturbances. °3. Avoid touching the patch. Wash your hands with soap and water after contact with the patch. °  ° °

## 2018-06-08 NOTE — Transfer of Care (Signed)
Immediate Anesthesia Transfer of Care Note  Patient: Brittany [name]  Procedure(s) Performed: RIGHT BREAST LUMPECTOMY WITH DOUBLE BRACKETED RADIOACTIVE SEEDS AND SENTINEL LYMPH NODE BIOPSY ERAS PATHWAY (Right )  Patient Location: PACU  Anesthesia Type:GA combined with regional for post-op pain  Level of Consciousness: sedated and patient cooperative  Airway & Oxygen Therapy: Patient Spontanous Breathing and Patient connected to face mask oxygen  Post-op Assessment: Report given to RN and Post -op Vital signs reviewed and stable  Post vital signs: Reviewed and stable  Last Vitals:  Vitals Value Taken Time  BP    Temp    Pulse 95 06/08/2018  9:46 AM  Resp 18 06/08/2018  9:46 AM  SpO2 100 % 06/08/2018  9:46 AM  Vitals shown include unvalidated device data.  Last Pain:  Vitals:   06/08/18 0647  TempSrc: Oral  PainSc: 0-No pain         Complications: No apparent anesthesia complications

## 2018-06-08 NOTE — Interval H&P Note (Signed)
History and Physical Interval Note:  06/08/2018 8:02 AM  Brittany [name]  has presented today for surgery, with the diagnosis of Cancer right breast upper outer quadrant  The various methods of treatment have been discussed with the patient and family. After consideration of risks, benefits and other options for treatment, the patient has consented to  Procedure(s) with comments: RIGHT BREAST LUMPECTOMY WITH DOUBLE BRACKETED RADIOACTIVE SEEDS AND SENTINEL LYMPH NODE BIOPSY ERAS PATHWAY (Right) - TAP BLOCK as a surgical intervention .  The patient's history has been reviewed, patient examined, no change in status, stable for surgery.  I have reviewed the patient's chart and labs.  Questions were answered to the patient's satisfaction.     Adin Hector

## 2018-06-08 NOTE — Anesthesia Procedure Notes (Addendum)
Anesthesia Regional Block: Pectoralis block   Pre-Anesthetic Checklist: ,, timeout performed, Correct Patient, Correct Site, Correct Laterality, Correct Procedure, Correct Position, site marked, Risks and benefits discussed,  Surgical consent,  Pre-op evaluation,  At surgeon's request and post-op pain management  Laterality: Right  Prep: chloraprep       Needles:  Injection technique: Single-shot  Needle Type: Echogenic Stimulator Needle     Needle Length: 5cm  Needle Gauge: 22     Additional Needles:   Procedures:, nerve stimulator,,, ultrasound used (permanent image in chart),,,,  Narrative:  Start time: 06/08/2018 7:52 AM End time: 06/08/2018 8:00 AM Injection made incrementally with aspirations every 5 mL.  Performed by: Personally  Anesthesiologist: Janeece Riggers, MD  Additional Notes: Functioning IV was confirmed and monitors were applied.  A 63mm 22ga Arrow echogenic stimulator needle was used. Sterile prep and drape,hand hygiene and sterile gloves were used. Ultrasound guidance: relevant anatomy identified, needle position confirmed, local anesthetic spread visualized around nerve(s)., vascular puncture avoided.  Image printed for medical record. Negative aspiration and negative test dose prior to incremental administration of local anesthetic. The patient tolerated the procedure well.

## 2018-06-08 NOTE — Progress Notes (Signed)
Assisted nuc med tech with nuc med injections. Side rails up, monitors on throughout procedure. See vital signs in flow sheet. Tolerated Procedure well. 

## 2018-06-08 NOTE — Anesthesia Procedure Notes (Signed)
Procedure Name: LMA Insertion Date/Time: 06/08/2018 8:13 AM Performed by: Signe Colt, CRNA Pre-anesthesia Checklist: Patient identified, Emergency Drugs available, Suction available and Patient being monitored Patient Re-evaluated:Patient Re-evaluated prior to induction Oxygen Delivery Method: Circle system utilized Preoxygenation: Pre-oxygenation with 100% oxygen Induction Type: IV induction Ventilation: Mask ventilation without difficulty LMA: LMA inserted LMA Size: 4.0 Number of attempts: 1 Airway Equipment and Method: Bite block Placement Confirmation: positive ETCO2 Tube secured with: Tape Dental Injury: Teeth and Oropharynx as per pre-operative assessment

## 2018-06-08 NOTE — Progress Notes (Signed)
Assisted Dr. Oddono with right, ultrasound guided, pectoralis block. Side rails up, monitors on throughout procedure. See vital signs in flow sheet. Tolerated Procedure well. 

## 2018-06-08 NOTE — Anesthesia Postprocedure Evaluation (Signed)
Anesthesia Post Note  Patient: Brittany [name]  Procedure(s) Performed: RIGHT BREAST LUMPECTOMY WITH DOUBLE BRACKETED RADIOACTIVE SEEDS AND SENTINEL LYMPH NODE BIOPSY ERAS PATHWAY (Right )     Patient location during evaluation: PACU Anesthesia Type: General Level of consciousness: awake and alert Pain management: pain level controlled Vital Signs Assessment: post-procedure vital signs reviewed and stable Respiratory status: spontaneous breathing, nonlabored ventilation, respiratory function stable and patient connected to nasal cannula oxygen Cardiovascular status: blood pressure returned to baseline and stable Postop Assessment: no apparent nausea or vomiting Anesthetic complications: no    Last Vitals:  Vitals:   06/08/18 0801 06/08/18 0945  BP: (!) 101/54 (!) 149/69  Pulse:  93  Resp:  12  Temp:    SpO2:  100%    Last Pain:  Vitals:   06/08/18 0945  TempSrc:   PainSc: 0-No pain                 Betrice Wanat

## 2018-06-08 NOTE — Op Note (Addendum)
Patient Name:           Brittany [name]   Date of Surgery:        06/08/2018  Pre op Diagnosis:      Invasive cancer right breast, upper outer quadrant                                       Status post 12 months neoadjuvant aromatase inhibitors                                         Post op Diagnosis:    Same  Procedure:                 Inject blue dye right breast                                      Right breast lumpectomy with bracketed radioactive seed localization x2                                      Reexcision medial margin                                      Reexcision lateral margin                                      Right axillary deep sentinel lymph node biopsy  Surgeon:                     Edsel Petrin. Dalbert Batman, M.D., FACS  Assistant:                      OR staff  Operative Indications:   This is a 72 year old female who presents f for definitive surgery for her right breast cancer . [name] is her PCP. Dr. Lindi Adie is her oncologist. Current treatment is neoadjuvant letrozole.        Initially she was diagnosed with right breast cancer on June 22, 2017. Mammograms and MRI showed a 5.6 cm area of non-mass-like enhancement in the right breast upper outer quadrant. The 5.6 cm dimension was anterior to posterior and seemed to be a solitary finding. Axilla was negative. MRI guided biopsy was performed on the most superior part of this showed invasive ductal carcinoma and DCIS, ER/PR positive. She wanted neoadjuvant therapy to see if the tumor could be down staged. To define the extent of her tumor we went ahead with a third MRI guided biopsy of the right breast and biopsy of the most inferior aspect of the enhancement which showed flat epithelial atypia with excision strongly recommended. She has been told that this area entire area will need to be removed regardless. She developed to hematomas after the third biopsy and these have resolved clinically. She does not  feel any further mass in the breast and has no pain.      Dr. Lindi Adie placed her on neoadjuvant anastrozole.  Oncotype score was 18 with recurrence risks of 11%. He told her she did not need chemotherapy. She's been on anastrozole the entire time.  Follow-up MRI was performed on December 16, 2017. The non-mass enhancement in the right breast was down from 5.6-3 cm. The hematomas were smaller. She was ready for surgery at that time from our standpoint she wanted to wait. Right breast mammogram and [place] was performed on April 19, 2018 and this showed that the hematomas were down to less than 1 cm.      Comorbidities include history of DVT and pulmonary embolism on Coumadin. She says she has had 2 episodes. She is followed by Dr. Kenton Kingfisher. She was told this was secondary to hormone replacement therapy.     She'll be scheduled for injection of blue dye right breast, right breast lumpectomy with dual bracketed radioactive seed localization, right axillary deep sentinel lymph node biopsy She agrees with this plan.      In terms of her Coumadin, I told her she would need to discontinue this a full 5 days prior to surgery with surgery on the 6th day. -Dr. Kenton Kingfisher is managing her Coumadin therapy perioperatively and is managing her Lovenox bridge.  INR this morning was 1.1.   Operative Findings:       Lumpectomy was performed in the upper outer right breast and the lumpectomy specimen contained both radioactive seeds and both marker clips.  The posterior margin is the muscle.  I reexcised the medial margin.  I reexcised the lateral margin.  I found 3 sentinel lymph nodes.  Procedure in Detail:          The patient underwent pectoral block by the anesthesiologist.  Technetium radionuclide was injected into the right breast by the nuclear medicine technician.  The patient was taken to the operating room and underwent general LMA anesthesia.  Surgical timeout was performed.  Intravenous antibiotics were given.   Following alcohol prep I injected 5 cc of dilute methylene blue into the right breast and massaged the breast for a few minutes.  The entire right chest wall and axilla were then prepped and draped in a sterile fashion.  0.5% Marcaine with epinephrine was used as a local infiltration anesthetic to supplement the skin and subcutaneous tissue.      Using the neoprobe I identified and mapped out the radioactive signals from the seeds.  I chose to make a curvilinear incision in the lateral breast, a few centimeters lateral to the areolar margin.  Lumpectomy was performed through this incision using neoprobe and electrocautery.  The specimen mammogram looked good as described above.  I reexcised the medial margin and the lateral margin because I thought I might be close.  These were sent as separate specimens.  This wound was irrigated.  Hemostasis was excellent and achieved with electrocautery.  5 metal marker clips were placed in the walls of the lumpectomy cavity.  The lumpectomy cavity was closed in layers with interrupted sutures of 3-0 Vicryl and the skin closed with a running subcuticular 4-0 Monocryl and Dermabond.     A transverse incision was made in the right axilla, in the skin line, at the hairline.  Dissection was carried down through the subcutaneous tissue.  The clavipectoral fascia was identified and incised.  Using the neoprobe on the technetium setting I found 3 sentinel lymph nodes and these were sent separately.  There was no other blue dye or radioactivity.  This wound was irrigated.  Hemostasis was excellent.  The clavipectoral fascia was closed with 3-0 Vicryl sutures and the skin closed with a running subcuticular 4-0 Monocryl and Dermabond.  Dry bandages and a breast binder and ice pack were placed.  The patient tolerated the procedure well and was taken to PACU in stable condition.  EBL 20 cc.  Counts correct.  Complications none.    Addendum: I logged onto the [employer] and reviewed  her prescription medication history          Madelin Weseman M. Dalbert Batman, M.D., FACS General and Minimally Invasive Surgery Breast and Colorectal Surgery  06/08/2018 9:48 AM

## 2018-06-09 ENCOUNTER — Encounter (HOSPITAL_BASED_OUTPATIENT_CLINIC_OR_DEPARTMENT_OTHER): Payer: Self-pay | Admitting: General Surgery

## 2018-06-09 NOTE — Addendum Note (Signed)
Addendum  created 06/09/18 1003 by Janeece Riggers, MD   Intraprocedure Blocks edited, Sign clinical note

## 2018-06-12 ENCOUNTER — Encounter (HOSPITAL_COMMUNITY): Payer: Self-pay | Admitting: Emergency Medicine

## 2018-06-12 ENCOUNTER — Inpatient Hospital Stay (HOSPITAL_COMMUNITY)
Admission: EM | Admit: 2018-06-12 | Discharge: 2018-06-15 | DRG: 908 | Disposition: A | Discharge status: Home / Self Care | Payer: Medicare Other | Attending: General Surgery | Admitting: General Surgery

## 2018-06-12 ENCOUNTER — Other Ambulatory Visit: Payer: Self-pay

## 2018-06-12 DIAGNOSIS — Y763 Surgical instruments, materials and obstetric and gynecological devices (including sutures) associated with adverse incidents: Secondary | ICD-10-CM | POA: Diagnosis not present

## 2018-06-12 DIAGNOSIS — N644 Mastodynia: Secondary | ICD-10-CM | POA: Diagnosis present

## 2018-06-12 DIAGNOSIS — L7632 Postprocedural hematoma of skin and subcutaneous tissue following other procedure: Principal | ICD-10-CM | POA: Diagnosis present

## 2018-06-12 DIAGNOSIS — Y838 Other surgical procedures as the cause of abnormal reaction of the patient, or of later complication, without mention of misadventure at the time of the procedure: Secondary | ICD-10-CM | POA: Diagnosis present

## 2018-06-12 DIAGNOSIS — Z8042 Family history of malignant neoplasm of prostate: Secondary | ICD-10-CM

## 2018-06-12 DIAGNOSIS — Z86718 Personal history of other venous thrombosis and embolism: Secondary | ICD-10-CM

## 2018-06-12 DIAGNOSIS — C50919 Malignant neoplasm of unspecified site of unspecified female breast: Secondary | ICD-10-CM | POA: Diagnosis not present

## 2018-06-12 DIAGNOSIS — R000 Tachycardia, unspecified: Secondary | ICD-10-CM | POA: Diagnosis present

## 2018-06-12 DIAGNOSIS — I82622 Acute embolism and thrombosis of deep veins of left upper extremity: Secondary | ICD-10-CM

## 2018-06-12 DIAGNOSIS — Z7901 Long term (current) use of anticoagulants: Secondary | ICD-10-CM

## 2018-06-12 DIAGNOSIS — I82612 Acute embolism and thrombosis of superficial veins of left upper extremity: Secondary | ICD-10-CM | POA: Diagnosis present

## 2018-06-12 DIAGNOSIS — Z86711 Personal history of pulmonary embolism: Secondary | ICD-10-CM

## 2018-06-12 DIAGNOSIS — M549 Dorsalgia, unspecified: Secondary | ICD-10-CM | POA: Diagnosis present

## 2018-06-12 DIAGNOSIS — Z9851 Tubal ligation status: Secondary | ICD-10-CM

## 2018-06-12 DIAGNOSIS — G8929 Other chronic pain: Secondary | ICD-10-CM | POA: Diagnosis present

## 2018-06-12 DIAGNOSIS — D62 Acute posthemorrhagic anemia: Secondary | ICD-10-CM | POA: Diagnosis present

## 2018-06-12 DIAGNOSIS — N63 Unspecified lump in unspecified breast: Secondary | ICD-10-CM

## 2018-06-12 DIAGNOSIS — N6489 Other specified disorders of breast: Secondary | ICD-10-CM | POA: Diagnosis present

## 2018-06-12 HISTORY — DX: Acute embolism and thrombosis of deep veins of left upper extremity: I82.622

## 2018-06-12 LAB — CBC
HEMATOCRIT: 31.5 % — AB (ref 36.0–46.0)
Hemoglobin: 9.4 g/dL — ABNORMAL LOW (ref 12.0–15.0)
MCH: 25 pg — ABNORMAL LOW (ref 26.0–34.0)
MCHC: 29.8 g/dL — ABNORMAL LOW (ref 30.0–36.0)
MCV: 83.8 fL (ref 78.0–100.0)
PLATELETS: 323 10*3/uL (ref 150–400)
RBC: 3.76 MIL/uL — ABNORMAL LOW (ref 3.87–5.11)
RDW: 14.9 % (ref 11.5–15.5)
WBC: 13.1 10*3/uL — ABNORMAL HIGH (ref 4.0–10.5)

## 2018-06-12 LAB — COMPREHENSIVE METABOLIC PANEL
ALT: 146 U/L — ABNORMAL HIGH (ref 0–44)
AST: 76 U/L — AB (ref 15–41)
Albumin: 3.4 g/dL — ABNORMAL LOW (ref 3.5–5.0)
Alkaline Phosphatase: 122 U/L (ref 38–126)
Anion gap: 10 (ref 5–15)
BUN: 10 mg/dL (ref 8–23)
CHLORIDE: 105 mmol/L (ref 98–111)
CO2: 23 mmol/L (ref 22–32)
Calcium: 9 mg/dL (ref 8.9–10.3)
Creatinine, Ser: 0.35 mg/dL — ABNORMAL LOW (ref 0.44–1.00)
GFR calc Af Amer: >60 mL/min (ref >60)
GFR calc non Af Amer: >60 mL/min (ref >60)
GLUCOSE: 183 mg/dL — AB (ref 70–99)
POTASSIUM: 4.4 mmol/L (ref 3.5–5.1)
Sodium: 138 mmol/L (ref 135–145)
Total Bilirubin: 0.6 mg/dL (ref 0.3–1.2)
Total Protein: 6.6 g/dL (ref 6.5–8.1)

## 2018-06-12 LAB — I-STAT CG4 LACTIC ACID, ED: LACTIC ACID, VENOUS: 2.53 mmol/L — AB (ref 0.5–1.9)

## 2018-06-12 LAB — LIPASE, BLOOD: LIPASE: 23 U/L (ref 11–51)

## 2018-06-12 LAB — PROTIME-INR
INR: 1.53
PROTHROMBIN TIME: 18.3 s — AB (ref 11.4–15.2)

## 2018-06-12 MED ORDER — VANCOMYCIN HCL IN DEXTROSE 1-5 GM/200ML-% IV SOLN
1000.0000 mg | Freq: Once | INTRAVENOUS | Status: AC
  Administered 2018-06-13: 1000 mg via INTRAVENOUS
  Filled 2018-06-12: qty 200

## 2018-06-12 MED ORDER — ZOLPIDEM TARTRATE 5 MG PO TABS: 5.0000 mg | ORAL_TABLET | Freq: Every evening | ORAL | Status: DC | PRN

## 2018-06-12 MED ORDER — HYDROMORPHONE HCL 1 MG/ML IJ SOLN
1.0000 mg | INTRAMUSCULAR | Status: DC | PRN
  Administered 2018-06-12 – 2018-06-13 (×3): 1 mg via INTRAVENOUS
  Filled 2018-06-12 (×3): qty 1

## 2018-06-12 MED ORDER — DEXTROSE-NACL 5-0.9 % IV SOLN
INTRAVENOUS | Status: DC
  Administered 2018-06-13 – 2018-06-14 (×4): via INTRAVENOUS

## 2018-06-12 MED ORDER — ONDANSETRON 4 MG PO TBDP: 4.0000 mg | ORAL_TABLET | Freq: Four times a day (QID) | ORAL | Status: DC | PRN

## 2018-06-12 MED ORDER — ONDANSETRON HCL 4 MG/2ML IJ SOLN: 4.0000 mg | Freq: Four times a day (QID) | INTRAMUSCULAR | Status: DC | PRN

## 2018-06-12 MED ORDER — MORPHINE SULFATE (PF) 4 MG/ML IV SOLN
4.0000 mg | Freq: Once | INTRAVENOUS | Status: AC
  Administered 2018-06-12: 4 mg via INTRAMUSCULAR
  Filled 2018-06-12: qty 1

## 2018-06-12 MED ORDER — CEFAZOLIN SODIUM-DEXTROSE 2-4 GM/100ML-% IV SOLN: 2.0000 g | INTRAVENOUS | Status: AC

## 2018-06-12 MED ORDER — OXYCODONE HCL 5 MG PO TABS: 5.0000 mg | ORAL_TABLET | ORAL | Status: DC | PRN

## 2018-06-12 MED ORDER — PIPERACILLIN-TAZOBACTAM 3.375 G IVPB 30 MIN
3.3750 g | Freq: Once | INTRAVENOUS | Status: AC
  Administered 2018-06-12: 3.375 g via INTRAVENOUS
  Filled 2018-06-12: qty 50

## 2018-06-12 MED ORDER — ONDANSETRON 4 MG PO TBDP
4.0000 mg | ORAL_TABLET | Freq: Once | ORAL | Status: AC | PRN
  Administered 2018-06-12: 4 mg via ORAL
  Filled 2018-06-12: qty 1

## 2018-06-12 NOTE — ED Triage Notes (Signed)
Pt presents to ED for assessment after having a lumpectomy Tuesday.  States she noted large increase in swelling last night, and today she cannot keep anything down, including her pain meds.

## 2018-06-12 NOTE — ED Notes (Signed)
Unable to obtain IV.  IV team consult placed.

## 2018-06-12 NOTE — H&P (Signed)
Brittany [name] is an 72 y.o. female.   Chief Complaint: Right breast pain HPI: Patient is a 72 year old female status post right breast sentinel lymph node biopsy and lumpectomy postop day #4.  Patient states that she started her Coumadin on the surgery.  She states that the day after she began her therapeutic Lovenox bridge.  She states that she has been doing well up until last night in which overnight she noticed a doubling of her right breast size.  She states it was tender to touch.  She noticed minimal sanguinous drainage coming from the incision site.  Secondary to this the patient presented to the ED for further evaluation and management.  Past Medical History:  Diagnosis Date  . Chronic back pain   . History of ectopic pregnancy   . History of pulmonary embolism    anticoagulated on coumadin    Past Surgical History:  Procedure Laterality Date  . BREAST BIOPSY Right 05/25/2017   malignant  . BREAST BIOPSY Right 07/14/2017  . BREAST BIOPSY Right 06/22/2017   malignant  . BREAST LUMPECTOMY WITH RADIOACTIVE SEED AND SENTINEL LYMPH NODE BIOPSY Right 06/08/2018   Procedure: RIGHT BREAST LUMPECTOMY WITH DOUBLE BRACKETED RADIOACTIVE SEEDS AND SENTINEL LYMPH NODE BIOPSY ERAS PATHWAY;  Surgeon: [name], MD;  Location: [facility];  Service: General;  Laterality: Right;  TAP BLOCK  . HIP FRACTURE SURGERY    . TUBAL LIGATION Bilateral     Family History  Problem Relation Age of Onset  . Prostate cancer Father   . Prostate cancer Brother    Social History:  reports that she has never smoked. She has never used smokeless tobacco. She reports that she does not drink alcohol or use drugs.  Allergies: No Known Allergies   (Not in a hospital admission)  Results for orders placed or performed during the hospital encounter of 06/12/18 (from the past 48 hour(s))  Lipase, blood     Status: None   Collection Time: 06/12/18  5:26 PM  Result Value Ref Range   Lipase  23 11 - 51 U/L    Comment: Performed at [facility], [place] [address], [place], [place] [address]  Comprehensive metabolic panel     Status: Abnormal   Collection Time: 06/12/18  5:26 PM  Result Value Ref Range   Sodium 138 135 - 145 mmol/L   Potassium 4.4 3.5 - 5.1 mmol/L   Chloride 105 98 - 111 mmol/L   CO2 23 22 - 32 mmol/L   Glucose, Bld 183 (H) 70 - 99 mg/dL   BUN 10 8 - 23 mg/dL   Creatinine, Ser 0.35 (L) 0.44 - 1.00 mg/dL   Calcium 9.0 8.9 - 10.3 mg/dL   Total Protein 6.6 6.5 - 8.1 g/dL   Albumin 3.4 (L) 3.5 - 5.0 g/dL   AST 76 (H) 15 - 41 U/L   ALT 146 (H) 0 - 44 U/L   Alkaline Phosphatase 122 38 - 126 U/L   Total Bilirubin 0.6 0.3 - 1.2 mg/dL   GFR calc non Af Amer >60 >60 mL/min   GFR calc Af Amer >60 >60 mL/min    Comment: (NOTE) The eGFR has been calculated using the CKD EPI equation. This calculation has not been validated in all clinical situations. eGFR's persistently <60 mL/min signify possible Chronic Kidney Disease.    Anion gap 10 5 - 15    Comment: Performed at [facility] [address], [place], [place]  [address]  CBC     Status: Abnormal   Collection Time: 06/12/18  5:26 PM  Result Value Ref Range   WBC 13.1 (H) 4.0 - 10.5 K/uL   RBC 3.76 (L) 3.87 - 5.11 MIL/uL   Hemoglobin 9.4 (L) 12.0 - 15.0 g/dL   HCT 31.5 (L) 36.0 - 46.0 %   MCV 83.8 78.0 - 100.0 fL   MCH 25.0 (L) 26.0 - 34.0 pg   MCHC 29.8 (L) 30.0 - 36.0 g/dL   RDW 14.9 11.5 - 15.5 %   Platelets 323 150 - 400 K/uL    Comment: Performed at [facility] [address] [place], [place] [address]  I-Stat CG4 Lactic Acid, ED     Status: Abnormal   Collection Time: 06/12/18  7:16 PM  Result Value Ref Range   Lactic Acid, Venous 2.53 (HH) 0.5 - 1.9 mmol/L   Comment NOTIFIED PHYSICIAN    No results found.  Review of Systems  Constitutional: Negative for chills, fever and malaise/fatigue.  HENT: Negative for ear discharge, hearing loss and sore throat.   Eyes: Negative for  blurred vision and discharge.  Respiratory: Negative for cough and shortness of breath.   Cardiovascular: Negative for chest pain, orthopnea and leg swelling.  Gastrointestinal: Negative for abdominal pain, constipation, diarrhea, heartburn, nausea and vomiting.  Musculoskeletal: Negative for myalgias and neck pain.  Skin: Negative for itching and rash.  Neurological: Negative for dizziness, focal weakness, seizures and loss of consciousness.  Endo/Heme/Allergies: Negative for environmental allergies. Does not bruise/bleed easily.  Psychiatric/Behavioral: Negative for depression and suicidal ideas.  All other systems reviewed and are negative.   Blood pressure 130/66, pulse (!) 122, temperature 98.2 F (36.8 C), temperature source Oral, resp. rate 18, SpO2 98 %. Physical Exam  Constitutional: She is oriented to person, place, and time. Vital signs are normal. She appears well-developed and well-nourished.  Conversant No acute distress  Eyes: Lids are normal. No scleral icterus.  No lid lag Moist conjunctiva  Neck: No tracheal tenderness present. No thyromegaly present.  No cervical lymphadenopathy  Cardiovascular: Normal rate, regular rhythm and intact distal pulses.  No murmur heard. Respiratory: Effort normal and breath sounds normal. She has no wheezes. She has no rales.    GI: There is no hepatosplenomegaly. There is no tenderness. No hernia.  Neurological: She is alert and oriented to person, place, and time.  Normal gait and station  Skin: Skin is warm. No rash noted. No cyanosis. Nails show no clubbing.  Normal skin turgor  Psychiatric: Judgment normal.  Appropriate affect     Assessment/Plan 72 year old female status post right breast lumpectomy and sentinel lymph node biopsy, postop day 4 with post operative breast hematoma 1.  Patient's INR within normal range.  Patient with ongoing therapeutic Lovenox.  We will hold her anticoagulation tonight with plans for the  operating room tomorrow and evacuation of hematoma.  Patient's INR is within normal limits. 2.  I discussed with the patient and her husband the plans.  Brittany Ivan, MD 06/12/2018, 10:15 PM

## 2018-06-12 NOTE — ED Notes (Signed)
Dr Reather Converse informed of lactic acid results 2.53

## 2018-06-12 NOTE — ED Notes (Signed)
NF Carla informed of lactic acid resutls 2.53

## 2018-06-12 NOTE — ED Provider Notes (Signed)
[facility] EMERGENCY DEPARTMENT Provider Note   CSN: [id number] Arrival date & time: 06/12/18  1703     History   Chief Complaint Chief Complaint  Patient presents with  . post-surgical swelling    HPI Brittany [name] is a 72 y.o. female.  HPI  72 yo female ho pe on anticoagulants, s/p lumpectomy on Tuesday with increasing swelling and pain.  She restarted coumadin after surgery.  She has not noted fever or discharge.  She has associated nausea and vomiting.    Past Medical History:  Diagnosis Date  . Chronic back pain   . History of ectopic pregnancy   . History of pulmonary embolism    anticoagulated on coumadin    Patient Active Problem List   Diagnosis Date Noted  . Malignant neoplasm of upper-outer quadrant of right breast in female, estrogen receptor positive ([facility]) 06/25/2017    Past Surgical History:  Procedure Laterality Date  . BREAST BIOPSY Right 05/25/2017   malignant  . BREAST BIOPSY Right 07/14/2017  . BREAST BIOPSY Right 06/22/2017   malignant  . BREAST LUMPECTOMY WITH RADIOACTIVE SEED AND SENTINEL LYMPH NODE BIOPSY Right 06/08/2018   Procedure: RIGHT BREAST LUMPECTOMY WITH DOUBLE BRACKETED RADIOACTIVE SEEDS AND SENTINEL LYMPH NODE BIOPSY ERAS PATHWAY;  Surgeon: [name], MD;  Location: [facility];  Service: General;  Laterality: Right;  TAP BLOCK  . HIP FRACTURE SURGERY    . TUBAL LIGATION Bilateral      OB History   None      Home Medications    Prior to Admission medications   Medication Sig Start Date End Date Taking? Authorizing Provider  acetaminophen (TYLENOL) 500 MG tablet Take 500 mg by mouth every 6 (six) hours as needed.    [provider]  anastrozole (ARIMIDEX) 1 MG tablet Take 1 tablet (1 mg total) by mouth daily. 11/16/17   [name], MD  Calcium 150 MG TABS Take by mouth.    [provider]  cholecalciferol (VITAMIN D) 1000 units tablet Take by mouth daily.     [provider]  HYDROcodone-acetaminophen (NORCO) 5-325 MG tablet Take 1-2 tablets by mouth every 6 (six) hours as needed for moderate pain or severe pain. 06/08/18   [name], MD  magnesium 30 MG tablet Take 1 tablet by mouth daily.    [provider]  warfarin (COUMADIN) 5 MG tablet Take 1 tablet (5 mg total) by mouth daily. Except Tue and Thu 06/25/17   [name], MD  warfarin (COUMADIN) 7.5 MG tablet Take 1 tablet (7.5 mg total) by mouth daily. Tue and Thu 06/25/17   [name], MD    Family History Family History  Problem Relation Age of Onset  . Prostate cancer Father   . Prostate cancer Brother     Social History Social History   Tobacco Use  . Smoking status: Never Smoker  . Smokeless tobacco: Never Used  Substance Use Topics  . Alcohol use: No  . Drug use: No     Allergies   Patient has no known allergies.   Review of Systems Review of Systems  All other systems reviewed and are negative.    Physical Exam Updated Vital Signs BP 130/66   Pulse (!) 122   Temp 98.2 F (36.8 C) (Oral)   Resp 18   SpO2 98%   Physical Exam  Constitutional: She is oriented to person, place, and time. She appears well-developed and well-nourished.  HENT:  Head: Normocephalic and atraumatic.  Right Ear: External ear normal.  Left Ear: External ear normal.  Mouth/Throat: Oropharynx is clear and moist.  Eyes: Pupils are equal, round, and reactive to light. EOM are normal.  Neck: Normal range of motion.  Cardiovascular: Tachycardia present.  Right breast swollen indurated and ttp  Pulmonary/Chest: Effort normal.  Abdominal: Soft. Bowel sounds are normal.  Neurological: She is alert and oriented to person, place, and time.  Skin: Skin is warm. Capillary refill takes less than 2 seconds.  Psychiatric: She has a normal mood and affect.  Nursing note and vitals reviewed.    ED Treatments / Results  Labs (all labs ordered are listed, but only  abnormal results are displayed) Labs Reviewed  COMPREHENSIVE METABOLIC PANEL - Abnormal; Notable for the following components:      Result Value   Glucose, Bld 183 (*)    Creatinine, Ser 0.35 (*)    Albumin 3.4 (*)    AST 76 (*)    ALT 146 (*)    All other components within normal limits  CBC - Abnormal; Notable for the following components:   WBC 13.1 (*)    RBC 3.76 (*)    Hemoglobin 9.4 (*)    HCT 31.5 (*)    MCH 25.0 (*)    MCHC 29.8 (*)    All other components within normal limits  I-STAT CG4 LACTIC ACID, ED - Abnormal; Notable for the following components:   Lactic Acid, Venous 2.53 (*)    All other components within normal limits  LIPASE, BLOOD  URINALYSIS, ROUTINE W REFLEX MICROSCOPIC  PROTIME-INR  I-STAT CG4 LACTIC ACID, ED    EKG None  Radiology No results found.  Procedures Procedures (including critical care time)  Medications Ordered in ED Medications  ondansetron (ZOFRAN-ODT) disintegrating tablet 4 mg (4 mg Oral Given 06/12/18 1729)     Initial Impression / Assessment and Plan / ED Course  I have reviewed the triage vital signs and the nursing notes.  Pertinent labs & imaging results that were available during my care of the patient were reviewed by me and considered in my medical decision making (see chart for details).     72 year old female status post right lumpectomy on Tuesday presents today with swelling and pain in the right breast.  Right breast is very swollen but does not appear red or warm.  IV antibiotics ordered here in the ED.  Discussed with Dr. Rosendo Gros, on-call for surgery.  He is seen and evaluated the patient in the ED.  Since INR is still pending.  He plans to admit the patient for likely surgical intervention tomorrow.  Final Clinical Impressions(s) / ED Diagnoses   Final diagnoses:  Breast swelling    ED Discharge Orders    None       Pattricia Boss, MD 06/12/18 2159

## 2018-06-12 NOTE — ED Notes (Signed)
IV team to bedside. 

## 2018-06-12 NOTE — ED Notes (Signed)
Pt given crackers, peanut butter and water. 

## 2018-06-12 NOTE — ED Notes (Signed)
Per surgery, wrapped pt's chest w/ ace bandages.

## 2018-06-13 ENCOUNTER — Encounter (HOSPITAL_COMMUNITY): Payer: Self-pay | Admitting: Certified Registered Nurse Anesthetist

## 2018-06-13 ENCOUNTER — Observation Stay (HOSPITAL_COMMUNITY): Payer: Medicare Other | Admitting: Certified Registered Nurse Anesthetist

## 2018-06-13 ENCOUNTER — Encounter (HOSPITAL_COMMUNITY)
Admission: EM | Disposition: A | Discharge status: Home / Self Care | Payer: Self-pay | Source: Home / Self Care | Attending: General Surgery

## 2018-06-13 ENCOUNTER — Observation Stay (HOSPITAL_COMMUNITY): Payer: Medicare Other

## 2018-06-13 DIAGNOSIS — Z8042 Family history of malignant neoplasm of prostate: Secondary | ICD-10-CM | POA: Diagnosis not present

## 2018-06-13 DIAGNOSIS — L7632 Postprocedural hematoma of skin and subcutaneous tissue following other procedure: Secondary | ICD-10-CM | POA: Diagnosis not present

## 2018-06-13 DIAGNOSIS — R609 Edema, unspecified: Secondary | ICD-10-CM

## 2018-06-13 DIAGNOSIS — Z86718 Personal history of other venous thrombosis and embolism: Secondary | ICD-10-CM | POA: Diagnosis not present

## 2018-06-13 DIAGNOSIS — Z86711 Personal history of pulmonary embolism: Secondary | ICD-10-CM | POA: Diagnosis not present

## 2018-06-13 DIAGNOSIS — C50919 Malignant neoplasm of unspecified site of unspecified female breast: Secondary | ICD-10-CM | POA: Diagnosis not present

## 2018-06-13 DIAGNOSIS — Z7901 Long term (current) use of anticoagulants: Secondary | ICD-10-CM | POA: Diagnosis not present

## 2018-06-13 DIAGNOSIS — Z9851 Tubal ligation status: Secondary | ICD-10-CM | POA: Diagnosis not present

## 2018-06-13 DIAGNOSIS — R000 Tachycardia, unspecified: Secondary | ICD-10-CM | POA: Diagnosis not present

## 2018-06-13 DIAGNOSIS — N6489 Other specified disorders of breast: Secondary | ICD-10-CM | POA: Diagnosis not present

## 2018-06-13 DIAGNOSIS — D62 Acute posthemorrhagic anemia: Secondary | ICD-10-CM | POA: Diagnosis not present

## 2018-06-13 DIAGNOSIS — I82612 Acute embolism and thrombosis of superficial veins of left upper extremity: Secondary | ICD-10-CM | POA: Diagnosis not present

## 2018-06-13 DIAGNOSIS — I82622 Acute embolism and thrombosis of deep veins of left upper extremity: Secondary | ICD-10-CM | POA: Diagnosis not present

## 2018-06-13 DIAGNOSIS — L7622 Postprocedural hemorrhage and hematoma of skin and subcutaneous tissue following other procedure: Secondary | ICD-10-CM | POA: Diagnosis not present

## 2018-06-13 DIAGNOSIS — N644 Mastodynia: Secondary | ICD-10-CM | POA: Diagnosis not present

## 2018-06-13 HISTORY — PX: MINOR BREAST BIOPSY: SHX5977

## 2018-06-13 LAB — CBC
HCT: 29.7 % — ABNORMAL LOW (ref 36.0–46.0)
HEMOGLOBIN: 9.1 g/dL — AB (ref 12.0–15.0)
MCH: 25.5 pg — AB (ref 26.0–34.0)
MCHC: 30.6 g/dL (ref 30.0–36.0)
MCV: 83.2 fL (ref 78.0–100.0)
Platelets: 304 10*3/uL (ref 150–400)
RBC: 3.57 MIL/uL — ABNORMAL LOW (ref 3.87–5.11)
RDW: 15.1 % (ref 11.5–15.5)
WBC: 14.5 10*3/uL — ABNORMAL HIGH (ref 4.0–10.5)

## 2018-06-13 LAB — SURGICAL PCR SCREEN
MRSA, PCR: NEGATIVE
STAPHYLOCOCCUS AUREUS: NEGATIVE

## 2018-06-13 LAB — URINALYSIS, ROUTINE W REFLEX MICROSCOPIC
BILIRUBIN URINE: NEGATIVE
Bacteria, UA: NONE SEEN
Glucose, UA: NEGATIVE mg/dL
Hgb urine dipstick: NEGATIVE
Ketones, ur: 20 mg/dL — AB
Nitrite: NEGATIVE
Protein, ur: 30 mg/dL — AB
SPECIFIC GRAVITY, URINE: 1.028 (ref 1.005–1.030)
WBC, UA: >50 WBC/hpf — ABNORMAL HIGH (ref 0–5)
pH: 5 (ref 5.0–8.0)

## 2018-06-13 SURGERY — MINOR BREAST BIOPSY
Anesthesia: General | Site: Breast | Laterality: Right

## 2018-06-13 MED ORDER — LACTATED RINGERS IV SOLN
INTRAVENOUS | Status: DC | PRN
  Administered 2018-06-13: 09:00:00 via INTRAVENOUS

## 2018-06-13 MED ORDER — FENTANYL CITRATE (PF) 100 MCG/2ML IJ SOLN: 25.0000 ug | INTRAMUSCULAR | Status: DC | PRN

## 2018-06-13 MED ORDER — BUPIVACAINE-EPINEPHRINE (PF) 0.5% -1:200000 IJ SOLN
INTRAMUSCULAR | Status: AC
  Filled 2018-06-13: qty 30

## 2018-06-13 MED ORDER — PHENYLEPHRINE 40 MCG/ML (10ML) SYRINGE FOR IV PUSH (FOR BLOOD PRESSURE SUPPORT)
PREFILLED_SYRINGE | INTRAVENOUS | Status: AC
  Filled 2018-06-13: qty 10

## 2018-06-13 MED ORDER — CEFAZOLIN SODIUM 1 G IJ SOLR
INTRAMUSCULAR | Status: AC
  Filled 2018-06-13: qty 20

## 2018-06-13 MED ORDER — BUPIVACAINE-EPINEPHRINE 0.5% -1:200000 IJ SOLN
INTRAMUSCULAR | Status: DC | PRN
  Administered 2018-06-13: 20 mL

## 2018-06-13 MED ORDER — ONDANSETRON HCL 4 MG/2ML IJ SOLN
INTRAMUSCULAR | Status: AC
  Filled 2018-06-13: qty 2

## 2018-06-13 MED ORDER — CEFAZOLIN SODIUM-DEXTROSE 2-3 GM-%(50ML) IV SOLR
INTRAVENOUS | Status: DC | PRN
  Administered 2018-06-13: 2 g via INTRAVENOUS

## 2018-06-13 MED ORDER — SODIUM CHLORIDE 0.9 % IV SOLN
INTRAVENOUS | Status: DC | PRN
  Administered 2018-06-13: 75 ug/min via INTRAVENOUS

## 2018-06-13 MED ORDER — MUPIROCIN 2 % EX OINT: 1.0000 "application " | TOPICAL_OINTMENT | Freq: Two times a day (BID) | CUTANEOUS | Status: DC

## 2018-06-13 MED ORDER — 0.9 % SODIUM CHLORIDE (POUR BTL) OPTIME
TOPICAL | Status: DC | PRN
  Administered 2018-06-13: 1000 mL

## 2018-06-13 MED ORDER — ONDANSETRON HCL 4 MG/2ML IJ SOLN
INTRAMUSCULAR | Status: DC | PRN
  Administered 2018-06-13: 4 mg via INTRAVENOUS

## 2018-06-13 MED ORDER — PROPOFOL 10 MG/ML IV BOLUS
INTRAVENOUS | Status: AC
  Filled 2018-06-13: qty 40

## 2018-06-13 MED ORDER — FENTANYL CITRATE (PF) 250 MCG/5ML IJ SOLN
INTRAMUSCULAR | Status: DC | PRN
  Administered 2018-06-13: 50 ug via INTRAVENOUS

## 2018-06-13 MED ORDER — LIDOCAINE 2% (20 MG/ML) 5 ML SYRINGE
INTRAMUSCULAR | Status: AC
  Filled 2018-06-13: qty 5

## 2018-06-13 MED ORDER — MEPERIDINE HCL 50 MG/ML IJ SOLN: 6.2500 mg | INTRAMUSCULAR | Status: DC | PRN

## 2018-06-13 MED ORDER — LIDOCAINE 2% (20 MG/ML) 5 ML SYRINGE
INTRAMUSCULAR | Status: DC | PRN
  Administered 2018-06-13: 40 mg via INTRAVENOUS

## 2018-06-13 MED ORDER — LACTATED RINGERS IV SOLN: INTRAVENOUS | Status: DC

## 2018-06-13 MED ORDER — WARFARIN - PHARMACIST DOSING INPATIENT
Freq: Every day | Status: DC
  Administered 2018-06-14: 19:00:00

## 2018-06-13 MED ORDER — DEXAMETHASONE SODIUM PHOSPHATE 10 MG/ML IJ SOLN
INTRAMUSCULAR | Status: DC | PRN
  Administered 2018-06-13: 10 mg via INTRAVENOUS

## 2018-06-13 MED ORDER — WARFARIN SODIUM 7.5 MG PO TABS
7.5000 mg | ORAL_TABLET | Freq: Once | ORAL | Status: AC
Start: 2018-06-13 — End: 2018-06-13
  Administered 2018-06-13: 7.5 mg via ORAL
  Filled 2018-06-13: qty 1

## 2018-06-13 MED ORDER — PROPOFOL 10 MG/ML IV BOLUS
INTRAVENOUS | Status: DC | PRN
  Administered 2018-06-13: 160 mg via INTRAVENOUS

## 2018-06-13 MED ORDER — DEXAMETHASONE SODIUM PHOSPHATE 10 MG/ML IJ SOLN
INTRAMUSCULAR | Status: AC
  Filled 2018-06-13: qty 1

## 2018-06-13 MED ORDER — PHENYLEPHRINE 40 MCG/ML (10ML) SYRINGE FOR IV PUSH (FOR BLOOD PRESSURE SUPPORT)
PREFILLED_SYRINGE | INTRAVENOUS | Status: DC | PRN
  Administered 2018-06-13: 120 ug via INTRAVENOUS
  Administered 2018-06-13: 80 ug via INTRAVENOUS
  Administered 2018-06-13: 120 ug via INTRAVENOUS
  Administered 2018-06-13 (×2): 160 ug via INTRAVENOUS
  Administered 2018-06-13 (×2): 80 ug via INTRAVENOUS

## 2018-06-13 MED ORDER — PROMETHAZINE HCL 25 MG/ML IJ SOLN: 6.2500 mg | INTRAMUSCULAR | Status: DC | PRN

## 2018-06-13 MED ORDER — FENTANYL CITRATE (PF) 250 MCG/5ML IJ SOLN
INTRAMUSCULAR | Status: AC
  Filled 2018-06-13: qty 5

## 2018-06-13 SURGICAL SUPPLY — 28 items
ADH SKN CLS LQ APL DERMABOND (GAUZE/BANDAGES/DRESSINGS) ×1
BINDER BREAST XLRG (GAUZE/BANDAGES/DRESSINGS) ×2 IMPLANT
CANISTER SUCT 3000ML PPV (MISCELLANEOUS) ×3 IMPLANT
CHLORAPREP W/TINT 26ML (MISCELLANEOUS) ×3 IMPLANT
COVER SURGICAL LIGHT HANDLE (MISCELLANEOUS) ×3 IMPLANT
DERMABOND ADHESIVE PROPEN (GAUZE/BANDAGES/DRESSINGS) ×2
DERMABOND ADVANCED .7 DNX6 (GAUZE/BANDAGES/DRESSINGS) IMPLANT
DRAPE LAPAROSCOPIC ABDOMINAL (DRAPES) ×3 IMPLANT
ELECT CAUTERY BLADE 6.4 (BLADE) ×3 IMPLANT
ELECT REM PT RETURN 9FT ADLT (ELECTROSURGICAL) ×3
ELECTRODE REM PT RTRN 9FT ADLT (ELECTROSURGICAL) ×1 IMPLANT
GLOVE SURG SIGNA 7.5 PF LTX (GLOVE) ×3 IMPLANT
GOWN STRL REUS W/ TWL LRG LVL3 (GOWN DISPOSABLE) ×2 IMPLANT
GOWN STRL REUS W/ TWL XL LVL3 (GOWN DISPOSABLE) ×1 IMPLANT
GOWN STRL REUS W/TWL LRG LVL3 (GOWN DISPOSABLE) ×3
GOWN STRL REUS W/TWL XL LVL3 (GOWN DISPOSABLE) ×3
KIT BASIN OR (CUSTOM PROCEDURE TRAY) ×3 IMPLANT
KIT TURNOVER KIT B (KITS) ×3 IMPLANT
NS IRRIG 1000ML POUR BTL (IV SOLUTION) ×3 IMPLANT
PACK GENERAL/GYN (CUSTOM PROCEDURE TRAY) ×3 IMPLANT
PAD ABD 8X10 STRL (GAUZE/BANDAGES/DRESSINGS) ×2 IMPLANT
PAD ARMBOARD 7.5X6 YLW CONV (MISCELLANEOUS) ×3 IMPLANT
STAPLER VISISTAT 35W (STAPLE) ×3 IMPLANT
SUT MNCRL AB 4-0 PS2 18 (SUTURE) ×2 IMPLANT
SUT VIC AB 3-0 SH 27 (SUTURE) ×3
SUT VIC AB 3-0 SH 27X BRD (SUTURE) IMPLANT
TOWEL OR 17X24 6PK STRL BLUE (TOWEL DISPOSABLE) ×3 IMPLANT
TOWEL OR 17X26 10 PK STRL BLUE (TOWEL DISPOSABLE) ×3 IMPLANT

## 2018-06-13 NOTE — Transfer of Care (Signed)
Immediate Anesthesia Transfer of Care Note  Patient: Brittany [name]  Procedure(s) Performed: EVACUATION OF RIGHT BREAST HEMATOMA (Right Breast)  Patient Location: PACU  Anesthesia Type:General  Level of Consciousness: awake, alert  and oriented  Airway & Oxygen Therapy: Patient Spontanous Breathing  Post-op Assessment: Report given to RN and Post -op Vital signs reviewed and stable  Post vital signs: Reviewed and stable  Last Vitals:  Vitals Value Taken Time  BP 127/79 06/13/2018  9:54 AM  Temp    Pulse 115 06/13/2018  9:55 AM  Resp 12 06/13/2018  9:55 AM  SpO2 100 % 06/13/2018  9:55 AM  Vitals shown include unvalidated device data.  Last Pain:  Vitals:   06/13/18 0981  TempSrc: Oral  PainSc:       Patients Stated Pain Goal: 5 (19/14/78 2956)  Complications: No apparent anesthesia complications

## 2018-06-13 NOTE — Anesthesia Procedure Notes (Signed)
Procedure Name: LMA Insertion Date/Time: 06/13/2018 9:14 AM Performed by: Harden Mo, CRNA Pre-anesthesia Checklist: Patient identified, Emergency Drugs available, Suction available and Patient being monitored Patient Re-evaluated:Patient Re-evaluated prior to induction Oxygen Delivery Method: Circle System Utilized Preoxygenation: Pre-oxygenation with 100% oxygen Induction Type: IV induction Ventilation: Mask ventilation without difficulty LMA: LMA inserted LMA Size: 4.0 Number of attempts: 1 Airway Equipment and Method: Bite block Placement Confirmation: positive ETCO2 Tube secured with: Tape Dental Injury: Teeth and Oropharynx as per pre-operative assessment

## 2018-06-13 NOTE — Progress Notes (Signed)
Patient ID: Brittany [name], female   DOB: 1946-04-30, 72 y.o.   MRN: [id number]   Pre Procedure note for inpatients:   BRIEL GALLICCHIO has been scheduled for Procedure(s): EVACUATION OF RIGHT BREAST HEMATOMA (Right) today. The various methods of treatment have been discussed with the patient. After consideration of the risks, benefits and treatment options the patient has consented to the planned procedure.   The patient has been seen and labs reviewed. There are no changes in the patient's condition to prevent proceeding with the planned procedure today.  Recent labs:  Lab Results  Component Value Date   WBC 14.5 (H) 06/13/2018   HGB 9.1 (L) 06/13/2018   HCT 29.7 (L) 06/13/2018   PLT 304 06/13/2018   GLUCOSE 183 (H) 06/12/2018   ALT 146 (H) 06/12/2018   AST 76 (H) 06/12/2018   NA 138 06/12/2018   K 4.4 06/12/2018   CL 105 06/12/2018   CREATININE 0.35 (L) 06/12/2018   BUN 10 06/12/2018   CO2 23 06/12/2018   INR 1.53 06/12/2018    Vadis Slabach A, MD 06/13/2018 8:51 AM

## 2018-06-13 NOTE — Anesthesia Preprocedure Evaluation (Addendum)
Anesthesia Evaluation  Patient identified by MRN, date of birth, ID band Patient awake    Reviewed: Allergy & Precautions, NPO status , Patient's Chart, lab work & pertinent test results  Airway Mallampati: I  TM Distance: >3 FB Neck ROM: Full    Dental  (+) Teeth Intact, Dental Advisory Given   Pulmonary neg pulmonary ROS,    breath sounds clear to auscultation       Cardiovascular negative cardio ROS   Rhythm:Regular Rate:Normal     Neuro/Psych negative neurological ROS  negative psych ROS   GI/Hepatic negative GI ROS, Neg liver ROS,   Endo/Other  negative endocrine ROS  Renal/GU negative Renal ROS     Musculoskeletal negative musculoskeletal ROS (+)   Abdominal (+) + obese,   Peds  Hematology negative hematology ROS (+)   Anesthesia Other Findings   Reproductive/Obstetrics                            Lab Results  Component Value Date   WBC 14.5 (H) 06/13/2018   HGB 9.1 (L) 06/13/2018   HCT 29.7 (L) 06/13/2018   MCV 83.2 06/13/2018   PLT 304 06/13/2018   Lab Results  Component Value Date   CREATININE 0.35 (L) 06/12/2018   BUN 10 06/12/2018   NA 138 06/12/2018   K 4.4 06/12/2018   CL 105 06/12/2018   CO2 23 06/12/2018   Lab Results  Component Value Date   INR 1.53 06/12/2018   INR 1.11 06/08/2018   INR 2.29 06/04/2018     Anesthesia Physical Anesthesia Plan  ASA: II  Anesthesia Plan: General   Post-op Pain Management:    Induction: Intravenous  PONV Risk Score and Plan: 4 or greater and Ondansetron, Midazolam, Treatment may vary due to age or medical condition and Dexamethasone  Airway Management Planned: LMA  Additional Equipment: None  Intra-op Plan:   Post-operative Plan: Extubation in OR  Informed Consent: I have reviewed the patients History and Physical, chart, labs and discussed the procedure including the risks, benefits and alternatives for the  proposed anesthesia with the patient or authorized representative who has indicated his/her understanding and acceptance.   Dental advisory given  Plan Discussed with: CRNA  Anesthesia Plan Comments:         Anesthesia Quick Evaluation

## 2018-06-13 NOTE — Progress Notes (Signed)
VASCULAR LAB PRELIMINARY  PRELIMINARY  PRELIMINARY  PRELIMINARY  Left upper extremity venous duplex completed.    Preliminary report:  There is acute DVT noted in the left brachial vein and acute superficial thrombosis noted in the left basilic vein at the site of the IV.  Maudry Mayhew, MHA, RVT, RDCS, RDMS     06/13/2018, 4:32 PM

## 2018-06-13 NOTE — Progress Notes (Signed)
Per Vascular Ultrasound Pt is positive for DVT in left brachial and and left basilic veins.  MD notified.  Awaiting for further orders.  Will continue to monitor.

## 2018-06-13 NOTE — Discharge Instructions (Signed)

## 2018-06-13 NOTE — Progress Notes (Signed)
attempt to call report  

## 2018-06-13 NOTE — Op Note (Signed)
EVACUATION OF RIGHT BREAST HEMATOMA  Procedure Note  Brittany [name] 06/13/2018   Pre-op Diagnosis: RIGHT BREAST HEMATOMA     Post-op Diagnosis: same  Procedure(s): EVACUATION OF RIGHT BREAST HEMATOMA  Surgeon(s): [name], MD  Anesthesia: General  Staff:  Circulator: Candi Leash, RN Scrub Person: Sharee Holster, RN  Estimated Blood Loss: Minimal               Indications: This is a 72 year old female who is a patient of Dr. Darrel Hoover.  She had undergone a right lumpectomy and sentinel node biopsy on August 13.  She is on chronic anticoagulation.  She presents with a large postoperative right breast hematoma  Findings: I evacuated just over 1700 cc of liquid hematoma from the right breast.  No active bleeding was identified.  Procedure: The patient was brought to the operating room and identified as correct patient.  She is placed upon the operating table and general anesthesia was induced.  Her right breast was then prepped and draped in usual sterile fashion.  I opened up the skin at the lumpectomy site on the outer quadrant of the right breast with a scalpel.  Immediately a large amount of liquid hematoma came out of the wound.  I suctioned it all out.  It was mostly liquid and not solidified yet.  Over 1700 cc of blood was evacuated.  I then irrigated the wound with a liter of normal saline.  No active bleeding was identified.  There was no areas of even a mild ooze of bleeding or hemorrhage.  At this point, I anesthetized the skin with Marcaine and then closed the subtenons tissue with interrupted 3-0 Vicryl sutures and closed the skin with a running 4-0 Monocryl.  Dermabond and a binder were then applied.  She tolerated the procedure well.  All counts were correct at the end of procedure.  She was then extubated in the operating room and taken in a stable condition to the recovery room          [name]   Date: 06/13/2018  Time: 9:51 AM

## 2018-06-13 NOTE — Anesthesia Postprocedure Evaluation (Signed)
Anesthesia Post Note  Patient: Brittany [name]  Procedure(s) Performed: EVACUATION OF RIGHT BREAST HEMATOMA (Right Breast)     Patient location during evaluation: PACU Anesthesia Type: General Level of consciousness: awake and alert Pain management: pain level controlled Vital Signs Assessment: post-procedure vital signs reviewed and stable Respiratory status: spontaneous breathing, nonlabored ventilation, respiratory function stable and patient connected to nasal cannula oxygen Cardiovascular status: blood pressure returned to baseline and stable Postop Assessment: no apparent nausea or vomiting Anesthetic complications: no    Last Vitals:  Vitals:   06/13/18 1045 06/13/18 1054  BP:  (!) 169/78  Pulse: (!) 108 (!) 110  Resp: 12 15  Temp: (!) 36.3 C   SpO2: 100% 95%    Last Pain:  Vitals:   06/13/18 1045  TempSrc:   PainSc: 0-No pain                 Effie Berkshire

## 2018-06-13 NOTE — Progress Notes (Addendum)
Pt request for PIV.  RA restricted due to recent mastectomy.  LA very swollen from axilla to fingertips.  Fingers cold, elbow region with warmth and mild redness noted. RA normal temperature. Pt describes tightness but not pain in LUE.  No PIV started.  Will notify RN of assessment.  Pt questioned if possible for the swelling to be from a blood clot. RN notified, MD paged, OR tech notified.

## 2018-06-13 NOTE — Progress Notes (Signed)
ANTICOAGULATION CONSULT NOTE - Initial Consult  Pharmacy Consult for Coumadin Indication: Hx of PE  No Known Allergies   Vital Signs: Temp: 97.9 F (36.6 C) (08/18 1116) Temp Source: Oral (08/18 1116) BP: 154/66 (08/18 1116) Pulse Rate: 106 (08/18 1116)  Labs: Recent Labs    06/12/18 1726 06/12/18 2213 06/13/18 0126  HGB 9.4*  --  9.1*  HCT 31.5*  --  29.7*  PLT 323  --  304  LABPROT  --  18.3*  --   INR  --  1.53  --   CREATININE 0.35*  --   --     Estimated Creatinine Clearance: 75.2 mL/min (A) (by C-G formula based on SCr of 0.35 mg/dL (L)).   Medical History: Past Medical History:  Diagnosis Date  . Chronic back pain   . History of ectopic pregnancy   . History of pulmonary embolism    anticoagulated on coumadin    Assessment: 72 yo F presents on Coumadin 5mg  daily exc 7.5mg  on Tues PTA for hx of PE. Held for breast biopsy and lumpectomy. Now to restart on 8/18. INR 1.53 today. Hgb 9.1, plts wnl  Goal of Therapy:  INR 2-3 Monitor platelets by anticoagulation protocol: Yes   Plan:  Give Coumadin 7.5mg  PO x 1 Monitor daily INR, CBC, s/s of bleed  Brittany [name] J 06/13/2018,11:17 AM

## 2018-06-14 ENCOUNTER — Encounter (HOSPITAL_COMMUNITY): Payer: Self-pay | Admitting: General Surgery

## 2018-06-14 DIAGNOSIS — I82622 Acute embolism and thrombosis of deep veins of left upper extremity: Secondary | ICD-10-CM

## 2018-06-14 DIAGNOSIS — Z9851 Tubal ligation status: Secondary | ICD-10-CM | POA: Diagnosis not present

## 2018-06-14 DIAGNOSIS — Y763 Surgical instruments, materials and obstetric and gynecological devices (including sutures) associated with adverse incidents: Secondary | ICD-10-CM | POA: Diagnosis not present

## 2018-06-14 DIAGNOSIS — Z7901 Long term (current) use of anticoagulants: Secondary | ICD-10-CM | POA: Diagnosis not present

## 2018-06-14 DIAGNOSIS — N63 Unspecified lump in unspecified breast: Secondary | ICD-10-CM | POA: Diagnosis not present

## 2018-06-14 DIAGNOSIS — I82612 Acute embolism and thrombosis of superficial veins of left upper extremity: Secondary | ICD-10-CM | POA: Diagnosis present

## 2018-06-14 DIAGNOSIS — L7632 Postprocedural hematoma of skin and subcutaneous tissue following other procedure: Secondary | ICD-10-CM | POA: Diagnosis present

## 2018-06-14 DIAGNOSIS — Z86711 Personal history of pulmonary embolism: Secondary | ICD-10-CM | POA: Diagnosis not present

## 2018-06-14 DIAGNOSIS — R000 Tachycardia, unspecified: Secondary | ICD-10-CM | POA: Diagnosis present

## 2018-06-14 DIAGNOSIS — D62 Acute posthemorrhagic anemia: Secondary | ICD-10-CM | POA: Diagnosis present

## 2018-06-14 DIAGNOSIS — G8929 Other chronic pain: Secondary | ICD-10-CM | POA: Diagnosis present

## 2018-06-14 DIAGNOSIS — Z8042 Family history of malignant neoplasm of prostate: Secondary | ICD-10-CM | POA: Diagnosis not present

## 2018-06-14 DIAGNOSIS — C50919 Malignant neoplasm of unspecified site of unspecified female breast: Secondary | ICD-10-CM | POA: Diagnosis present

## 2018-06-14 DIAGNOSIS — M549 Dorsalgia, unspecified: Secondary | ICD-10-CM | POA: Diagnosis present

## 2018-06-14 DIAGNOSIS — Y838 Other surgical procedures as the cause of abnormal reaction of the patient, or of later complication, without mention of misadventure at the time of the procedure: Secondary | ICD-10-CM | POA: Diagnosis present

## 2018-06-14 DIAGNOSIS — N644 Mastodynia: Secondary | ICD-10-CM | POA: Diagnosis present

## 2018-06-14 DIAGNOSIS — Z86718 Personal history of other venous thrombosis and embolism: Secondary | ICD-10-CM | POA: Diagnosis not present

## 2018-06-14 HISTORY — DX: Acute embolism and thrombosis of deep veins of left upper extremity: I82.622

## 2018-06-14 LAB — CBC
HCT: 19.9 % — ABNORMAL LOW (ref 36.0–46.0)
Hemoglobin: 6 g/dL — CL (ref 12.0–15.0)
MCH: 25.3 pg — AB (ref 26.0–34.0)
MCHC: 30.2 g/dL (ref 30.0–36.0)
MCV: 84 fL (ref 78.0–100.0)
PLATELETS: 197 10*3/uL (ref 150–400)
RBC: 2.37 MIL/uL — ABNORMAL LOW (ref 3.87–5.11)
RDW: 15.5 % (ref 11.5–15.5)
WBC: 12.4 10*3/uL — ABNORMAL HIGH (ref 4.0–10.5)

## 2018-06-14 LAB — HEMOGLOBIN AND HEMATOCRIT, BLOOD
HEMATOCRIT: 25.6 % — AB (ref 36.0–46.0)
Hemoglobin: 8.1 g/dL — ABNORMAL LOW (ref 12.0–15.0)

## 2018-06-14 LAB — ABO/RH: ABO/RH(D): A POS

## 2018-06-14 LAB — PREPARE RBC (CROSSMATCH)

## 2018-06-14 MED ORDER — WARFARIN SODIUM 7.5 MG PO TABS
7.5000 mg | ORAL_TABLET | Freq: Once | ORAL | Status: AC
  Administered 2018-06-14: 7.5 mg via ORAL
  Filled 2018-06-14: qty 1

## 2018-06-14 MED ORDER — SODIUM CHLORIDE 0.9% IV SOLUTION
Freq: Once | INTRAVENOUS | Status: AC
  Administered 2018-06-14: 11:00:00 via INTRAVENOUS

## 2018-06-14 NOTE — Progress Notes (Signed)
Surgery:  Hemoglobin was repeated this morning and was 6.1. This may explain the borderline tachycardia Again, her physical exam did not suggest ongoing bleeding, so I think this may represent re-equilibration following evacuation of hematoma and rehydration.  Discharge canceled Transfuse 2 units PRBC Recheck H&H today [place] 10:15 AM

## 2018-06-14 NOTE — Plan of Care (Signed)
  Problem: Activity: Goal: Risk for activity intolerance will decrease Outcome: Progressing   Problem: Nutrition: Goal: Adequate nutrition will be maintained Outcome: Progressing   

## 2018-06-14 NOTE — Progress Notes (Signed)
ANTICOAGULATION CONSULT NOTE - Follow Up Consult  Pharmacy Consult for Coumain Indication: h/o VTE  No Known Allergies  Patient Measurements:   Heparin Dosing Weight:    Vital Signs: Temp: 98.2 F (36.8 C) (08/19 0908) Temp Source: Oral (08/19 0908) BP: 125/46 (08/19 0911) Pulse Rate: 98 (08/19 0911)  Labs: Recent Labs    06/12/18 1726 06/12/18 2213 06/13/18 0126 06/14/18 0602  HGB 9.4*  --  9.1* 6.0*  HCT 31.5*  --  29.7* 19.9*  PLT 323  --  304 197  LABPROT  --  18.3*  --   --   INR  --  1.53  --   --   CREATININE 0.35*  --   --   --     Estimated Creatinine Clearance: 75.2 mL/min (A) (by C-G formula based on SCr of 0.35 mg/dL (L)).   Assessment:  Anticoag: On Coumadin PTA for hx of PE. Held for breast biopsy and lumpectomy. S/p evacuation of R breast hematoma. Restarted Coumadin 8/18 with INR 1.53. INR not ordered 8/19.  Hgb 9.1>6, Plts 304>197. Tranfuse. See MD note. - PTA dose 5mg  daily exc 7.5mg  on Tues    Goal of Therapy:  INR 2-3 Monitor platelets by anticoagulation protocol: Yes   Plan:  Cancel discharge today and transfuse Give Coumadin 7.5mg  PO x 1 Monitor daily INR, CBC, s/s of bleed  Katheleen Stella S. Alford Highland, PharmD, [facility] Clinical Staff Pharmacist Pager [phone]  Eilene Ghazi Stillinger 06/14/2018,10:41 AM

## 2018-06-14 NOTE — Progress Notes (Addendum)
CRITICAL VALUE ALERT  Critical Value:  hemoglobin  Date & Time Notied:  8/19.19 0645  Provider Notified: Dr. Ninfa Linden  Orders Received/Actions taken: 2 units of blood, cancel d/c, post transfusion H&H, labs @0500  06/15/18

## 2018-06-14 NOTE — Progress Notes (Signed)
Surgery:   She feels fine this afternoon.  No complaints other than wanting to go home. She states no increase in swelling or pain in her right breast Receiving blood transfusion currently Vital signs stable  Right breast exam reveals the breast is soft.  Some ecchymoses.  Does not seem any more swollen or tender than this morning.  Clinically it does not appear that she has any ongoing active bleeding Hopefully she will have an appropriate rise in her hemoglobin following transfusion   Leelan Rajewski M. Dalbert Batman, M.D., [facility] General and Minimally invasive Surgery Breast and Colorectal Surgery

## 2018-06-14 NOTE — Discharge Summary (Addendum)
Patient ID: Brittany [name] [id number] 72 y.o. August 13, 1946  Admit date: 06/12/2018  Discharge date and time: 06/15/2018  Admitting Physician: Wilburn Mylar  Discharge Physician: Adin Hector  Admission Diagnoses: Breast swelling [N63.0]  Discharge Diagnoses: Postop hematoma right breast                                         Left brachial vein DVT                                         Anticoagulated on Coumadin                                         Primary cancer upper outer quadrant right female breast                                         History neoadjuvant letrozole antiestrogen therapy                                         History of pulmonary embolism                                         History ectopic pregnancy and tubal ligation                                           Operations: Procedure(s): EVACUATION OF RIGHT BREAST HEMATOMA  Admission Condition: fair  Discharged Condition: fair  Indication for Admission: This is a 72 year old female who is a patient of Dr. Darrel Hoover.  She had undergone a right lumpectomy and sentinel node biopsy on August 13.  She is on chronic anticoagulation.    Dr. Dalbert Batman spoke with her on the phone on Friday, August 16 and she was doing well.  We discussed her pathology at that point.  She presents with a large postoperative right breast hematoma which had developed over the weekend.  Hospital Course: The patient was admitted.  She was hemodynamically stable.  Hemoglobin was 9.4 on admission.  It had been 11 one week prior.  Prothrombin time was 18.3 with INR 1.53.  She had been taking Coumadin at home      She was taken to the operating room and Dr. Ninfa Linden evacuated a large liquefied hematoma from the right breast lumpectomy incision.  He found no active bleeding and closed the wound VAC with several layers.      She developed some left arm swelling and the IV was removed and venous Doppler showed some thrombosis in the left  basilic vein.      She was seen by pharmacy and continued her Coumadin as she had been taking at home and was given a dose on August 18.      She did well overnight.  Had no  rebleeding.  Breast pain was markedly improved and she asked if she could go home.       Examination on the morning of 06/14/2018 revealed that she was happy and in good spirits.  The right breast was ecchymotic but was quite soft and there was no sign of any rebleeding in the wound.  The IV was found in her right arm.  The IV in her left arm had been removed.  The left arm did seem slightly swollen but nontender.  Tissues were soft.  Lungs were clear.  SPO2 100% on room air.  Hemoglobin stable at 9.1.        I told her that she should continue her Coumadin as outlined by Dr. Kenton Kingfisher and make an appointment to see Dr. Kenton Kingfisher within for 5 days.                I told her that considering her prior history of pulmonary embolism 14 years ago that she should discuss her coagulation status with Dr. Sonny Dandy to make sure that she does not have a hypercoagulable state.  She already has an appointment to see him to discuss management of her breast cancer.          I told her that her left arm swelling should slowly improve with the Coumadin therapy          Diet and activities were discussed.  Hydration was encouraged  Addendum:       Discharge was canceled when repeat hemoglobin of 6.1 was noted.    She was transfused 2 units of PRBC.  She remained normotensive.  Tachycardia resolved.  Follow-up hemoglobin was 8.0.  Repeat was 7.8.  On date of discharge INR was 1.84 and prothrombin time 21.1.     Examination of the breast on June 15, 2018 was stable.  Some ecchymoses but quite soft and no larger and no sign of any continued bleeding.  She was comfortable and had minimal pain.  She wanted to go home.  Left arm swelling was somewhat improved.       She was told to continue her Coumadin as outlined by Dr. Kenton Kingfisher and make an appointment to see  Dr. Kenton Kingfisher within 5 days       She was told to make an appointment to see Dr. Dalbert Batman in 1 week       She will keep her regular appointment with Dr. Payton Mccallum       We called in a prescription for ferrous sulfate 325 mill grams twice daily and a prescription for Colace 1 tablet twice a day to counteract constipation   Consults: Pharmacy.  Coumadin dosing  Significant Diagnostic Studies: Blood test  Treatments: surgery: Evacuation right breast hematoma  Disposition: Home  Patient Instructions:  Allergies as of 06/14/2018   No Known Allergies     Medication List    TAKE these medications   acetaminophen 500 MG tablet Commonly known as:  TYLENOL Take 500 mg by mouth every 6 (six) hours as needed for mild pain.   anastrozole 1 MG tablet Commonly known as:  ARIMIDEX Take 1 tablet (1 mg total) by mouth daily.   CALCIUM PO Take 1 tablet by mouth daily.   cholecalciferol 1000 units tablet Commonly known as:  VITAMIN D Take 1,000 Units by mouth daily.   enoxaparin 120 MG/0.8ML injection Commonly known as:  LOVENOX Inject 120 mg into the skin every 12 (twelve) hours.   HYDROcodone-acetaminophen 5-325 MG tablet Commonly known  as:  NORCO/VICODIN Take 1-2 tablets by mouth every 6 (six) hours as needed for moderate pain or severe pain.   MAGNESIUM PO Take 1 tablet by mouth daily.   warfarin 5 MG tablet Commonly known as:  COUMADIN Take 1 tablet (5 mg total) by mouth daily. Except Tue and Thu What changed:    when to take this  additional instructions   warfarin 7.5 MG tablet Commonly known as:  COUMADIN Take 1 tablet (7.5 mg total) by mouth daily. Tue and Thu What changed:    how much to take  when to take this  additional instructions            Discharge Care Instructions  (From admission, onward)         Start     Ordered   06/14/18 0000  Discharge wound care:    Comments:  Breast binder and ice pack should continue as described above  You may shower    06/14/18 0621          Activity: Stay well-hydrated.  Lots of ambulation.  No sports lifting or driving. Diet: low fat, low cholesterol diet Wound Care: ice to area for comfort  Follow-up:  With Dr. Dalbert Batman in 1 week.  Signed: Edsel Petrin. Dalbert Batman, M.D., FACS General and minimally invasive surgery Breast and Colorectal Surgery  06/14/2018, 6:22 AM

## 2018-06-15 ENCOUNTER — Inpatient Hospital Stay: Payer: Medicare Other | Attending: Hematology and Oncology | Admitting: Hematology and Oncology

## 2018-06-15 LAB — TYPE AND SCREEN
ABO/RH(D): A POS
Antibody Screen: NEGATIVE
Unit division: 0
Unit division: 0

## 2018-06-15 LAB — CBC
HCT: 24.2 % — ABNORMAL LOW (ref 36.0–46.0)
Hemoglobin: 7.8 g/dL — ABNORMAL LOW (ref 12.0–15.0)
MCH: 27.3 pg (ref 26.0–34.0)
MCHC: 32.2 g/dL (ref 30.0–36.0)
MCV: 84.6 fL (ref 78.0–100.0)
PLATELETS: 165 10*3/uL (ref 150–400)
RBC: 2.86 MIL/uL — ABNORMAL LOW (ref 3.87–5.11)
RDW: 14.8 % (ref 11.5–15.5)
WBC: 9.2 10*3/uL (ref 4.0–10.5)

## 2018-06-15 LAB — BPAM RBC
Blood Product Expiration Date: 201908242359
Blood Product Expiration Date: 201908262359
ISSUE DATE / TIME: 201908191057
ISSUE DATE / TIME: 201908191519
UNIT TYPE AND RH: 6200
Unit Type and Rh: 6200

## 2018-06-15 LAB — PROTIME-INR
INR: 1.84
PROTHROMBIN TIME: 21.1 s — AB (ref 11.4–15.2)

## 2018-06-15 MED ORDER — WARFARIN SODIUM 7.5 MG PO TABS
7.5000 mg | ORAL_TABLET | Freq: Once | ORAL | Status: DC
  Filled 2018-06-15: qty 1

## 2018-06-15 MED ORDER — DOCUSATE SODIUM 100 MG PO CAPS
100.0000 mg | ORAL_CAPSULE | Freq: Two times a day (BID) | ORAL | Status: DC
  Administered 2018-06-15: 100 mg via ORAL
  Filled 2018-06-15: qty 1

## 2018-06-15 MED ORDER — FERROUS SULFATE 325 (65 FE) MG PO TABS: 325.0000 mg | ORAL_TABLET | Freq: Two times a day (BID) | ORAL | 3 refills | Status: AC

## 2018-06-15 MED ORDER — DOCUSATE SODIUM 100 MG PO CAPS: 100.0000 mg | ORAL_CAPSULE | Freq: Two times a day (BID) | ORAL | 2 refills | Status: DC

## 2018-06-15 MED ORDER — FERROUS SULFATE 325 (65 FE) MG PO TABS
325.0000 mg | ORAL_TABLET | Freq: Two times a day (BID) | ORAL | Status: DC
  Administered 2018-06-15: 325 mg via ORAL
  Filled 2018-06-15: qty 1

## 2018-06-15 NOTE — Progress Notes (Signed)
Pt is discharged to go home.  Discharge instructions and prescription information given 

## 2018-06-15 NOTE — Progress Notes (Signed)
ANTICOAGULATION CONSULT NOTE - Follow Up Consult  Pharmacy Consult for Coumain Indication: h/o VTE  No Known Allergies  Patient Measurements:   Heparin Dosing Weight:    Vital Signs: Temp: 97.8 F (36.6 C) (08/20 0609) Temp Source: Oral (08/20 0609) BP: 107/71 (08/20 0609) Pulse Rate: 77 (08/20 0609)  Labs: Recent Labs    06/12/18 1726 06/12/18 2213 06/13/18 0126 06/14/18 0602 06/14/18 2130 06/15/18 0639  HGB 9.4*  --  9.1* 6.0* 8.1* 7.8*  HCT 31.5*  --  29.7* 19.9* 25.6* 24.2*  PLT 323  --  304 197  --  165  LABPROT  --  18.3*  --   --   --  21.1*  INR  --  1.53  --   --   --  1.84  CREATININE 0.35*  --   --   --   --   --     Estimated Creatinine Clearance: 75.2 mL/min (A) (by C-G formula based on SCr of 0.35 mg/dL (L)).   Assessment:  Anticoag: On Coumadin PTA for hx of PE. Held for breast biopsy and lumpectomy. S/p evacuation of R breast hematoma. Restarted Coumadin 8/18 with INR 1.84. Hgb 9.1>6>7.8 this AM, Plts 734>287>681. Tranfused 8/19. - PTA dose 5mg  daily exc 7.5mg  on Tues    Goal of Therapy:  INR 2-3 Monitor platelets by anticoagulation protocol: Yes   Plan:  Give Coumadin 7.5mg  PO x 1 Monitor daily INR, CBC, s/s of bleed  Anhthu Perdew S. Alford Highland, PharmD, BCPS Clinical Staff Pharmacist Pager [phone]  Eilene Ghazi Stillinger 06/15/2018,7:53 AM

## 2018-06-15 NOTE — Consult Note (Signed)
            [facility] CM Primary Care Navigator  06/15/2018  Brittany [name] 1946-10-20 [id number]   Seenpatientat the bedside toidentify possible discharge needs.  Patient reportsthat sheunderwent right breast lymph node biopsy and lumpectomy recently and "developed severe pain/ swelling to right breast and felt like passing out when stood up" that resulted to this admission/ surgery. (Evacuation of right breast hematoma)  Patientendorses Dr.William Kenton Kingfisher with [facility] as herprimary care provider.   Patientshared [facility] and [facility] on [address] to obtain medications withoutdifficulty so far.  Patientstatesmanagingherownmedicationsat homestraight out of the containers.  Patient hasbeen driving prior to admission/ surgerybut has family members that are available who can providetransportation toherdoctors' appointments if neededafter discharge.  [place] husband Brittany [name]) who will serve as her primary caregiver at home. Daughter Brittany [name]) will be able to assist her at home if needed.  Anticipated plan for dischargeis homeper patient.  Patientvoiced understandingto callprimarycareprovider's office when she returnshome,for a post discharge follow-upvisitwithin1- 2 weeksor sooner if needs arise.Patient letter (with PCP's contact number) was provided asareminder.  Discussed with patientregarding [facility] CM services available for health managementandresourcesat homebut she denies any needs or concerns at this time. Patient states being capable of managing her health issues so far. She voicedunderstandingof needto seek referral from primary care provider to [facility] care management ifdeemed necessary and appropriatefor any services in thefuture.  [facility] care management information was provided for futureneeds that she may have.  Patienthowever,verbally agreedand  optedforEMMIcalls tofollow-up withherrecoveryat home.   Referral made for [facility] General calls after discharge.   For additional questions please contact:  Edwena Felty A. Xayden Linsey, BSN, RN-BC [facility] PRIMARY CARE Navigator Cell: [phone]

## 2018-06-15 NOTE — Progress Notes (Signed)
2 Days Post-Op  Subjective: She states that she feels fine.  No increase in pain or breast swelling.  She is begging to go home. She has not ambulated other than just to the bathroom Last night blood work showed hemoglobin rose from 6.0-8.1 which is an appropriate rise following 2 units PRBC.  Morning blood work has not been drawn  Vital signs are stable.  Heart rate has normalized at 77.  BP 107/71.  Objective: Vital signs in last 24 hours: Temp:  [97.8 F (36.6 C)-99.7 F (37.6 C)] 97.8 F (36.6 C) (08/20 0609) Pulse Rate:  [77-405] 77 (08/20 0609) Resp:  [15-18] 16 (08/20 0609) BP: (72-134)/(39-71) 107/71 (08/20 0609) SpO2:  [97 %-100 %] 100 % (08/20 0609) Last BM Date: 06/11/18  Intake/Output from previous day: 08/19 0701 - 08/20 0700 In: 2974.6 [P.O.:530; I.V.:1034.6; Blood:1410] Out: -  Intake/Output this shift: Total I/O In: 1594.6 [P.O.:120; I.V.:1034.6; Blood:440] Out: -   General appearance: Alert.  Pleasant.  Cooperative.  No distress Resp: clear to auscultation bilaterally Breasts:Right breast appears the same as it did yesterday.  Some ecchymoses but quite soft and no obvious significant hematoma.  She says it is remarkably smaller than it was before the hematoma was evacuated.  Skin healthy.  Clinically no evidence of active bleeding overnight  Lab Results:  Results for orders placed or performed during the hospital encounter of 06/12/18 (from the past 24 hour(s))  Type and screen [facility]     Status: None (Preliminary result)   Collection Time: 06/14/18  7:15 AM  Result Value Ref Range   ABO/RH(D) A POS    Antibody Screen NEG    Sample Expiration 06/17/2018    Unit Number [id number]    Blood Component Type RED CELLS,LR    Unit division 00    Status of Unit ISSUED    Transfusion Status OK TO TRANSFUSE    Crossmatch Result Compatible    Unit Number [id number]    Blood Component Type RED CELLS,LR    Unit division 00    Status of  Unit ISSUED    Transfusion Status OK TO TRANSFUSE    Crossmatch Result      Compatible Performed at [facility], [address], [place], [place] [address]   Prepare RBC     Status: None   Collection Time: 06/14/18  7:15 AM  Result Value Ref Range   Order Confirmation      ORDER PROCESSED BY BLOOD BANK Performed at [facility], [place] [address], [place], [place] [address]   ABO/Rh     Status: None   Collection Time: 06/14/18  7:15 AM  Result Value Ref Range   ABO/RH(D)      A POS Performed at [place] [address] [place],  [address]   Hemoglobin and hematocrit, blood     Status: Abnormal   Collection Time: 06/14/18  9:30 PM  Result Value Ref Range   Hemoglobin 8.1 (L) 12.0 - 15.0 g/dL   HCT 25.6 (L) 36.0 - 46.0 %     Studies/Results: No results found.  . Warfarin - Pharmacist Dosing Inpatient   Does not apply q1800     Assessment/Plan: s/p Procedure(s): EVACUATION OF RIGHT BREAST HEMATOMA  POD #2.  Evacuation of liquefied right breast hematoma. Stable No evidence of ongoing bleeding  Acute blood loss anemia superimposed on chronic anemia. Check hemoglobin this morning and if stable possible discharge home on iron  History DVT and pulmonary embolism x2.  Back on Coumadin per pharmacy. Followed by Dr. [name] as outpatient  Left brachial vein DVT.  Arm swelling a little better after IV discontinued Continue Coumadin  Primary cancer upper outer quadrant right breast History neoadjuvant letrozole antiestrogen therapy  @PROBHOSP @  LOS: 1 day    Brittany [name] 06/15/2018  . .prob

## 2018-06-16 ENCOUNTER — Encounter: Payer: Self-pay | Admitting: *Deleted

## 2018-06-16 NOTE — Progress Notes (Signed)
Malignant neoplasm of upper-outer quadrant of right breast in female, estrogen receptor positive   Location of Breast Cancer: Rt Breast  Histology per Pathology Report: FINAL DIAGNOSIS Diagnosis Breast, right, needle core biopsy, UOQ - FLAT EPITHELIAL ATYPIA. - SEE COMMENT. Microscopic Comment The results were called to [facility] on 07/15/2017.   Receptor Status: ER(100%), PR (15%), Her2-neu ( NEG), Ki-( 67of 5 %)T3 N0 stage IIA AJCC 8 clinical stage   Did patient present with symptoms (if so, please note symptoms) or was this found on screening mammography?:   She had initial mammograms that showed an asymmetry in the right breast upper outer quadrant  Past/Anticipated interventions by surgeon, if any:  FINAL DIAGNOSIS Diagnosis 06-08-18 Dr. [name] 1. Breast, lumpectomy, Right - INVASIVE DUCTAL CARCINOMA, GRADE I/III, SPANNING 0.3 CM. - DUCTAL CARCINOMA IN SITU, INTERMEDIATE GRADE. - FLAT EPITHELIAL ATYPIA. - THE SURGICAL RESECTION MARGINS ARE NEGATIVE FOR CARCINOMA. - SEE ONCOLOGY TABLE BELOW. 2. Breast, excision, Right medial margin - FIBROCYSTIC CHANGES. - THERE IS NO EVIDENCE OF MALIGNANCY. - SEE COMMENT. 3. Breast, excision, Right lateral margin - BENIGN BREAST PARENCHYMA. - THERE IS NO EVIDENCE OF MALIGNANCY. - SEE COMMENT. 4. Lymph node, sentinel, biopsy, Right Axillary #1 - THERE IS NO EVIDENCE OF CARCINOMA IN 1 OF 1 LYMPH NODE (0/1). 5. Lymph node, sentinel, biopsy, Right Axillary #2 - THERE IS NO EVIDENCE OF CARCINOMA IN 1 OF 1 LYMPH NODE (0/1). 6. Lymph node, sentinel, biopsy, Right Axillary #3 - THERE IS NO EVIDENCE OF CARCINOMA IN 1 OF 1 LYMPH NODE (0/1). 7. Lymph node, sentinel, biopsy, Right axillary #4 - THERE IS NO EVIDENCE OF CARCINOMA IN 1 OF 1 LYMPH NODE (0/1). - AXILLARY #4 8. Lymph node, sentinel, biopsy, Right axillary #5 - THERE IS NO EVIDENCE OF CARCINOMA IN 1 OF 1 LYMPH NODE (0/1). 9. Lymph node, sentinel,  biopsy, Right axillary #6 - THERE IS NO EVIDENCE OF CARCINOMA IN 1 OF 1 LYMPH NODE (0/1). Microscopic Comment 1. INVASIVE CARCINOMA OF THE BREAST: Resection 1 of 4 FINAL for TILLY, PERNICE ([id number]) Microscopic Comment(continued) Procedure: Lumpectomy Specimen Laterality: Right Receptor Status: ER(100%), PR (15%), Her2-neu ( - 1.52 ratio), Ki-( 67of 5 %)   ( Dr. [name])  After lengthy discussion, I also discussed with her that if she sees radiation oncology and decides that she does not want to undergo radiation then she will need mastectomy. In that situation there is no need to continue with neoadjuvant treatment  Past/Anticipated interventions by medical oncology, if any: Chemotherapy   Recommendations:(Dr. [name]) 1. Oncotype DX testing to determine if chemotherapy would be of any benefit followed by 2. neoadjuvant hormonal therapy versus chemotherapy 3. Breast conserving surgery if possible followed by 4. Adjuvant radiation therapy followed by 5. Adjuvant antiestrogen therapy  After lengthy discussion, I also discussed with her that if she sees radiation oncology and decides that she does not want to undergo radiation then she will need mastectomy. In that situation there is no need to continue with neoadjuvant treatment  Lymphedema issues, if any:   ROM to right arm Skin to right breast Some swelling ,Patient was in hospital had fluid removed from breast Pain issues, if any:   some sharp shooting pain  SAFETY ISSUES:  Prior radiation?  No  Pacemaker/ICD?  No  Possible current pregnancy? No  Is the patient on methotrexate?  No    G2  Menopause 50's Current Complaints / other details: None   Her  father and brother were diagnosed with prostate cancer

## 2018-06-17 ENCOUNTER — Other Ambulatory Visit: Payer: Self-pay

## 2018-06-17 LAB — CULTURE, BLOOD (ROUTINE X 2): CULTURE: NO GROWTH

## 2018-06-17 NOTE — Patient Outreach (Signed)
[name]) Care Management  06/17/2018  Brittany [name] Nov 27, 1945 [id number]   EMMI- General Discharge RED ON EMMI ALERT Day # 1 Date:06/16/18 Red Alert Reason: Scheduled follow up? No   Outreach attempt: spoke with patient she is able to verify HIPAA.  Patient states she is doing ok since being at home.  Addressed red alert with patient.  She states that she does have a follow up appointment.  She states that she did not realize it was already made for her.  She states she sees the surgeon on tomorrow and her PCP on tomorrow.  Patient previously introduced to services in the hospital.  Discussed services again.  Patient declined services presently.   Plan: RN CM will close case.    Jone Baseman, RN, MSN [facility] Care Management Care Management Coordinator Direct Line [phone] Toll Free: [phone]  Fax: [phone]

## 2018-06-18 LAB — CULTURE, BLOOD (ROUTINE X 2): CULTURE: NO GROWTH

## 2018-06-21 DIAGNOSIS — C50411 Malignant neoplasm of upper-outer quadrant of right female breast: Secondary | ICD-10-CM | POA: Diagnosis not present

## 2018-06-21 DIAGNOSIS — Z23 Encounter for immunization: Secondary | ICD-10-CM | POA: Diagnosis not present

## 2018-06-21 DIAGNOSIS — Z7901 Long term (current) use of anticoagulants: Secondary | ICD-10-CM | POA: Diagnosis not present

## 2018-06-21 DIAGNOSIS — E7800 Pure hypercholesterolemia, unspecified: Secondary | ICD-10-CM | POA: Diagnosis not present

## 2018-06-21 DIAGNOSIS — Z170 Estrogen receptor positive status [ER+]: Secondary | ICD-10-CM | POA: Diagnosis not present

## 2018-06-21 DIAGNOSIS — I8291 Chronic embolism and thrombosis of unspecified vein: Secondary | ICD-10-CM | POA: Diagnosis not present

## 2018-06-21 DIAGNOSIS — Z0000 Encounter for general adult medical examination without abnormal findings: Secondary | ICD-10-CM | POA: Diagnosis not present

## 2018-06-21 DIAGNOSIS — D508 Other iron deficiency anemias: Secondary | ICD-10-CM | POA: Diagnosis not present

## 2018-06-22 ENCOUNTER — Encounter: Payer: Self-pay | Admitting: Radiation Oncology

## 2018-06-22 ENCOUNTER — Other Ambulatory Visit: Payer: Self-pay

## 2018-06-22 ENCOUNTER — Ambulatory Visit
Admission: RE | Admit: 2018-06-22 | Discharge: 2018-06-22 | Disposition: A | Discharge status: Home / Self Care | Payer: Medicare Other | Source: Ambulatory Visit | Attending: Radiation Oncology | Admitting: Radiation Oncology

## 2018-06-22 ENCOUNTER — Telehealth: Payer: Self-pay | Admitting: Hematology and Oncology

## 2018-06-22 VITALS — BP 137/63 | HR 93 | Temp 98.0°F | Wt 222.8 lb

## 2018-06-22 DIAGNOSIS — C50411 Malignant neoplasm of upper-outer quadrant of right female breast: Secondary | ICD-10-CM

## 2018-06-22 DIAGNOSIS — L7633 Postprocedural seroma of skin and subcutaneous tissue following a dermatologic procedure: Secondary | ICD-10-CM | POA: Diagnosis not present

## 2018-06-22 DIAGNOSIS — I2782 Chronic pulmonary embolism: Secondary | ICD-10-CM | POA: Diagnosis not present

## 2018-06-22 DIAGNOSIS — Z79899 Other long term (current) drug therapy: Secondary | ICD-10-CM | POA: Insufficient documentation

## 2018-06-22 DIAGNOSIS — Z9889 Other specified postprocedural states: Secondary | ICD-10-CM | POA: Diagnosis not present

## 2018-06-22 DIAGNOSIS — Z170 Estrogen receptor positive status [ER+]: Secondary | ICD-10-CM | POA: Diagnosis not present

## 2018-06-22 DIAGNOSIS — Z7901 Long term (current) use of anticoagulants: Secondary | ICD-10-CM | POA: Insufficient documentation

## 2018-06-22 NOTE — Telephone Encounter (Signed)
Scheduled appt per 8/26 sch message- pt is aware of appt date and time  

## 2018-06-22 NOTE — Progress Notes (Signed)
Radiation Oncology         ([phone]) [phone] ________________________________  Name: Brittany [name]        MRN: [id number]  Date of Service: 06/22/2018 DOB: 1946-01-07  QV:[id number], [name], MD  [name], MD     REFERRING PHYSICIAN: [name], MD   DIAGNOSIS: The encounter diagnosis was Primary malignant neoplasm of upper outer quadrant of female breast, right ([place]).   HISTORY OF PRESENT ILLNESS: Brittany [name] is a 72 y.o. female with a history of right breast cancer. The patient noted bloody nipple discharge for several months and proceeded with evaluation of this and the films indicated a developing asymmetry within the outer right breast seen on compression views, and there was no ultrasound correlate. She underwent stereotactic biopsy which was consistent with fibrocystic change on 05/25/17. She underwent MRI on 06/15/17 which revealed climped non mass enhancement consistent with the area of asymmetry seen on mammography in the upper outer quadrant of the right breast, measuring 2.1 x 5.6 x 3.4 cm no other suspicious findings were noted in either breast and her nodes appeared normal sized. She did have an MRI guided byiopsy on 06/19/17 which revealed a grade 2, ER/PR positive, HER2 negative, Ki 67 5%, invasive ductal carcinoma with DCIS of the right breast. She had oncotype Dx and her result was 18, and she does not need chemotherapy. She began neoadjuvant antiestrogen therapy and had a great radiographic response by MRI in February 2019, and went on to proceed with extended antiestrogen therapy prior to a vacation and until surgery which occurred on 06/08/18 with lumpectomy and sentinel node biopsy. Final pathology revealed a 3 mm, grade 1, invasive ductal carcinoma, and intermediate grade DCIS. Her margins were negative and all of her 6 sampled nodes were negative. She comes today to discuss adjuvant radiotherapy.   PREVIOUS RADIATION THERAPY: No   PAST MEDICAL HISTORY:  Past Medical  History:  Diagnosis Date  . Acute deep vein thrombosis (DVT) of brachial vein of left upper extremity ([place]) 06/14/2018  . Chronic back pain   . History of ectopic pregnancy   . History of pulmonary embolism    anticoagulated on coumadin       PAST SURGICAL HISTORY: Past Surgical History:  Procedure Laterality Date  . BREAST BIOPSY Right 05/25/2017   malignant  . BREAST BIOPSY Right 07/14/2017  . BREAST BIOPSY Right 06/22/2017   malignant  . BREAST LUMPECTOMY WITH RADIOACTIVE SEED AND SENTINEL LYMPH NODE BIOPSY Right 06/08/2018   Procedure: RIGHT BREAST LUMPECTOMY WITH DOUBLE BRACKETED RADIOACTIVE SEEDS AND SENTINEL LYMPH NODE BIOPSY ERAS PATHWAY;  Surgeon: [name], MD;  Location: [facility];  Service: General;  Laterality: Right;  TAP BLOCK  . HIP FRACTURE SURGERY    . MINOR BREAST BIOPSY Right 06/13/2018   Procedure: EVACUATION OF RIGHT BREAST HEMATOMA;  Surgeon: [name], MD;  Location: [facility];  Service: General;  Laterality: Right;  . TUBAL LIGATION Bilateral      FAMILY HISTORY:  Family History  Problem Relation Age of Onset  . Prostate cancer Father   . Prostate cancer Brother      SOCIAL HISTORY:  reports that she has never smoked. She has never used smokeless tobacco. She reports that she does not drink alcohol or use drugs. The patient is married and lives in [place]. 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   ALLERGIES: Patient has no known allergies.   MEDICATIONS:  Current Outpatient Medications  Medication Sig Dispense Refill  .  acetaminophen (TYLENOL) 500 MG tablet Take 500 mg by mouth every 6 (six) hours as needed for mild pain.     Marland Kitchen anastrozole (ARIMIDEX) 1 MG tablet Take 1 tablet (1 mg total) by mouth daily. 90 tablet 2  . CALCIUM PO Take 1 tablet by mouth daily.     . cholecalciferol (VITAMIN D) 1000 units tablet Take 1,000 Units by mouth daily.     Marland Kitchen docusate sodium (COLACE) 100 MG capsule Take 1 capsule (100 mg total) by  mouth 2 (two) times daily. 20 capsule 2  . ferrous sulfate 325 (65 FE) MG tablet Take 1 tablet (325 mg total) by mouth 2 (two) times daily with a meal. 60 tablet 3  . MAGNESIUM PO Take 1 tablet by mouth daily.     Marland Kitchen warfarin (COUMADIN) 5 MG tablet Take 1 tablet (5 mg total) by mouth daily. Except Tue and Thu (Patient taking differently: Take 5 mg by mouth See admin instructions. On all days except Tuesday)    . warfarin (COUMADIN) 7.5 MG tablet Take 1 tablet (7.5 mg total) by mouth daily. Tue and Thu (Patient taking differently: Take 5-7.5 mg by mouth See admin instructions. On TUESDAYS)    . enoxaparin (LOVENOX) 120 MG/0.8ML injection Inject 120 mg into the skin every 12 (twelve) hours.    Marland Kitchen HYDROcodone-acetaminophen (NORCO) 5-325 MG tablet Take 1-2 tablets by mouth every 6 (six) hours as needed for moderate pain or severe pain. (Patient not taking: Reported on 06/22/2018) 20 tablet 0   No current facility-administered medications for this encounter.      REVIEW OF SYSTEMS: On review of systems, the patient reports that she is doing well overall. She had about 2 L removed from the breast following surgery about 2 weeks ago, and about 500 cc removed last Friday. She is due to see Dr. Dalbert Batman the week of 07/05/18.  She denies any chest pain, shortness of breath, cough, fevers, chills, night sweats, unintended weight changes. She denies any bowel or bladder disturbances, and denies abdominal pain, nausea or vomiting. She denies any new musculoskeletal or joint aches or pains. A complete review of systems is obtained and is otherwise negative.    PHYSICAL EXAM:  Wt Readings from Last 3 Encounters:  06/22/18 222 lb 12.8 oz (101.1 kg)  06/15/18 219 lb 2.2 oz (99.4 kg)  06/08/18 219 lb (99.3 kg)   Temp Readings from Last 3 Encounters:  06/22/18 98 F (36.7 C) (Oral)  06/15/18 97.8 F (36.6 C) (Oral)  06/08/18 97.6 F (36.4 C)   BP Readings from Last 3 Encounters:  06/22/18 137/63  06/15/18  107/71  06/08/18 (!) 143/70   Pulse Readings from Last 3 Encounters:  06/22/18 93  06/15/18 77  06/08/18 96   Pain Assessment Pain Score: 0-No pain/10  In general this is a well appearing African American female in no acute distress. She is alert and oriented x4 and appropriate throughout the examination. HEENT reveals that the patient is normocephalic, atraumatic. EOMs are intact. PERRLA. Skin is intact without any evidence of gross lesions.  Cardiopulmonary assessment is negative for acute distress and she exhibits normal effort. Breast exam is deferred.    ECOG = 1  0 - Asymptomatic (Fully active, able to carry on all predisease activities without restriction)  1 - Symptomatic but completely ambulatory (Restricted in physically strenuous activity but ambulatory and able to carry out work of a light or sedentary nature. For example, light housework, office work)  2 -  Symptomatic, <50% in bed during the day (Ambulatory and capable of all self care but unable to carry out any work activities. Up and about more than 50% of waking hours)  3 - Symptomatic, >50% in bed, but not bedbound (Capable of only limited self-care, confined to bed or chair 50% or more of waking hours)  4 - Bedbound (Completely disabled. Cannot carry on any self-care. Totally confined to bed or chair)  5 - Death   Eustace Pen MM, Creech RH, Tormey DC, et al. [phone]). "Toxicity and response criteria of the [facility]". [place] Oncol. 5 (6): 649-55    LABORATORY DATA:  Lab Results  Component Value Date   WBC 9.2 06/15/2018   HGB 7.8 (L) 06/15/2018   HCT 24.2 (L) 06/15/2018   MCV 84.6 06/15/2018   PLT 165 06/15/2018   Lab Results  Component Value Date   NA 138 06/12/2018   K 4.4 06/12/2018   CL 105 06/12/2018   CO2 23 06/12/2018   Lab Results  Component Value Date   ALT 146 (H) 06/12/2018   AST 76 (H) 06/12/2018   ALKPHOS 122 06/12/2018   BILITOT 0.6 06/12/2018       RADIOGRAPHY: Nm Sentinel Node Inj-no Rpt (breast)  Result Date: 06/08/2018 Sulfur colloid was injected by the nuclear medicine technologist for melanoma sentinel node.   Mm Breast Surgical Specimen  Result Date: 06/08/2018 CLINICAL DATA:  Evaluate surgical specimen following lumpectomy for RIGHT breast invasive ductal carcinoma and flat epithelial atypia. EXAM: SPECIMEN RADIOGRAPH OF THE RIGHT BREAST COMPARISON:  Previous exam(s). FINDINGS: Status post excision of the RIGHT breast. The CYLINDER clip, dumbbell clip, COIL clip and 2 radioactive seeds are present, completely intact, and were marked for pathology. IMPRESSION: Specimen radiograph of the RIGHT breast. Electronically Signed   By: Margarette Canada M.D.   On: 06/08/2018 09:12   Mm Diag Breast Tomo Uni Left  Result Date: 05/28/2018 CLINICAL DATA:  The patient has known breast cancer on the right which was re-evaluated April 19, 2018. She is to proceed to lumpectomy on the right soon. This study is her annual mammogram on the left. EXAM: DIGITAL DIAGNOSTIC UNILATERAL LEFT MAMMOGRAM WITH CAD AND TOMO COMPARISON:  Previous exam(s). ACR Breast Density Category b: There are scattered areas of fibroglandular density. FINDINGS: No suspicious masses, calcifications, or distortion. Mammographic images were processed with CAD. IMPRESSION: No mammographic evidence of malignancy on the left. RECOMMENDATION: Continued surgical follow up for the known right breast cancer. Annual mammography on the left. I have discussed the findings and recommendations with the patient. Results were also provided in writing at the conclusion of the visit. If applicable, a reminder letter will be sent to the patient regarding the next appointment. BI-RADS CATEGORY  2: Benign. Electronically Signed   By: Dorise Bullion III M.D   On: 05/28/2018 15:28   Mm Rt Radioactive Seed Loc Mammo Guide  Result Date: 06/07/2018 CLINICAL DATA:  Pre lumpectomy and pre surgical excision  localization of grade 2 invasive ductal carcinoma and flat epithelial atypia respectively in the upper-outer quadrant of the right breast. There is a dumbbell-shaped biopsy marker clip at the location of previously biopsied malignancy and a cylinder-shaped biopsy marker clip 1 cm anterior to the location of the previously biopsied flat epithelial atypia. There is also a coil shaped biopsy marker clip more inferiorly outer right breast at the location of a previous benign biopsy, not localized today. EXAM: MAMMOGRAPHIC GUIDED RADIOACTIVE SEED LOCALIZATION OF THE  RIGHT BREAST X 2 COMPARISON:  Previous exam(s). FINDINGS: Patient presents for radioactive seed localization prior to . I met with the patient and we discussed the procedure of seed localization including benefits and alternatives. We discussed the high likelihood of a successful procedure. We discussed the risks of the procedure including infection, bleeding, tissue injury and further surgery. We discussed the low dose of radioactivity involved in the procedure. Informed, written consent was given. The usual time-out protocol was performed immediately prior to the procedure. SITE #1: DUMBBELL SHAPED CLIP MARKING THE GRADE 2 INVASIVE DUCTAL CARCINOMA Using mammographic guidance, sterile technique, 1% lidocaine and an I-125 radioactive seed, the dumbbell-shaped biopsy marker clip in the upper-outer quadrant of the right breast was localized using a lateral approach. The follow-up mammogram images confirm the seed in the expected location and were marked for Dr. Dalbert Batman. Follow-up survey of the patient confirms presence of the radioactive seed. Order number of I-125 seed:  [id number]. Total activity:  0.248 mCi reference Date: 06/04/2018 SITE #2: CYLINDER SHAPED CLIP 1 CM ANTERIOR TO THE FLAT EPITHELIAL ATYPIA Using mammographic guidance, sterile technique, 1% lidocaine and an I-125 radioactive seed, the cylinder-shaped biopsy marker clip in the upper-outer  quadrant of the right breast was localized using a lateral approach. The seed was placed 1 cm posterior to the cylinder-shaped clip. The follow-up mammogram images confirm the seed in the expected location and were marked for Dr. Dalbert Batman. Follow-up survey of the patient confirms presence of the radioactive seed. Order number of I-125 seed:  [id number]. Total activity:  0.248 mCi reference Date: 06/04/2018 The patient tolerated the procedures well and was released from the [facility]. She was given instructions regarding seed removal. IMPRESSION: Radioactive seed localization right breast X 2. No apparent complications. Electronically Signed   By: Claudie Revering M.D.   On: 06/07/2018 15:25   Mm Rt Radio Seed Ea Add Lesion Loc Mammo  Result Date: 06/07/2018 CLINICAL DATA:  Pre lumpectomy and pre surgical excision localization of grade 2 invasive ductal carcinoma and flat epithelial atypia respectively in the upper-outer quadrant of the right breast. There is a dumbbell-shaped biopsy marker clip at the location of previously biopsied malignancy and a cylinder-shaped biopsy marker clip 1 cm anterior to the location of the previously biopsied flat epithelial atypia. There is also a coil shaped biopsy marker clip more inferiorly outer right breast at the location of a previous benign biopsy, not localized today. EXAM: MAMMOGRAPHIC GUIDED RADIOACTIVE SEED LOCALIZATION OF THE RIGHT BREAST X 2 COMPARISON:  Previous exam(s). FINDINGS: Patient presents for radioactive seed localization prior to . I met with the patient and we discussed the procedure of seed localization including benefits and alternatives. We discussed the high likelihood of a successful procedure. We discussed the risks of the procedure including infection, bleeding, tissue injury and further surgery. We discussed the low dose of radioactivity involved in the procedure. Informed, written consent was given. The usual time-out protocol was performed  immediately prior to the procedure. SITE #1: DUMBBELL SHAPED CLIP MARKING THE GRADE 2 INVASIVE DUCTAL CARCINOMA Using mammographic guidance, sterile technique, 1% lidocaine and an I-125 radioactive seed, the dumbbell-shaped biopsy marker clip in the upper-outer quadrant of the right breast was localized using a lateral approach. The follow-up mammogram images confirm the seed in the expected location and were marked for Dr. Dalbert Batman. Follow-up survey of the patient confirms presence of the radioactive seed. Order number of I-125 seed:  [id number]. Total activity:  0.248 mCi reference Date: 06/04/2018 SITE #  2: CYLINDER SHAPED CLIP 1 CM ANTERIOR TO THE FLAT EPITHELIAL ATYPIA Using mammographic guidance, sterile technique, 1% lidocaine and an I-125 radioactive seed, the cylinder-shaped biopsy marker clip in the upper-outer quadrant of the right breast was localized using a lateral approach. The seed was placed 1 cm posterior to the cylinder-shaped clip. The follow-up mammogram images confirm the seed in the expected location and were marked for Dr. Dalbert Batman. Follow-up survey of the patient confirms presence of the radioactive seed. Order number of I-125 seed:  [id number]. Total activity:  0.248 mCi reference Date: 06/04/2018 The patient tolerated the procedures well and was released from the [facility]. She was given instructions regarding seed removal. IMPRESSION: Radioactive seed localization right breast X 2. No apparent complications. Electronically Signed   By: Claudie Revering M.D.   On: 06/07/2018 15:25       IMPRESSION/PLAN: 1. Stage IIA, cT3N0M0 grade 2, ER/PR positive invasive ductal carcinoma with DCIS of the right breast with excellent response to antiestrogen therapy. Dr. Lisbeth Renshaw discusses the final pathology findings and reviews the nature of invasive breast disease. Her tumor size prior to treatment was large and warrants consideration of adjuvant radiotherapy. We discussed the risks, benefits, short, and  long term effects of radiotherapy.  Dr. Lisbeth Renshaw discusses the delivery and logistics of radiotherapy and would offer a course of 4 weeks of treatment. The patient would like to consider her options and speak with her family. She is not ready to proceed given her ongoing seroma issues. I will contact her in about two weeks to see how she'd like to proceed.  In a visit lasting 35 minutes, greater than 50% of the time was spent face to face discussing options of treatment as well as logistics, and coordinating the patient's care.  The above documentation reflects my direct findings during this shared patient visit. Please see the separate note by Dr. Lisbeth Renshaw on this date for the remainder of the patient's plan of care.    Carola Rhine, PAC

## 2018-06-29 ENCOUNTER — Telehealth: Payer: Self-pay | Admitting: Hematology and Oncology

## 2018-06-29 ENCOUNTER — Inpatient Hospital Stay: Payer: Medicare Other | Attending: Hematology and Oncology | Admitting: Hematology and Oncology

## 2018-06-29 DIAGNOSIS — C50411 Malignant neoplasm of upper-outer quadrant of right female breast: Secondary | ICD-10-CM | POA: Diagnosis not present

## 2018-06-29 DIAGNOSIS — Z79811 Long term (current) use of aromatase inhibitors: Secondary | ICD-10-CM | POA: Insufficient documentation

## 2018-06-29 DIAGNOSIS — Z170 Estrogen receptor positive status [ER+]: Secondary | ICD-10-CM | POA: Insufficient documentation

## 2018-06-29 DIAGNOSIS — I8291 Chronic embolism and thrombosis of unspecified vein: Secondary | ICD-10-CM | POA: Diagnosis not present

## 2018-06-29 DIAGNOSIS — D6489 Other specified anemias: Secondary | ICD-10-CM | POA: Insufficient documentation

## 2018-06-29 DIAGNOSIS — Z7901 Long term (current) use of anticoagulants: Secondary | ICD-10-CM | POA: Diagnosis not present

## 2018-06-29 MED ORDER — ANASTROZOLE 1 MG PO TABS: 1.0000 mg | ORAL_TABLET | Freq: Every day | ORAL | 3 refills | Status: DC

## 2018-06-29 NOTE — Telephone Encounter (Signed)
Gave pt avs and calendar  °

## 2018-06-29 NOTE — Assessment & Plan Note (Signed)
Intermittent bloody nipple discharge in the right breast, mammogram showed asymmetry right breast upper outer quadrant ultrasound negative biopsy 05/26/2017 fibrocystic changes ; Breast MRI revealed 5.6 x 3.4 cm non-mass enhancement, MRI guided biopsy revealed: IDC with DCIS, grade 2, ER 100%, PR 15%, Ki-67 5%, T3 N0 stage IIA AJCC 8 clinical stage Right lumpectomy: IDC grade 1, 0.3 cm, intermediate grade DCIS, margins negative, 0/5 lymph nodes negative, T1 a N0 stage Ia   Oncotype DX score18: 11% risk of recurrence  Treatment plan: 1.neoadjuvant hormonal therapy versus chemotherapy 07/03/2017-06/05/2018 2.Breast conserving surgeryif possible8/13/2019 3. Adjuvant radiation therapy followed by 4. Adjuvant antiestrogen therapy -------------------------------------------------------------------------- Current treatment: Radiation therapy followed by letrozole 2.5 mg daily  Pathology counseling: I discussed the final pathology report of the patient provided  a copy of this report. I discussed the margins as well as lymph node surgeries. We also discussed the final staging along with previously performed ER/PR and HER-2/neu testing.   Letrozole toxicities:Does have intermittent hot flashes but not severe enough. Denies any arthralgias or myalgias.  Return to clinic at the end of radiation therapy for follow-up.

## 2018-06-29 NOTE — Progress Notes (Signed)
 Patient Care Team: Harris, William, MD as PCP - General (Family Medicine)  DIAGNOSIS:  Encounter Diagnosis  Name Primary?  . Malignant neoplasm of upper-outer quadrant of right breast in female, estrogen receptor positive (HCC)     SUMMARY OF ONCOLOGIC HISTORY:   Malignant neoplasm of upper-outer quadrant of right breast in female, estrogen receptor positive (HCC)   06/19/2017 Initial Diagnosis    Intermittent bloody nipple discharge in the right breast, mammogram showed asymmetry right breast upper outer quadrant ultrasound negative biopsy 05/26/2017 fibrocystic changes ; breast MRI revealed 5.6 x 3.4 cm non-mass enhancement, MRI guided biopsy revealed: IDC with DCIS, grade 2, ER 100%, PR 15%, Ki-67 5%, T3 N0 stage IIA AJCC 8 clinical stage    07/03/2017 Oncotype testing    Oncotype DX score 18: Risk of recurrence 11%    07/03/2017 -  Anti-estrogen oral therapy    Letrozole 2.5 mg daily neoadjuvant hormone therapy    06/08/2018 Surgery    Right lumpectomy: IDC grade 1, 0.3 cm, intermediate grade DCIS, margins negative, 0/5 lymph nodes negative, T1 a N0 stage Ia    06/12/2018 Surgery    Evacuation of right breast postoperative hematoma most likely due to left brachial vein DVT on anticoagulation    06/23/2018 Cancer Staging    Staging form: Breast, AJCC 8th Edition - Pathologic: No Stage Recommended (ypT1a, pN0, cM0, G1, ER+, PR+, HER2-, Oncotype DX score: 18) - Signed by Causey, Lindsey Cornetto, NP on 06/23/2018     CHIEF COMPLIANT: Follow-up after recent breast surgery  INTERVAL HISTORY: Brittany [name] is a 72-year-old with above-mentioned history of right breast cancer treated with 1 year of neoadjuvant antiestrogen therapy and underwent lumpectomy.  She had a 0.3 cm size grade 1 invasive ductal carcinoma.  Postoperatively she had a hematoma that required evacuation with another surgery.  She still feels there is fluid inside the breast.  She has intermittent breast tenderness.   After much discussion she elected not to undergo radiation therapy.  REVIEW OF SYSTEMS:   Constitutional: Denies fevers, chills or abnormal weight loss Eyes: Denies blurriness of vision Ears, nose, mouth, throat, and face: Denies mucositis or sore throat Respiratory: Denies cough, dyspnea or wheezes Cardiovascular: Denies palpitation, chest discomfort Gastrointestinal:  Denies nausea, heartburn or change in bowel habits Skin: Denies abnormal skin rashes Lymphatics: Denies new lymphadenopathy or easy bruising Neurological:Denies numbness, tingling or new weaknesses Behavioral/Psych: Mood is stable, no new changes  Extremities: No lower extremity edema Breast: Right lumpectomy with fluid buildup All other systems were reviewed with the patient and are negative.  I have reviewed the past medical history, past surgical history, social history and family history with the patient and they are unchanged from previous note.  ALLERGIES:  has No Known Allergies.  MEDICATIONS:  Current Outpatient Medications  Medication Sig Dispense Refill  . acetaminophen (TYLENOL) 500 MG tablet Take 500 mg by mouth every 6 (six) hours as needed for mild pain.     . anastrozole (ARIMIDEX) 1 MG tablet Take 1 tablet (1 mg total) by mouth daily. 90 tablet 2  . CALCIUM PO Take 1 tablet by mouth daily.     . cholecalciferol (VITAMIN D) 1000 units tablet Take 1,000 Units by mouth daily.     . docusate sodium (COLACE) 100 MG capsule Take 1 capsule (100 mg total) by mouth 2 (two) times daily. 20 capsule 2  . enoxaparin (LOVENOX) 120 MG/0.8ML injection Inject 120 mg into the skin every 12 (twelve) hours.    .   ferrous sulfate 325 (65 FE) MG tablet Take 1 tablet (325 mg total) by mouth 2 (two) times daily with a meal. 60 tablet 3  . HYDROcodone-acetaminophen (NORCO) 5-325 MG tablet Take 1-2 tablets by mouth every 6 (six) hours as needed for moderate pain or severe pain. (Patient not taking: Reported on 06/22/2018) 20 tablet  0  . MAGNESIUM PO Take 1 tablet by mouth daily.     . warfarin (COUMADIN) 5 MG tablet Take 1 tablet (5 mg total) by mouth daily. Except Tue and Thu (Patient taking differently: Take 5 mg by mouth See admin instructions. On all days except Tuesday)    . warfarin (COUMADIN) 7.5 MG tablet Take 1 tablet (7.5 mg total) by mouth daily. Tue and Thu (Patient taking differently: Take 5-7.5 mg by mouth See admin instructions. On TUESDAYS)     No current facility-administered medications for this visit.     PHYSICAL EXAMINATION: ECOG PERFORMANCE STATUS: 1 - Symptomatic but completely ambulatory  Vitals:   06/29/18 1409  BP: 130/62  Pulse: 89  Resp: 18  Temp: 98.3 F (36.8 C)  SpO2: 100%   Filed Weights   06/29/18 1409  Weight: 221 lb 6.4 oz (100.4 kg)    GENERAL:alert, no distress and comfortable SKIN: skin color, texture, turgor are normal, no rashes or significant lesions EYES: normal, Conjunctiva are pink and non-injected, sclera clear OROPHARYNX:no exudate, no erythema and lips, buccal mucosa, and tongue normal  NECK: supple, thyroid normal size, non-tender, without nodularity LYMPH:  no palpable lymphadenopathy in the cervical, axillary or inguinal LUNGS: clear to auscultation and percussion with normal breathing effort HEART: regular rate & rhythm and no murmurs and no lower extremity edema ABDOMEN:abdomen soft, non-tender and normal bowel sounds MUSCULOSKELETAL:no cyanosis of digits and no clubbing  NEURO: alert & oriented x 3 with fluent speech, no focal motor/sensory deficits EXTREMITIES: No lower extremity edema    LABORATORY DATA:  I have reviewed the data as listed CMP Latest Ref Rng & Units 06/12/2018 06/04/2018 12/28/2017  Glucose 70 - 99 mg/dL 183(H) 88 99  BUN 8 - 23 mg/dL 10 11 11  Creatinine 0.44 - 1.00 mg/dL 0.35(L) 0.72 0.83  Sodium 135 - 145 mmol/L 138 140 144  Potassium 3.5 - 5.1 mmol/L 4.4 4.6 4.6  Chloride 98 - 111 mmol/L 105 110 110(H)  CO2 22 - 32 mmol/L 23  22 25  Calcium 8.9 - 10.3 mg/dL 9.0 9.0 9.4  Total Protein 6.5 - 8.1 g/dL 6.6 6.8 7.0  Total Bilirubin 0.3 - 1.2 mg/dL 0.6 0.6 0.4  Alkaline Phos 38 - 126 U/L 122 86 84  AST 15 - 41 U/L 76(H) 18 20  ALT 0 - 44 U/L 146(H) 14 24    Lab Results  Component Value Date   WBC 9.2 06/15/2018   HGB 7.8 (L) 06/15/2018   HCT 24.2 (L) 06/15/2018   MCV 84.6 06/15/2018   PLT 165 06/15/2018   NEUTROABS 2.9 06/04/2018    ASSESSMENT & PLAN:  Malignant neoplasm of upper-outer quadrant of right breast in female, estrogen receptor positive (HCC) Intermittent bloody nipple discharge in the right breast, mammogram showed asymmetry right breast upper outer quadrant ultrasound negative biopsy 05/26/2017 fibrocystic changes ; Breast MRI revealed 5.6 x 3.4 cm non-mass enhancement, MRI guided biopsy revealed: IDC with DCIS, grade 2, ER 100%, PR 15%, Ki-67 5%, T3 N0 stage IIA AJCC 8 clinical stage Right lumpectomy: IDC grade 1, 0.3 cm, intermediate grade DCIS, margins negative, 0/5 lymph nodes negative,   T1 a N0 stage Ia   Oncotype DX score18: 11% risk of recurrence  Treatment plan: 1.neoadjuvant hormonal therapy versus chemotherapy 07/03/2017-06/05/2018 2.Breast conserving surgery 06/08/2018 3. Adjuvant radiation therapy (given the grade one 0.3 cm final tumor size, patient decided not to undergo adjuvant radiation) 4. Adjuvant antiestrogen therapy -------------------------------------------------------------------------- Current treatment: Letrozole 2.5 mg daily  Pathology counseling: I discussed the final pathology report of the patient provided  a copy of this report. I discussed the margins as well as lymph node surgeries. We also discussed the final staging along with previously performed ER/PR and HER-2/neu testing.   Letrozole toxicities:Does have intermittent hot flashes but not severe enough. Denies any arthralgias or myalgias.  Anemia: Due to recent surgeries hemoglobin 8.6 currently on  oral iron therapy.  I encouraged her to take twice a day for a month and then daily for 2 more months and then stop it at that time.  I will recheck her hemoglobin and she comes back to see me in 6 months.  Return to clinic in 6 months for follow-up.  No orders of the defined types were placed in this encounter.  The patient has a good understanding of the overall plan. she agrees with it. she will call with any problems that may develop before the next visit here.   Harriette Ohara, MD 06/29/18

## 2018-07-12 ENCOUNTER — Telehealth: Payer: Self-pay | Admitting: Radiation Oncology

## 2018-07-12 NOTE — Telephone Encounter (Signed)
I spoke with the patient and at this time she is not going to proceed with radiotherapy. She also continues to work with Dr. Dalbert Batman regarding her seroma. I encouraged her to call us back if she changes her mind and wants to reconsider her treatment options.

## 2018-07-13 ENCOUNTER — Encounter: Payer: Self-pay | Admitting: *Deleted

## 2018-07-13 ENCOUNTER — Telehealth: Payer: Self-pay | Admitting: Hematology and Oncology

## 2018-07-13 NOTE — Telephone Encounter (Signed)
Mailed pt calendar of upcoming appts per 9/17 sch message

## 2018-07-27 DIAGNOSIS — Z7901 Long term (current) use of anticoagulants: Secondary | ICD-10-CM | POA: Diagnosis not present

## 2018-07-27 DIAGNOSIS — I8291 Chronic embolism and thrombosis of unspecified vein: Secondary | ICD-10-CM | POA: Diagnosis not present

## 2018-08-09 DIAGNOSIS — H2513 Age-related nuclear cataract, bilateral: Secondary | ICD-10-CM | POA: Diagnosis not present

## 2018-08-27 DIAGNOSIS — I8291 Chronic embolism and thrombosis of unspecified vein: Secondary | ICD-10-CM | POA: Diagnosis not present

## 2018-08-27 DIAGNOSIS — Z7901 Long term (current) use of anticoagulants: Secondary | ICD-10-CM | POA: Diagnosis not present

## 2018-09-27 DIAGNOSIS — Z7901 Long term (current) use of anticoagulants: Secondary | ICD-10-CM | POA: Diagnosis not present

## 2018-09-27 DIAGNOSIS — I8291 Chronic embolism and thrombosis of unspecified vein: Secondary | ICD-10-CM | POA: Diagnosis not present

## 2018-10-06 ENCOUNTER — Telehealth: Payer: Self-pay

## 2018-10-06 NOTE — Telephone Encounter (Signed)
Spoke with patient to remind of SCP visit with NP on 10/12/18 at 10 am.  Patient said she will come to appt.

## 2018-10-07 DIAGNOSIS — I8291 Chronic embolism and thrombosis of unspecified vein: Secondary | ICD-10-CM | POA: Diagnosis not present

## 2018-10-07 DIAGNOSIS — Z7901 Long term (current) use of anticoagulants: Secondary | ICD-10-CM | POA: Diagnosis not present

## 2018-10-12 ENCOUNTER — Encounter: Payer: Self-pay | Admitting: Adult Health

## 2018-10-12 ENCOUNTER — Telehealth: Payer: Self-pay | Admitting: Adult Health

## 2018-10-12 ENCOUNTER — Inpatient Hospital Stay: Payer: Medicare Other | Attending: Hematology and Oncology | Admitting: Adult Health

## 2018-10-12 VITALS — BP 124/71 | HR 90 | Temp 98.3°F | Resp 18 | Ht 65.0 in | Wt 220.5 lb

## 2018-10-12 DIAGNOSIS — Z7901 Long term (current) use of anticoagulants: Secondary | ICD-10-CM | POA: Diagnosis not present

## 2018-10-12 DIAGNOSIS — Z86718 Personal history of other venous thrombosis and embolism: Secondary | ICD-10-CM | POA: Diagnosis not present

## 2018-10-12 DIAGNOSIS — R232 Flushing: Secondary | ICD-10-CM | POA: Insufficient documentation

## 2018-10-12 DIAGNOSIS — Z79811 Long term (current) use of aromatase inhibitors: Secondary | ICD-10-CM | POA: Diagnosis not present

## 2018-10-12 DIAGNOSIS — Z86711 Personal history of pulmonary embolism: Secondary | ICD-10-CM | POA: Diagnosis not present

## 2018-10-12 DIAGNOSIS — Z170 Estrogen receptor positive status [ER+]: Secondary | ICD-10-CM | POA: Diagnosis not present

## 2018-10-12 DIAGNOSIS — Z79899 Other long term (current) drug therapy: Secondary | ICD-10-CM | POA: Diagnosis not present

## 2018-10-12 DIAGNOSIS — Z8042 Family history of malignant neoplasm of prostate: Secondary | ICD-10-CM | POA: Diagnosis not present

## 2018-10-12 DIAGNOSIS — R200 Anesthesia of skin: Secondary | ICD-10-CM | POA: Insufficient documentation

## 2018-10-12 DIAGNOSIS — C50411 Malignant neoplasm of upper-outer quadrant of right female breast: Secondary | ICD-10-CM | POA: Insufficient documentation

## 2018-10-12 NOTE — Telephone Encounter (Signed)
Per 12/17 no los °
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
## 2018-11-03 DIAGNOSIS — I8291 Chronic embolism and thrombosis of unspecified vein: Secondary | ICD-10-CM | POA: Diagnosis not present

## 2018-11-03 DIAGNOSIS — Z7901 Long term (current) use of anticoagulants: Secondary | ICD-10-CM | POA: Diagnosis not present

## 2018-11-05 DIAGNOSIS — Z9851 Tubal ligation status: Secondary | ICD-10-CM | POA: Diagnosis not present

## 2018-11-05 DIAGNOSIS — C50411 Malignant neoplasm of upper-outer quadrant of right female breast: Secondary | ICD-10-CM | POA: Diagnosis not present

## 2018-11-05 DIAGNOSIS — Z86711 Personal history of pulmonary embolism: Secondary | ICD-10-CM | POA: Diagnosis not present

## 2018-11-05 DIAGNOSIS — Z7901 Long term (current) use of anticoagulants: Secondary | ICD-10-CM | POA: Diagnosis not present

## 2018-11-08 ENCOUNTER — Other Ambulatory Visit: Payer: Self-pay | Admitting: Adult Health

## 2018-11-08 DIAGNOSIS — Z170 Estrogen receptor positive status [ER+]: Principal | ICD-10-CM

## 2018-11-08 DIAGNOSIS — C50411 Malignant neoplasm of upper-outer quadrant of right female breast: Secondary | ICD-10-CM

## 2018-11-22 ENCOUNTER — Other Ambulatory Visit: Payer: Self-pay | Admitting: General Surgery

## 2018-11-22 DIAGNOSIS — N6310 Unspecified lump in the right breast, unspecified quadrant: Secondary | ICD-10-CM

## 2018-11-23 ENCOUNTER — Other Ambulatory Visit: Payer: Self-pay | Admitting: General Surgery

## 2018-11-23 DIAGNOSIS — N6310 Unspecified lump in the right breast, unspecified quadrant: Secondary | ICD-10-CM

## 2018-12-01 DIAGNOSIS — I2782 Chronic pulmonary embolism: Secondary | ICD-10-CM | POA: Diagnosis not present

## 2018-12-01 DIAGNOSIS — Z7901 Long term (current) use of anticoagulants: Secondary | ICD-10-CM | POA: Diagnosis not present

## 2018-12-10 ENCOUNTER — Ambulatory Visit
Admission: RE | Admit: 2018-12-10 | Discharge: 2018-12-10 | Disposition: A | Discharge status: Home / Self Care | Payer: Medicare Other | Source: Ambulatory Visit | Attending: General Surgery | Admitting: General Surgery

## 2018-12-10 ENCOUNTER — Ambulatory Visit: Admission: RE | Admit: 2018-12-10 | Payer: Medicare Other | Source: Ambulatory Visit

## 2018-12-10 DIAGNOSIS — N6310 Unspecified lump in the right breast, unspecified quadrant: Secondary | ICD-10-CM

## 2018-12-10 DIAGNOSIS — Z853 Personal history of malignant neoplasm of breast: Secondary | ICD-10-CM | POA: Diagnosis not present

## 2018-12-10 DIAGNOSIS — R928 Other abnormal and inconclusive findings on diagnostic imaging of breast: Secondary | ICD-10-CM | POA: Diagnosis not present

## 2018-12-10 HISTORY — DX: Personal history of antineoplastic chemotherapy: Z92.21

## 2018-12-17 DIAGNOSIS — M654 Radial styloid tenosynovitis [de Quervain]: Secondary | ICD-10-CM | POA: Diagnosis not present

## 2018-12-29 DIAGNOSIS — I8291 Chronic embolism and thrombosis of unspecified vein: Secondary | ICD-10-CM | POA: Diagnosis not present

## 2018-12-29 DIAGNOSIS — Z7901 Long term (current) use of anticoagulants: Secondary | ICD-10-CM | POA: Diagnosis not present

## 2018-12-29 NOTE — Progress Notes (Signed)
Patient Care Team: [name], MD as PCP - General (Family Medicine) [name], MD as Consulting Physician (Hematology and Oncology) [name], MD as Consulting Physician (General Surgery) [name], MD as Consulting Physician (Radiation Oncology) [name], NP as Nurse Practitioner (Hematology and Oncology)  DIAGNOSIS:    ICD-10-CM   1. Malignant neoplasm of upper-outer quadrant of right breast in female, estrogen receptor positive ([place]) C50.411    Z17.0     SUMMARY OF ONCOLOGIC HISTORY: Oncology History   8/     Malignant neoplasm of upper-outer quadrant of right breast in female, estrogen receptor positive ([place])   06/19/2017 Initial Diagnosis    Intermittent bloody nipple discharge in the right breast, mammogram showed asymmetry right breast upper outer quadrant ultrasound negative biopsy 05/26/2017 fibrocystic changes ; breast MRI revealed 5.6 x 3.4 cm non-mass enhancement, MRI guided biopsy revealed: IDC with DCIS, grade 2, ER 100%, PR 15%, Ki-67 5%, T3 N0 stage IIA AJCC 8 clinical stage    07/03/2017 Oncotype testing    Oncotype DX score 18: Risk of recurrence 11%    07/03/2017 -  Anti-estrogen oral therapy    Letrozole 2.5 mg daily neoadjuvant hormone therapy    06/08/2018 Surgery    Right lumpectomy: IDC grade 1, 0.3 cm, intermediate grade DCIS, margins negative, 0/5 lymph nodes negative, T1 a N0 stage Ia    06/12/2018 Surgery    Evacuation of right breast postoperative hematoma most likely due to left brachial vein DVT on anticoagulation    06/23/2018 Cancer Staging    Staging form: Breast, AJCC 8th Edition - Pathologic: Stage IA (pT1a, pN0, cM0, G1, ER+, PR+, HER2-, Oncotype DX score: 18) - Signed by [name], NP on 10/12/2018     CHIEF COMPLIANT: Follow-up of letrozole  INTERVAL HISTORY: Brittany [name] is a 73 y.o. with above-mentioned history of right breast cancer treated with 1 year of neoadjuvant antiestrogen  therapy followed by lumpectomy and adjuvant antiestrogen therapy with letrozole. She is currently on oral iron therapy due to low hemoglobin following her breast surgeries and Warfarin for a history of DVTs and PEs. She presents to the clinic alone today and notes fatigue, mild hot flashes, and joint stiffness in the morning that improves when she ambulates. She has tendonitis in her right hand, which she attributes to lifting her grandchild, for which steroid injections have not helped. She does not exercise regularly. She reviewed her medication list with me.   REVIEW OF SYSTEMS:   Constitutional: Denies fevers, chills or abnormal weight loss (+) fatigue (+) mild hot flahes Eyes: Denies blurriness of vision Ears, nose, mouth, throat, and face: Denies mucositis or sore throat Respiratory: Denies cough, dyspnea or wheezes Cardiovascular: Denies palpitation, chest discomfort Gastrointestinal: Denies nausea, heartburn or change in bowel habits Skin: Denies abnormal skin rashes MSK: (+) joint stiffness (+) tendonitis Lymphatics: Denies new lymphadenopathy or easy bruising Neurological: Denies numbness, tingling or new weaknesses Behavioral/Psych: Mood is stable, no new changes  Extremities: No lower extremity edema Breast: denies any pain or lumps or nodules in either breasts All other systems were reviewed with the patient and are negative.  I have reviewed the past medical history, past surgical history, social history and family history with the patient and they are unchanged from previous note.  ALLERGIES:  has No Known Allergies.  MEDICATIONS:  Current Outpatient Medications  Medication Sig Dispense Refill  . acetaminophen (TYLENOL) 500 MG tablet Take 500 mg by mouth every 6 (six) hours  as needed for mild pain.     Marland Kitchen anastrozole (ARIMIDEX) 1 MG tablet Take 1 tablet (1 mg total) by mouth daily. 90 tablet 3  . CALCIUM PO Take 1 tablet by mouth daily.     . cholecalciferol (VITAMIN D) 1000  units tablet Take 1,000 Units by mouth daily.     Marland Kitchen docusate sodium (COLACE) 100 MG capsule Take 1 capsule (100 mg total) by mouth 2 (two) times daily. 20 capsule 2  . ferrous sulfate 325 (65 FE) MG tablet Take 1 tablet (325 mg total) by mouth 2 (two) times daily with a meal. 60 tablet 3  . MAGNESIUM PO Take 1 tablet by mouth daily.     Marland Kitchen warfarin (COUMADIN) 5 MG tablet Take 1 tablet (5 mg total) by mouth daily. Except Tue and Thu (Patient taking differently: Take 5 mg by mouth See admin instructions. On all days except Tuesday, Thursday and Saturday. (On these days 7.5))    . warfarin (COUMADIN) 7.5 MG tablet Take 1 tablet (7.5 mg total) by mouth daily. Tue and Thu (Patient taking differently: Take 5-7.5 mg by mouth See admin instructions. On TUESDAYS)     No current facility-administered medications for this visit.     PHYSICAL EXAMINATION: ECOG PERFORMANCE STATUS: 1 - Symptomatic but completely ambulatory  Vitals:   12/30/18 1142  BP: (!) 142/80  Pulse: 72  Resp: 17  Temp: (!) 97.5 F (36.4 C)  SpO2: 99%   Filed Weights   12/30/18 1142  Weight: 218 lb 1.6 oz (98.9 kg)    GENERAL: alert, no distress and comfortable SKIN: skin color, texture, turgor are normal, no rashes or significant lesions EYES: normal, Conjunctiva are pink and non-injected, sclera clear OROPHARYNX: no exudate, no erythema and lips, buccal mucosa, and tongue normal  NECK: supple, thyroid normal size, non-tender, without nodularity LYMPH: no palpable lymphadenopathy in the cervical, axillary or inguinal LUNGS: clear to auscultation and percussion with normal breathing effort HEART: regular rate & rhythm and no murmurs and no lower extremity edema ABDOMEN: abdomen soft, non-tender and normal bowel sounds MUSCULOSKELETAL: no cyanosis of digits and no clubbing  NEURO: alert & oriented x 3 with fluent speech, no focal motor/sensory deficits EXTREMITIES: No lower extremity edema BREAST: No palpable masses or  nodules in either right or left breasts. No palpable axillary supraclavicular or infraclavicular adenopathy no breast tenderness or nipple discharge. (exam performed in the presence of a chaperone)  LABORATORY DATA:  I have reviewed the data as listed CMP Latest Ref Rng & Units 06/12/2018 06/04/2018 12/28/2017  Glucose 70 - 99 mg/dL 183(H) 88 99  BUN 8 - 23 mg/dL 10 11 11   Creatinine 0.44 - 1.00 mg/dL 0.35(L) 0.72 0.83  Sodium 135 - 145 mmol/L 138 140 144  Potassium 3.5 - 5.1 mmol/L 4.4 4.6 4.6  Chloride 98 - 111 mmol/L 105 110 110(H)  CO2 22 - 32 mmol/L 23 22 25   Calcium 8.9 - 10.3 mg/dL 9.0 9.0 9.4  Total Protein 6.5 - 8.1 g/dL 6.6 6.8 7.0  Total Bilirubin 0.3 - 1.2 mg/dL 0.6 0.6 0.4  Alkaline Phos 38 - 126 U/L 122 86 84  AST 15 - 41 U/L 76(H) 18 20  ALT 0 - 44 U/L 146(H) 14 24    Lab Results  Component Value Date   WBC 8.0 12/30/2018   HGB 11.4 (L) 12/30/2018   HCT 37.6 12/30/2018   MCV 83.0 12/30/2018   PLT 288 12/30/2018   NEUTROABS 4.1 12/30/2018  ASSESSMENT & PLAN:  Malignant neoplasm of upper-outer quadrant of right breast in female, estrogen receptor positive ([facility]) Intermittent bloody nipple discharge in the right breast, mammogram showed asymmetry right breast upper outer quadrant ultrasound negative biopsy 05/26/2017 fibrocystic changes ; Breast MRI revealed 5.6 x 3.4 cm non-mass enhancement, MRI guided biopsy revealed: IDC with DCIS, grade 2, ER 100%, PR 15%, Ki-67 5%, T3 N0 stage IIA AJCC 8 clinical stage Right lumpectomy: IDC grade 1, 0.3 cm, intermediate grade DCIS, margins negative, 0/5 lymph nodes negative, T1 a N0 stage Ia   Oncotype DX score18: 11% risk of recurrence  Treatment plan: 1.neoadjuvant hormonal therapy versus chemotherapy 07/03/2017-06/05/2018 2.Breast conserving surgery 06/08/2018 3. Adjuvant radiation therapy (given the grade one 0.3 cm final tumor size, patient decided not to undergo adjuvant radiation) 4. Adjuvant antiestrogen  therapy -------------------------------------------------------------------------- Current treatment: Letrozole 2.5 mg daily started 06/29/2018 Letrozole toxicities: 1.  Occasional hot flashes 2.  Joint stiffness when she wakes up 3.  Tendinitis in the right thumb probably from lifting her grandchild  Breast cancer surveillance: 1.  Breast exam 12/30/2018: Benign 2.  Mammogram: 12/10/2018: Benign, breast density category B  Return to clinic in 1 year for follow-up    No orders of the defined types were placed in this encounter.  The patient has a good understanding of the overall plan. she agrees with it. she will call with any problems that may develop before the next visit here.  [name], MD 12/30/2018  Julious Oka Dorshimer am acting as scribe for Dr. [name].  I have reviewed the above documentation for accuracy and completeness, and I agree with the above.

## 2018-12-30 ENCOUNTER — Inpatient Hospital Stay (HOSPITAL_BASED_OUTPATIENT_CLINIC_OR_DEPARTMENT_OTHER): Payer: Medicare Other | Admitting: Hematology and Oncology

## 2018-12-30 ENCOUNTER — Inpatient Hospital Stay: Payer: Medicare Other | Attending: Hematology and Oncology

## 2018-12-30 ENCOUNTER — Telehealth: Payer: Self-pay | Admitting: Hematology and Oncology

## 2018-12-30 DIAGNOSIS — Z86718 Personal history of other venous thrombosis and embolism: Secondary | ICD-10-CM | POA: Diagnosis not present

## 2018-12-30 DIAGNOSIS — Z7901 Long term (current) use of anticoagulants: Secondary | ICD-10-CM | POA: Insufficient documentation

## 2018-12-30 DIAGNOSIS — M2560 Stiffness of unspecified joint, not elsewhere classified: Secondary | ICD-10-CM

## 2018-12-30 DIAGNOSIS — Z170 Estrogen receptor positive status [ER+]: Secondary | ICD-10-CM | POA: Diagnosis not present

## 2018-12-30 DIAGNOSIS — N951 Menopausal and female climacteric states: Secondary | ICD-10-CM | POA: Diagnosis not present

## 2018-12-30 DIAGNOSIS — Z79811 Long term (current) use of aromatase inhibitors: Secondary | ICD-10-CM | POA: Insufficient documentation

## 2018-12-30 DIAGNOSIS — Z79899 Other long term (current) drug therapy: Secondary | ICD-10-CM | POA: Diagnosis not present

## 2018-12-30 DIAGNOSIS — Z86711 Personal history of pulmonary embolism: Secondary | ICD-10-CM

## 2018-12-30 DIAGNOSIS — R5383 Other fatigue: Secondary | ICD-10-CM | POA: Diagnosis not present

## 2018-12-30 DIAGNOSIS — C50411 Malignant neoplasm of upper-outer quadrant of right female breast: Secondary | ICD-10-CM | POA: Diagnosis not present

## 2018-12-30 DIAGNOSIS — M779 Enthesopathy, unspecified: Secondary | ICD-10-CM | POA: Diagnosis not present

## 2018-12-30 LAB — CBC WITH DIFFERENTIAL (CANCER CENTER ONLY)
ABS IMMATURE GRANULOCYTES: 0.01 10*3/uL (ref 0.00–0.07)
Basophils Absolute: 0 10*3/uL (ref 0.0–0.1)
Basophils Relative: 1 %
Eosinophils Absolute: 0.3 10*3/uL (ref 0.0–0.5)
Eosinophils Relative: 4 %
HEMATOCRIT: 37.6 % (ref 36.0–46.0)
HEMOGLOBIN: 11.4 g/dL — AB (ref 12.0–15.0)
Immature Granulocytes: 0 %
LYMPHS ABS: 2.9 10*3/uL (ref 0.7–4.0)
Lymphocytes Relative: 37 %
MCH: 25.2 pg — ABNORMAL LOW (ref 26.0–34.0)
MCHC: 30.3 g/dL (ref 30.0–36.0)
MCV: 83 fL (ref 80.0–100.0)
MONOS PCT: 8 %
Monocytes Absolute: 0.6 10*3/uL (ref 0.1–1.0)
NEUTROS ABS: 4.1 10*3/uL (ref 1.7–7.7)
NEUTROS PCT: 50 %
Platelet Count: 288 10*3/uL (ref 150–400)
RBC: 4.53 MIL/uL (ref 3.87–5.11)
RDW: 14.9 % (ref 11.5–15.5)
WBC Count: 8 10*3/uL (ref 4.0–10.5)
nRBC: 0 % (ref 0.0–0.2)

## 2018-12-30 MED ORDER — ANASTROZOLE 1 MG PO TABS: 1.0000 mg | ORAL_TABLET | Freq: Every day | ORAL | 3 refills | Status: DC

## 2018-12-30 NOTE — Assessment & Plan Note (Addendum)
Intermittent bloody nipple discharge in the right breast, mammogram showed asymmetry right breast upper outer quadrant ultrasound negative biopsy 05/26/2017 fibrocystic changes ; Breast MRI revealed 5.6 x 3.4 cm non-mass enhancement, MRI guided biopsy revealed: IDC with DCIS, grade 2, ER 100%, PR 15%, Ki-67 5%, T3 N0 stage IIA AJCC 8 clinical stage Right lumpectomy: IDC grade 1, 0.3 cm, intermediate grade DCIS, margins negative, 0/5 lymph nodes negative, T1 a N0 stage Ia   Oncotype DX score18: 11% risk of recurrence  Treatment plan: 1.neoadjuvant hormonal therapy versus chemotherapy 07/03/2017-06/05/2018 2.Breast conserving surgery 06/08/2018 3. Adjuvant radiation therapy (given the grade one 0.3 cm final tumor size, patient decided not to undergo adjuvant radiation) 4. Adjuvant antiestrogen therapy -------------------------------------------------------------------------- Current treatment: Letrozole 2.5 mg daily started 06/29/2018 Letrozole toxicities: 1.  Occasional hot flashes 2.  Joint stiffness when she wakes up 3.  Tendinitis in the right thumb probably from lifting her grandchild  Breast cancer surveillance: 1.  Breast exam 12/30/2018: Benign 2.  Mammogram: 12/10/2018: Benign, breast density category B  Return to clinic in 1 year for follow-up

## 2018-12-30 NOTE — Telephone Encounter (Signed)
Patient decline avs and calendar °

## 2019-03-02 DIAGNOSIS — Z7901 Long term (current) use of anticoagulants: Secondary | ICD-10-CM | POA: Diagnosis not present

## 2019-03-02 DIAGNOSIS — I8291 Chronic embolism and thrombosis of unspecified vein: Secondary | ICD-10-CM | POA: Diagnosis not present

## 2019-03-05 IMAGING — MG DIGITAL DIAGNOSTIC UNILATERAL LEFT MAMMOGRAM WITH TOMO AND CAD
4 series · 4 of 12 positions shown · non-contrast
Comparison: Previous exam(s).

CLINICAL DATA: The patient has known breast cancer on the right
which was re-evaluated April 19, 2018. She is to proceed to
lumpectomy on the right soon. This study is her annual mammogram on
the left.

EXAM:
DIGITAL DIAGNOSTIC UNILATERAL LEFT MAMMOGRAM WITH CAD AND TOMO

[L CC synth-2D]
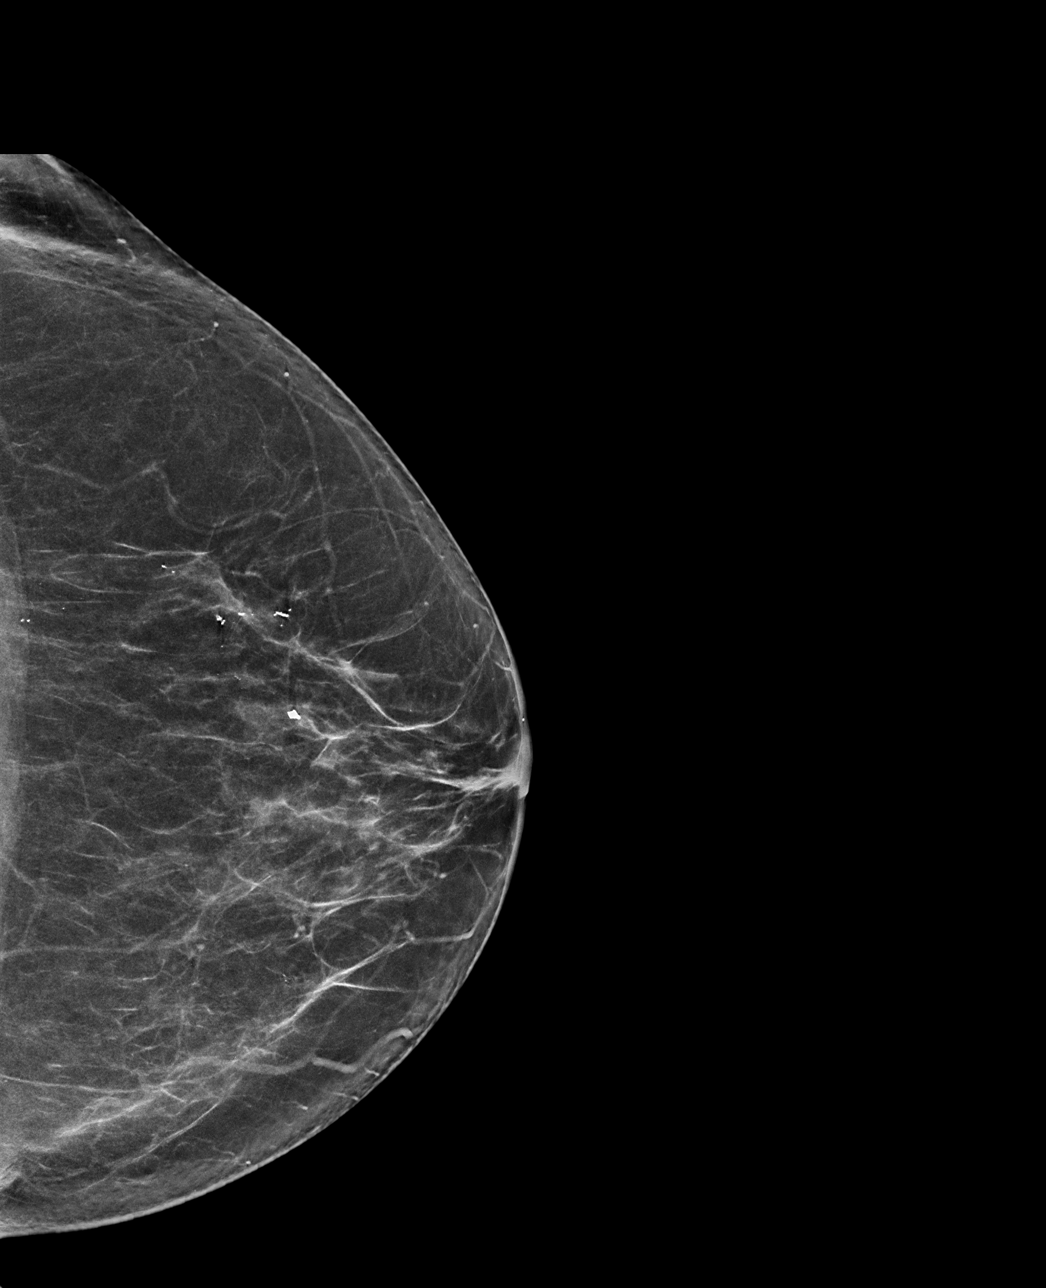

[L MLO synth-2D]
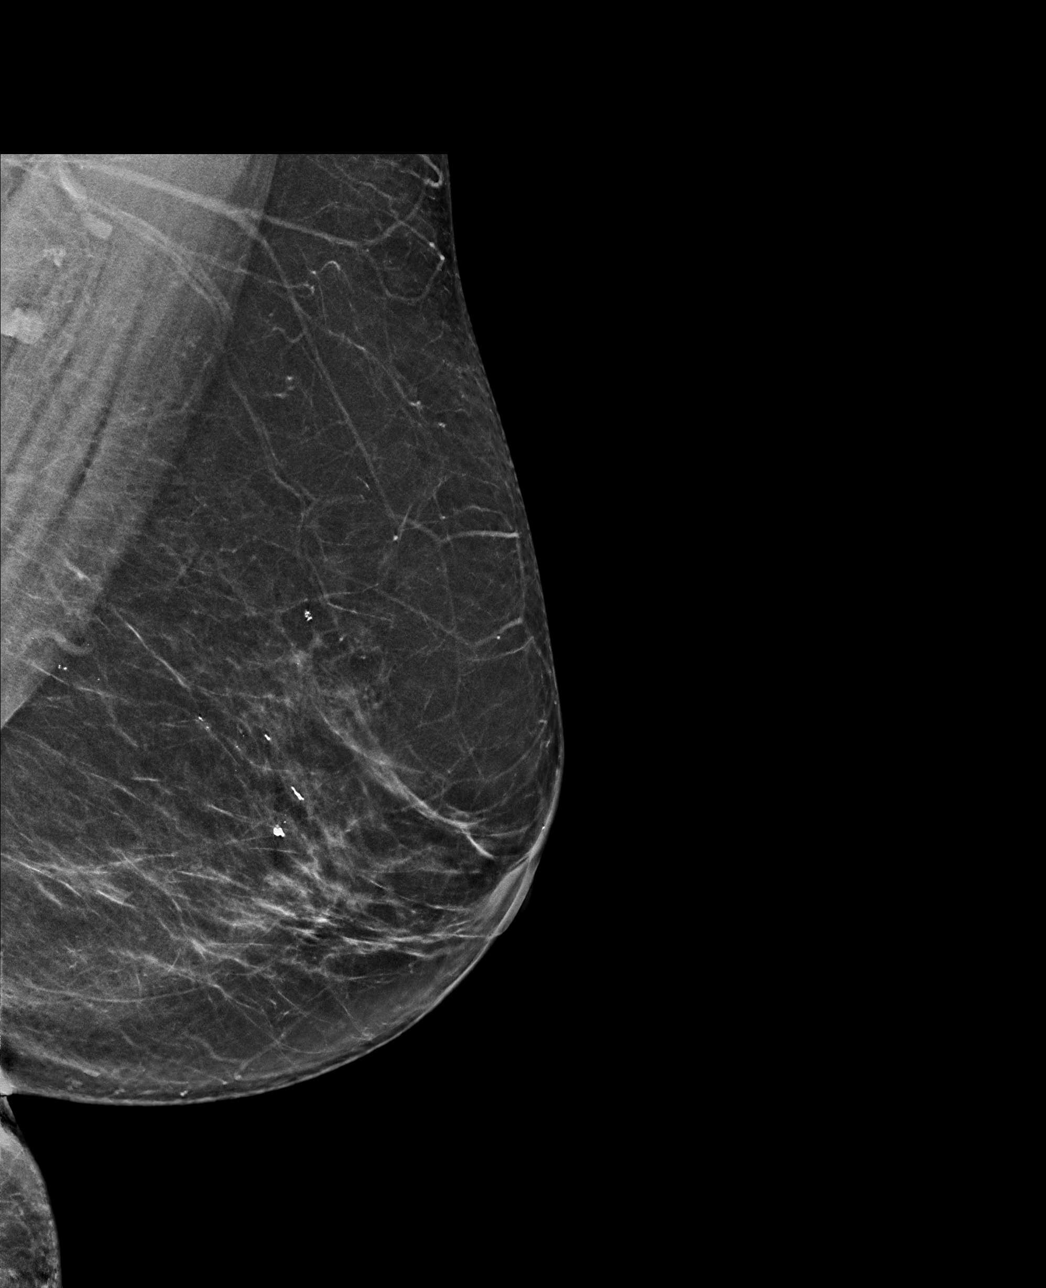

[L MLO tomo · tomo slice 36/71.0]
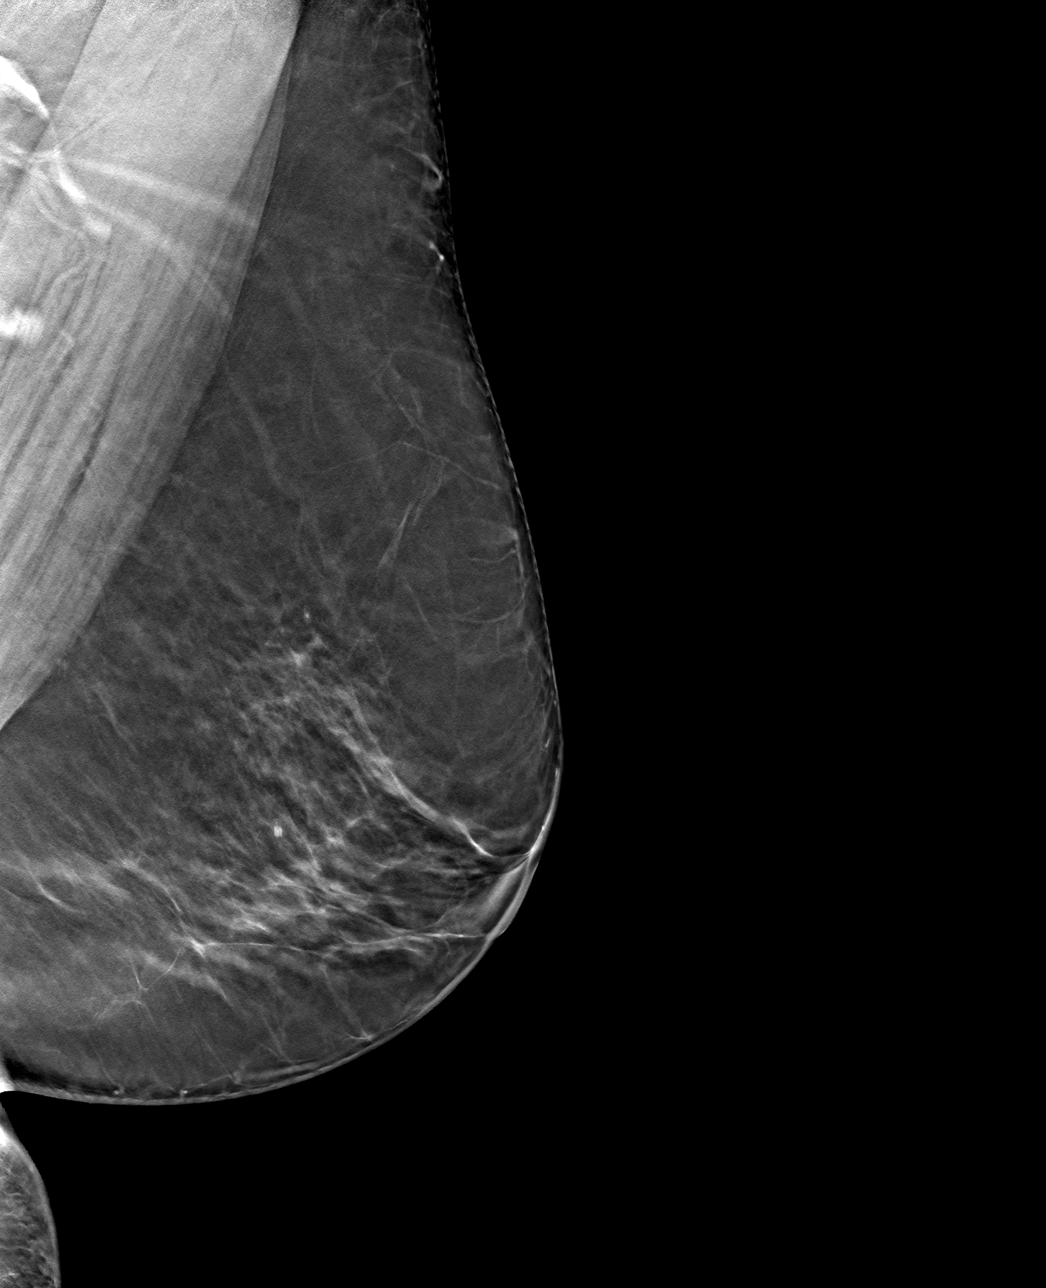

[L CC tomo · tomo slice 35/70.0]
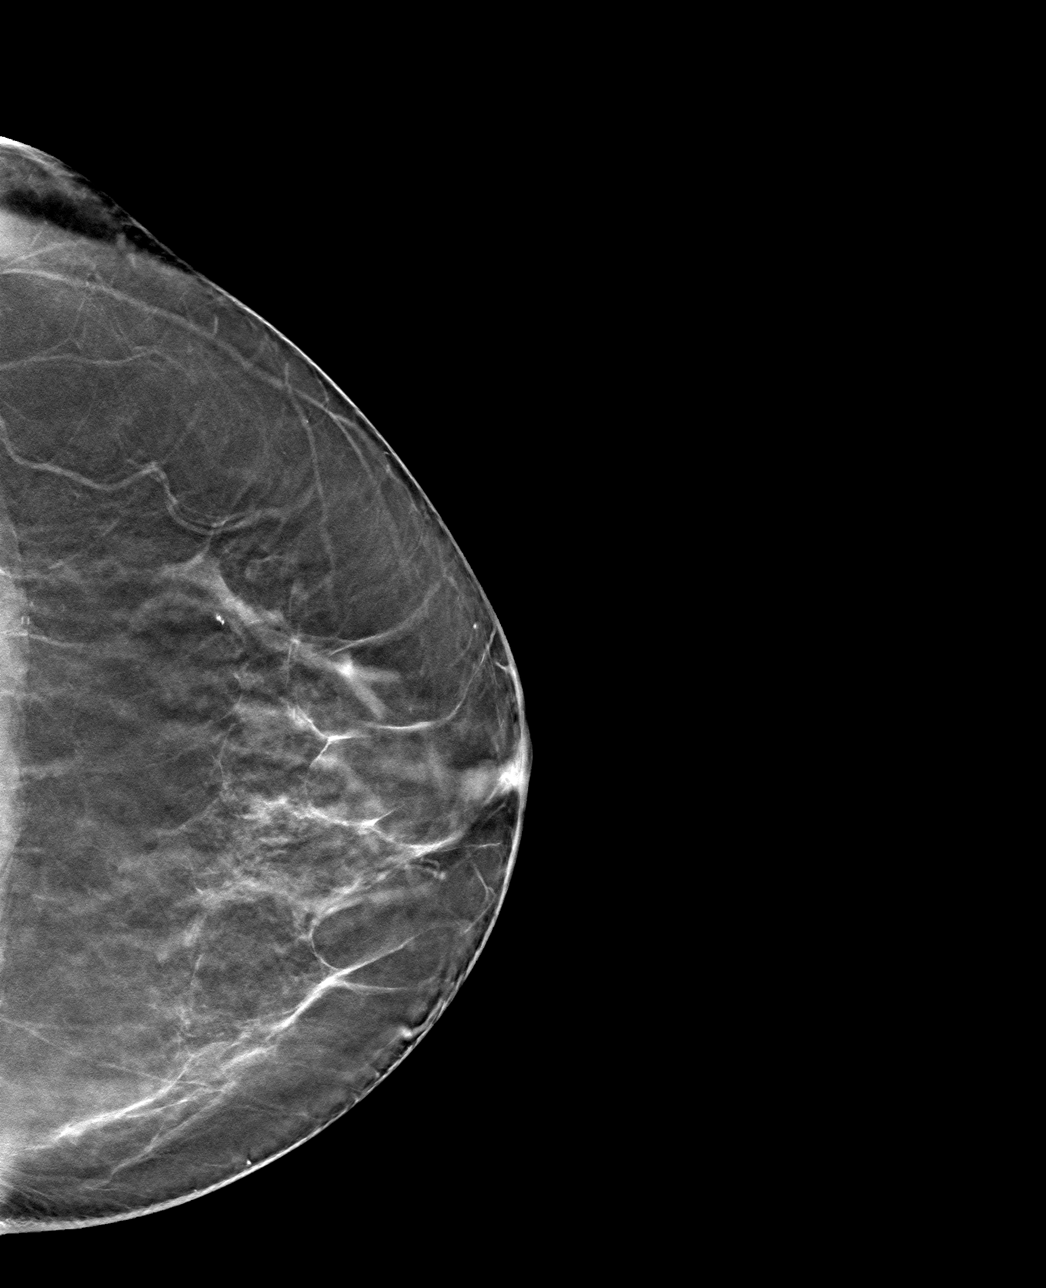

[4 of 12 positions shown; findings below may reference images not displayed]

ACR Breast Density Category b: There are scattered areas of
fibroglandular density.
FINDINGS: No suspicious masses, calcifications, or distortion.

Mammographic images were processed with CAD.
IMPRESSION: No mammographic evidence of malignancy on the left.

RECOMMENDATION:
Continued surgical follow up for the known right breast cancer.
Annual mammography on the left.

I have discussed the findings and recommendations with the patient.
Results were also provided in writing at the conclusion of the
visit. If applicable, a reminder letter will be sent to the patient
regarding the next appointment.

BI-RADS CATEGORY  2: Benign.

## 2019-03-30 DIAGNOSIS — I8291 Chronic embolism and thrombosis of unspecified vein: Secondary | ICD-10-CM | POA: Diagnosis not present

## 2019-03-30 DIAGNOSIS — Z7901 Long term (current) use of anticoagulants: Secondary | ICD-10-CM | POA: Diagnosis not present

## 2019-04-07 DIAGNOSIS — H1032 Unspecified acute conjunctivitis, left eye: Secondary | ICD-10-CM | POA: Diagnosis not present

## 2019-04-27 DIAGNOSIS — I8291 Chronic embolism and thrombosis of unspecified vein: Secondary | ICD-10-CM | POA: Diagnosis not present

## 2019-04-27 DIAGNOSIS — Z7901 Long term (current) use of anticoagulants: Secondary | ICD-10-CM | POA: Diagnosis not present

## 2019-05-25 DIAGNOSIS — Z7901 Long term (current) use of anticoagulants: Secondary | ICD-10-CM | POA: Diagnosis not present

## 2019-05-25 DIAGNOSIS — I8291 Chronic embolism and thrombosis of unspecified vein: Secondary | ICD-10-CM | POA: Diagnosis not present

## 2019-06-04 ENCOUNTER — Emergency Department (HOSPITAL_COMMUNITY): Payer: Medicare Other

## 2019-06-04 ENCOUNTER — Other Ambulatory Visit: Payer: Self-pay

## 2019-06-04 ENCOUNTER — Emergency Department (HOSPITAL_COMMUNITY)
Admission: EM | Admit: 2019-06-04 | Discharge: 2019-06-04 | Disposition: A | Discharge status: Home / Self Care | Payer: Medicare Other | Attending: Emergency Medicine | Admitting: Emergency Medicine

## 2019-06-04 DIAGNOSIS — Z79899 Other long term (current) drug therapy: Secondary | ICD-10-CM | POA: Insufficient documentation

## 2019-06-04 DIAGNOSIS — Z86711 Personal history of pulmonary embolism: Secondary | ICD-10-CM | POA: Diagnosis not present

## 2019-06-04 DIAGNOSIS — Z86718 Personal history of other venous thrombosis and embolism: Secondary | ICD-10-CM | POA: Insufficient documentation

## 2019-06-04 DIAGNOSIS — Z7901 Long term (current) use of anticoagulants: Secondary | ICD-10-CM | POA: Insufficient documentation

## 2019-06-04 DIAGNOSIS — Z853 Personal history of malignant neoplasm of breast: Secondary | ICD-10-CM | POA: Insufficient documentation

## 2019-06-04 DIAGNOSIS — R079 Chest pain, unspecified: Secondary | ICD-10-CM | POA: Diagnosis not present

## 2019-06-04 DIAGNOSIS — R0789 Other chest pain: Secondary | ICD-10-CM | POA: Diagnosis present

## 2019-06-04 DIAGNOSIS — R1013 Epigastric pain: Secondary | ICD-10-CM | POA: Diagnosis not present

## 2019-06-04 LAB — CBC
HCT: 41.1 % (ref 36.0–46.0)
Hemoglobin: 12.6 g/dL (ref 12.0–15.0)
MCH: 25.4 pg — ABNORMAL LOW (ref 26.0–34.0)
MCHC: 30.7 g/dL (ref 30.0–36.0)
MCV: 82.7 fL (ref 80.0–100.0)
Platelets: 371 10*3/uL (ref 150–400)
RBC: 4.97 MIL/uL (ref 3.87–5.11)
RDW: 14 % (ref 11.5–15.5)
WBC: 7.4 10*3/uL (ref 4.0–10.5)
nRBC: 0 % (ref 0.0–0.2)

## 2019-06-04 LAB — HEPATIC FUNCTION PANEL
ALT: 12 U/L (ref 0–44)
AST: 15 U/L (ref 15–41)
Albumin: 3.6 g/dL (ref 3.5–5.0)
Alkaline Phosphatase: 100 U/L (ref 38–126)
Bilirubin, Direct: <0.1 mg/dL (ref 0.0–0.2)
Total Bilirubin: 0.4 mg/dL (ref 0.3–1.2)
Total Protein: 7 g/dL (ref 6.5–8.1)

## 2019-06-04 LAB — BASIC METABOLIC PANEL
Anion gap: 8 (ref 5–15)
BUN: 11 mg/dL (ref 8–23)
CO2: 25 mmol/L (ref 22–32)
Calcium: 9.4 mg/dL (ref 8.9–10.3)
Chloride: 108 mmol/L (ref 98–111)
Creatinine, Ser: 0.77 mg/dL (ref 0.44–1.00)
GFR calc Af Amer: >60 mL/min (ref >60)
GFR calc non Af Amer: >60 mL/min (ref >60)
Glucose, Bld: 96 mg/dL (ref 70–99)
Potassium: 3.9 mmol/L (ref 3.5–5.1)
Sodium: 141 mmol/L (ref 135–145)

## 2019-06-04 LAB — PROTIME-INR
INR: 2.9 — ABNORMAL HIGH (ref 0.8–1.2)
Prothrombin Time: 29.5 seconds — ABNORMAL HIGH (ref 11.4–15.2)

## 2019-06-04 LAB — TROPONIN I (HIGH SENSITIVITY)
Troponin I (High Sensitivity): 5 ng/L (ref <18)
Troponin I (High Sensitivity): 4 ng/L (ref <18)

## 2019-06-04 LAB — D-DIMER, QUANTITATIVE: D-Dimer, Quant: 0.43 ug/mL-FEU (ref 0.00–0.50)

## 2019-06-04 LAB — LIPASE, BLOOD: Lipase: 21 U/L (ref 11–51)

## 2019-06-04 MED ORDER — PANTOPRAZOLE SODIUM 20 MG PO TBEC: 20.0000 mg | DELAYED_RELEASE_TABLET | Freq: Every day | ORAL | 0 refills | Status: DC

## 2019-06-04 MED ORDER — SODIUM CHLORIDE 0.9% FLUSH: 3.0000 mL | Freq: Once | INTRAVENOUS | Status: DC

## 2019-06-04 NOTE — ED Triage Notes (Signed)
Pt reports epigastric pain since Friday, radiating to back. Pt reports hx of PE, states this pain feels similar. Pt reports feeling lightheaded and some shortness of breath on exertion.

## 2019-06-09 NOTE — ED Provider Notes (Signed)
[facility] EMERGENCY DEPARTMENT Provider Note   CSN: [id number] Arrival date & time: 06/04/19  1235     History   Chief Complaint Chief Complaint  Patient presents with   Chest Pain    HPI Brittany [name] is a 73 y.o. female.   HPI   73 year old female with abdominal pain.  Pain is in epigastrium.  Radiates into her back.  Onset Friday.  Persistent since then.  Past history of pulmonary embolism.  She is concerned for the same with her current symptoms.  She is on Coumadin though and reports compliance with her medications.  No unusual leg pain or swelling.  No fevers or chills.  No cough.  Some very mild shortness of breath.  Past Medical History:  Diagnosis Date   Acute deep vein thrombosis (DVT) of brachial vein of left upper extremity ([facility]) 06/14/2018   Chronic back pain    History of ectopic pregnancy    History of pulmonary embolism    anticoagulated on coumadin   Personal history of chemotherapy     Patient Active Problem List   Diagnosis Date Noted   Acute deep vein thrombosis (DVT) of brachial vein of left upper extremity ([place]) 06/14/2018   Breast hematoma 06/12/2018   Malignant neoplasm of upper-outer quadrant of right breast in female, estrogen receptor positive ([place]) 06/25/2017    Past Surgical History:  Procedure Laterality Date   BREAST BIOPSY Right 05/25/2017   malignant   BREAST BIOPSY Right 07/14/2017   BREAST BIOPSY Right 06/22/2017   malignant   BREAST LUMPECTOMY Right 05/2018   BREAST LUMPECTOMY WITH RADIOACTIVE SEED AND SENTINEL LYMPH NODE BIOPSY Right 06/08/2018   Procedure: RIGHT BREAST LUMPECTOMY WITH DOUBLE BRACKETED RADIOACTIVE SEEDS AND SENTINEL LYMPH NODE BIOPSY ERAS PATHWAY;  Surgeon: [name], MD;  Location: [place];  Service: General;  Laterality: Right;  TAP BLOCK   HIP FRACTURE SURGERY     MINOR BREAST BIOPSY Right 06/13/2018   Procedure: EVACUATION OF RIGHT BREAST HEMATOMA;   Surgeon: [name], MD;  Location: [place];  Service: General;  Laterality: Right;   TUBAL LIGATION Bilateral      OB History   No obstetric history on file.      Home Medications    Prior to Admission medications   Medication Sig Start Date End Date Taking? Authorizing Provider  acetaminophen (TYLENOL) 500 MG tablet Take 500 mg by mouth every 6 (six) hours as needed for mild pain.     [provider]  anastrozole (ARIMIDEX) 1 MG tablet Take 1 tablet (1 mg total) by mouth daily. 12/30/18   [name], MD  CALCIUM PO Take 1 tablet by mouth daily.     [provider]  cholecalciferol (VITAMIN D) 1000 units tablet Take 1,000 Units by mouth daily.     [provider]  docusate sodium (COLACE) 100 MG capsule Take 1 capsule (100 mg total) by mouth 2 (two) times daily. 06/15/18   [name], MD  ferrous sulfate 325 (65 FE) MG tablet Take 1 tablet (325 mg total) by mouth 2 (two) times daily with a meal. 06/15/18   [name], MD  MAGNESIUM PO Take 1 tablet by mouth daily.     [provider]  pantoprazole (PROTONIX) 20 MG tablet Take 1 tablet (20 mg total) by mouth daily. 06/04/19   Virgel Manifold, MD  warfarin (COUMADIN) 5 MG tablet Take 1 tablet (5 mg total) by mouth daily. Except Tue and  Thu Patient taking differently: Take 5 mg by mouth See admin instructions. On all days except Tuesday, Thursday and Saturday. (On these days 7.5) 06/25/17   [name], MD  warfarin (COUMADIN) 7.5 MG tablet Take 1 tablet (7.5 mg total) by mouth daily. Tue and Thu Patient taking differently: Take 5-7.5 mg by mouth See admin instructions. On TUESDAYS 06/25/17   [name], MD    Family History Family History  Problem Relation Age of Onset   Prostate cancer Father    Prostate cancer Brother     Social History Social History   Tobacco Use   Smoking status: Never Smoker   Smokeless tobacco: Never Used  Substance Use Topics   Alcohol use: No     Drug use: No     Allergies   Patient has no known allergies.   Review of Systems Review of Systems  All systems reviewed and negative, other than as noted in HPI.  Physical Exam Updated Vital Signs BP (!) 180/90 (BP Location: Right Arm)    Pulse 84    Temp 98.6 F (37 C) (Oral)    Resp 15    SpO2 98%   Physical Exam Vitals signs and nursing note reviewed.  Constitutional:      General: She is not in acute distress.    Appearance: She is well-developed.  HENT:     Head: Normocephalic and atraumatic.  Eyes:     General:        Right eye: No discharge.        Left eye: No discharge.     Conjunctiva/sclera: Conjunctivae normal.  Neck:     Musculoskeletal: Neck supple.  Cardiovascular:     Rate and Rhythm: Normal rate and regular rhythm.     Heart sounds: Normal heart sounds. No murmur. No friction rub. No gallop.   Pulmonary:     Effort: Pulmonary effort is normal. No respiratory distress.     Breath sounds: Normal breath sounds.  Abdominal:     General: There is no distension.     Palpations: Abdomen is soft.     Tenderness: There is abdominal tenderness.     Comments: Mild tenderness in epigastrium without rebound or guarding.  Musculoskeletal:        General: No tenderness.  Skin:    General: Skin is warm and dry.  Neurological:     Mental Status: She is alert.  Psychiatric:        Behavior: Behavior normal.        Thought Content: Thought content normal.      ED Treatments / Results  Labs (all labs ordered are listed, but only abnormal results are displayed) Labs Reviewed  CBC - Abnormal; Notable for the following components:      Result Value   MCH 25.4 (*)    All other components within normal limits  PROTIME-INR - Abnormal; Notable for the following components:   Prothrombin Time 29.5 (*)    INR 2.9 (*)    All other components within normal limits  BASIC METABOLIC PANEL  LIPASE, BLOOD  HEPATIC FUNCTION PANEL  D-DIMER, QUANTITATIVE (NOT AT  [facility])  TROPONIN I (HIGH SENSITIVITY)  TROPONIN I (HIGH SENSITIVITY)    EKG EKG Interpretation  Date/Time:  Saturday June 04 2019 12:39:18 EDT Ventricular Rate:  86 PR Interval:  118 QRS Duration: 78 QT Interval:  358 QTC Calculation: 428 R Axis:   31 Text Interpretation:  Normal sinus rhythm Septal infarct , age undetermined  Abnormal ECG Confirmed by Shanon Rosser [phone]) on 06/05/2019 10:33:56 AM   Radiology No results found.   Dg Chest Portable 1 View  Result Date: 06/04/2019 CLINICAL DATA:  Acute chest pain for 1 day. EXAM: PORTABLE CHEST 1 VIEW COMPARISON:  07/04/2009 CT FINDINGS: The cardiomediastinal silhouette is unremarkable. There is no evidence of focal airspace disease, pulmonary edema, suspicious pulmonary nodule/mass, pleural effusion, or pneumothorax. No acute bony abnormalities are identified. IMPRESSION: No active disease. Electronically Signed   By: Margarette Canada M.D.   On: 06/04/2019 14:34    Procedures Procedures (including critical care time)  Medications Ordered in ED Medications - No data to display   Initial Impression / Assessment and Plan / ED Course  I have reviewed the triage vital signs and the nursing notes.  Pertinent labs & imaging results that were available during my care of the patient were reviewed by me and considered in my medical decision making (see chart for details).        73 year old female with epigastric pain.  I suspect that this is reflux.  Consider ACS, PE, dissection of the emergent process, but doubt.  Work-up is been reassuring.  Will place on Protonix.  Return precautions discussed.  Outpatient follow-up otherwise.  Final Clinical Impressions(s) / ED Diagnoses   Final diagnoses:  Epigastric pain    ED Discharge Orders         Ordered    pantoprazole (PROTONIX) 20 MG tablet  Daily     06/04/19 1544           Virgel Manifold, MD 06/09/19 1016

## 2019-06-22 DIAGNOSIS — Z7901 Long term (current) use of anticoagulants: Secondary | ICD-10-CM | POA: Diagnosis not present

## 2019-06-22 DIAGNOSIS — I2782 Chronic pulmonary embolism: Secondary | ICD-10-CM | POA: Diagnosis not present

## 2019-06-30 DIAGNOSIS — L739 Follicular disorder, unspecified: Secondary | ICD-10-CM | POA: Diagnosis not present

## 2019-07-21 DIAGNOSIS — Z7901 Long term (current) use of anticoagulants: Secondary | ICD-10-CM | POA: Diagnosis not present

## 2019-07-21 DIAGNOSIS — I8291 Chronic embolism and thrombosis of unspecified vein: Secondary | ICD-10-CM | POA: Diagnosis not present

## 2019-08-17 DIAGNOSIS — Z0000 Encounter for general adult medical examination without abnormal findings: Secondary | ICD-10-CM | POA: Diagnosis not present

## 2019-08-17 DIAGNOSIS — C50411 Malignant neoplasm of upper-outer quadrant of right female breast: Secondary | ICD-10-CM | POA: Diagnosis not present

## 2019-08-17 DIAGNOSIS — Z86711 Personal history of pulmonary embolism: Secondary | ICD-10-CM | POA: Diagnosis not present

## 2019-08-17 DIAGNOSIS — Z7901 Long term (current) use of anticoagulants: Secondary | ICD-10-CM | POA: Diagnosis not present

## 2019-08-17 DIAGNOSIS — E7800 Pure hypercholesterolemia, unspecified: Secondary | ICD-10-CM | POA: Diagnosis not present

## 2019-08-22 DIAGNOSIS — I2782 Chronic pulmonary embolism: Secondary | ICD-10-CM | POA: Diagnosis not present

## 2019-08-22 DIAGNOSIS — Z7901 Long term (current) use of anticoagulants: Secondary | ICD-10-CM | POA: Diagnosis not present

## 2019-08-22 DIAGNOSIS — E7800 Pure hypercholesterolemia, unspecified: Secondary | ICD-10-CM | POA: Diagnosis not present

## 2019-08-22 DIAGNOSIS — Z23 Encounter for immunization: Secondary | ICD-10-CM | POA: Diagnosis not present

## 2019-08-29 DIAGNOSIS — I8291 Chronic embolism and thrombosis of unspecified vein: Secondary | ICD-10-CM | POA: Diagnosis not present

## 2019-08-29 DIAGNOSIS — Z7901 Long term (current) use of anticoagulants: Secondary | ICD-10-CM | POA: Diagnosis not present

## 2019-09-05 DIAGNOSIS — Z7901 Long term (current) use of anticoagulants: Secondary | ICD-10-CM | POA: Diagnosis not present

## 2019-09-05 DIAGNOSIS — I2782 Chronic pulmonary embolism: Secondary | ICD-10-CM | POA: Diagnosis not present

## 2019-09-12 DIAGNOSIS — I2782 Chronic pulmonary embolism: Secondary | ICD-10-CM | POA: Diagnosis not present

## 2019-09-12 DIAGNOSIS — Z7901 Long term (current) use of anticoagulants: Secondary | ICD-10-CM | POA: Diagnosis not present

## 2019-10-10 DIAGNOSIS — I2782 Chronic pulmonary embolism: Secondary | ICD-10-CM | POA: Diagnosis not present

## 2019-10-10 DIAGNOSIS — Z7901 Long term (current) use of anticoagulants: Secondary | ICD-10-CM | POA: Diagnosis not present

## 2019-11-07 DIAGNOSIS — Z20822 Contact with and (suspected) exposure to covid-19: Secondary | ICD-10-CM | POA: Diagnosis not present

## 2019-11-07 DIAGNOSIS — R197 Diarrhea, unspecified: Secondary | ICD-10-CM | POA: Diagnosis not present

## 2019-11-07 DIAGNOSIS — I10 Essential (primary) hypertension: Secondary | ICD-10-CM | POA: Diagnosis not present

## 2019-11-16 DIAGNOSIS — I2782 Chronic pulmonary embolism: Secondary | ICD-10-CM | POA: Diagnosis not present

## 2019-11-16 DIAGNOSIS — Z7901 Long term (current) use of anticoagulants: Secondary | ICD-10-CM | POA: Diagnosis not present

## 2019-12-19 DIAGNOSIS — Z7901 Long term (current) use of anticoagulants: Secondary | ICD-10-CM | POA: Diagnosis not present

## 2019-12-19 DIAGNOSIS — I2782 Chronic pulmonary embolism: Secondary | ICD-10-CM | POA: Diagnosis not present

## 2019-12-29 NOTE — Progress Notes (Signed)
Patient Care Team: [name], MD as PCP - General (Family Medicine) [name], MD as Consulting Physician (Hematology and Oncology) [name], MD as Consulting Physician (General Surgery) [name], MD as Consulting Physician (Radiation Oncology) [name], NP as Nurse Practitioner (Hematology and Oncology)  DIAGNOSIS:    ICD-10-CM   1. Malignant neoplasm of upper-outer quadrant of right breast in female, estrogen receptor positive ([place])  C50.411    Z17.0     SUMMARY OF ONCOLOGIC HISTORY: Oncology History Overview Note  8/   Malignant neoplasm of upper-outer quadrant of right breast in female, estrogen receptor positive ([place])  06/19/2017 Initial Diagnosis   Intermittent bloody nipple discharge in the right breast, mammogram showed asymmetry right breast upper outer quadrant ultrasound negative biopsy 05/26/2017 fibrocystic changes ; breast MRI revealed 5.6 x 3.4 cm non-mass enhancement, MRI guided biopsy revealed: IDC with DCIS, grade 2, ER 100%, PR 15%, Ki-67 5%, T3 N0 stage IIA AJCC 8 clinical stage   07/03/2017 Oncotype testing   Oncotype DX score 18: Risk of recurrence 11%   07/03/2017 -  Anti-estrogen oral therapy   Letrozole 2.5 mg daily neoadjuvant hormone therapy   06/08/2018 Surgery   Right lumpectomy: IDC grade 1, 0.3 cm, intermediate grade DCIS, margins negative, 0/5 lymph nodes negative, T1 a N0 stage Ia   06/12/2018 Surgery   Evacuation of right breast postoperative hematoma most likely due to left brachial vein DVT on anticoagulation   06/23/2018 Cancer Staging   Staging form: Breast, AJCC 8th Edition - Pathologic: Stage IA (pT1a, pN0, cM0, G1, ER+, PR+, HER2-, Oncotype DX score: 18) - Signed by [name], NP on 10/12/2018     CHIEF COMPLIANT: Follow-up of right breast cancer on letrozole  INTERVAL HISTORY: Brittany [name] is a 74 y.o. with above-mentioned history of right breast cancer treated with 1 year of  neoadjuvant antiestrogen therapy followed by lumpectomy and adjuvant antiestrogen therapy with letrozole. She is currently on oral iron therapy due to low hemoglobin following her breast surgeries and Warfarin for a history of DVTs and PEs. She presents to the clinic today for annual follow-up.   ALLERGIES:  has No Known Allergies.  MEDICATIONS:  Current Outpatient Medications  Medication Sig Dispense Refill  . acetaminophen (TYLENOL) 500 MG tablet Take 500 mg by mouth every 6 (six) hours as needed for mild pain.     Marland Kitchen anastrozole (ARIMIDEX) 1 MG tablet Take 1 tablet (1 mg total) by mouth daily. 90 tablet 3  . CALCIUM PO Take 1 tablet by mouth daily.     . cholecalciferol (VITAMIN D) 1000 units tablet Take 1,000 Units by mouth daily.     Marland Kitchen docusate sodium (COLACE) 100 MG capsule Take 1 capsule (100 mg total) by mouth 2 (two) times daily. 20 capsule 2  . ferrous sulfate 325 (65 FE) MG tablet Take 1 tablet (325 mg total) by mouth 2 (two) times daily with a meal. 60 tablet 3  . MAGNESIUM PO Take 1 tablet by mouth daily.     . pantoprazole (PROTONIX) 20 MG tablet Take 1 tablet (20 mg total) by mouth daily. 30 tablet 0  . warfarin (COUMADIN) 5 MG tablet Take 1 tablet (5 mg total) by mouth daily. Except Tue and Thu (Patient taking differently: Take 5 mg by mouth See admin instructions. On all days except Tuesday, Thursday and Saturday. (On these days 7.5))    . warfarin (COUMADIN) 7.5 MG tablet Take 1 tablet (7.5 mg total) by  mouth daily. Tue and Thu (Patient taking differently: Take 5-7.5 mg by mouth See admin instructions. On TUESDAYS)     No current facility-administered medications for this visit.    PHYSICAL EXAMINATION: ECOG PERFORMANCE STATUS: 1 - Symptomatic but completely ambulatory  Vitals:   12/30/19 1139  BP: 136/67  Pulse: 86  Resp: 19  Temp: 98.3 F (36.8 C)  SpO2: 100%   Filed Weights   12/30/19 1139  Weight: 217 lb 4.8 oz (98.6 kg)    BREAST: No palpable masses or  nodules in either right or left breasts. No palpable axillary supraclavicular or infraclavicular adenopathy no breast tenderness or nipple discharge. (exam performed in the presence of a chaperone)  LABORATORY DATA:  I have reviewed the data as listed CMP Latest Ref Rng & Units 06/04/2019 06/12/2018 06/04/2018  Glucose 70 - 99 mg/dL 96 183(H) 88  BUN 8 - 23 mg/dL _0 Creatinine 0.44 - 1.00 mg/dL 0.77 0.35(L) 0.72  Sodium 135 - 145 mmol/L 141 138 140  Potassium 3.5 - 5.1 mmol/L 3.9 4.4 4.6  Chloride 98 - 111 mmol/L 108 105 110  CO2 22 - 32 mmol/L _1 Calcium 8.9 - 10.3 mg/dL 9.4 9.0 9.0  Total Protein 6.5 - 8.1 g/dL 7.0 6.6 6.8  Total Bilirubin 0.3 - 1.2 mg/dL 0.4 0.6 0.6  Alkaline Phos 38 - 126 U/L 100 122 86  AST 15 - 41 U/L 15 76(H) 18  ALT 0 - 44 U/L 12 146(H) 14    Lab Results  Component Value Date   WBC 7.4 06/04/2019   HGB 12.6 06/04/2019   HCT 41.1 06/04/2019   MCV 82.7 06/04/2019   PLT 371 06/04/2019   NEUTROABS 4.1 12/30/2018    ASSESSMENT & PLAN:  Malignant neoplasm of upper-outer quadrant of right breast in female, estrogen receptor positive ([facility]) Intermittent bloody nipple discharge in the right breast, mammogram showed asymmetry right breast upper outer quadrant ultrasound negative biopsy 05/26/2017 fibrocystic changes ; Breast MRI revealed 5.6 x 3.4 cm non-mass enhancement, MRI guided biopsy revealed: IDC with DCIS, grade 2, ER 100%, PR 15%, Ki-67 5%, T3 N0 stage IIA AJCC 8 clinical stage Right lumpectomy: IDC grade 1, 0.3 cm, intermediate grade DCIS, margins negative, 0/5 lymph nodes negative, T1 a N0 stage Ia  Oncotype DX score18: 11% risk of recurrence  Treatment plan: 1.neoadjuvant hormonal therapy versus chemotherapy9/04/2017-06/05/2018 2.Breast conserving surgery8/13/2019 3. Adjuvant radiation therapy(given the grade one 0.3 cm final tumor size, patient decided not to undergo adjuvant radiation) 4. Adjuvant antiestrogen  therapy -------------------------------------------------------------------------- Current treatment:Anastrozole 1 mg daily started 06/29/2018 Letrozole toxicities: 1.    Moderate hot flashes 2.  Joint stiffness when she wakes up   Breast cancer surveillance: 1.  Breast exam 12/30/2019: Benign 2.  Mammogram: 12/10/2018: Benign, breast density category B  Return to clinic in 1 year for follow-up    No orders of the defined types were placed in this encounter.  The patient has a good understanding of the overall plan. she agrees with it. she will call with any problems that may develop before the next visit here.  Total time spent: 20 mins including face to face time and time spent for planning, charting and coordination of care  [name], MD 12/30/2019  I, Cloyde Reams Dorshimer, am acting as scribe for Dr. [name].  I have reviewed the above documentation for accuracy and completeness, and I agree with the above.

## 2019-12-30 ENCOUNTER — Ambulatory Visit: Payer: Medicare Other | Attending: Internal Medicine

## 2019-12-30 ENCOUNTER — Other Ambulatory Visit: Payer: Self-pay

## 2019-12-30 ENCOUNTER — Inpatient Hospital Stay: Payer: Medicare Other | Attending: Hematology and Oncology | Admitting: Hematology and Oncology

## 2019-12-30 DIAGNOSIS — Z170 Estrogen receptor positive status [ER+]: Secondary | ICD-10-CM | POA: Diagnosis not present

## 2019-12-30 DIAGNOSIS — Z7901 Long term (current) use of anticoagulants: Secondary | ICD-10-CM | POA: Diagnosis not present

## 2019-12-30 DIAGNOSIS — C50411 Malignant neoplasm of upper-outer quadrant of right female breast: Secondary | ICD-10-CM

## 2019-12-30 DIAGNOSIS — Z923 Personal history of irradiation: Secondary | ICD-10-CM | POA: Insufficient documentation

## 2019-12-30 DIAGNOSIS — Z79811 Long term (current) use of aromatase inhibitors: Secondary | ICD-10-CM | POA: Insufficient documentation

## 2019-12-30 DIAGNOSIS — Z86718 Personal history of other venous thrombosis and embolism: Secondary | ICD-10-CM | POA: Diagnosis not present

## 2019-12-30 DIAGNOSIS — Z23 Encounter for immunization: Secondary | ICD-10-CM | POA: Insufficient documentation

## 2019-12-30 MED ORDER — ANASTROZOLE 1 MG PO TABS: 1.0000 mg | ORAL_TABLET | Freq: Every day | ORAL | 3 refills | Status: DC

## 2019-12-30 NOTE — Assessment & Plan Note (Signed)
Intermittent bloody nipple discharge in the right breast, mammogram showed asymmetry right breast upper outer quadrant ultrasound negative biopsy 05/26/2017 fibrocystic changes ; Breast MRI revealed 5.6 x 3.4 cm non-mass enhancement, MRI guided biopsy revealed: IDC with DCIS, grade 2, ER 100%, PR 15%, Ki-67 5%, T3 N0 stage IIA AJCC 8 clinical stage Right lumpectomy: IDC grade 1, 0.3 cm, intermediate grade DCIS, margins negative, 0/5 lymph nodes negative, T1 a N0 stage Ia  Oncotype DX score18: 11% risk of recurrence  Treatment plan: 1.neoadjuvant hormonal therapy versus chemotherapy9/04/2017-06/05/2018 2.Breast conserving surgery8/13/2019 3. Adjuvant radiation therapy(given the grade one 0.3 cm final tumor size, patient decided not to undergo adjuvant radiation) 4. Adjuvant antiestrogen therapy -------------------------------------------------------------------------- Current treatment:Letrozole 2.5 mg daily started 06/29/2018 Letrozole toxicities: 1.  Occasional hot flashes 2.  Joint stiffness when she wakes up 3.  Tendinitis in the right thumb probably from lifting her grandchild  Breast cancer surveillance: 1.  Breast exam 12/30/2019: Benign 2.  Mammogram: 12/10/2018: Benign, breast density category B  Return to clinic in 1 year for follow-up

## 2019-12-30 NOTE — Progress Notes (Signed)
   Covid-19 Vaccination Clinic  Name:  Brittany [name]    MRN: [id number] DOB: [birth year]  12/30/2019  Ms. [name] was observed post Covid-19 immunization for 15 minutes without incident. She was provided with Vaccine Information Sheet and instruction to access the V-Safe system.   Ms. [name] was instructed to call 911 with any severe reactions post vaccine: Marland Kitchen Difficulty breathing  . Swelling of face and throat  . A fast heartbeat  . A bad rash all over body  . Dizziness and weakness   Immunizations Administered    Name Date Dose VIS Date Route   Pfizer COVID-19 Vaccine 12/30/2019  6:55 PM 0.3 mL 10/07/2019 Intramuscular   Manufacturer: [place]   Lot: [id number]   [place]: [id number]

## 2020-01-16 DIAGNOSIS — I2782 Chronic pulmonary embolism: Secondary | ICD-10-CM | POA: Diagnosis not present

## 2020-01-16 DIAGNOSIS — Z7901 Long term (current) use of anticoagulants: Secondary | ICD-10-CM | POA: Diagnosis not present

## 2020-01-31 ENCOUNTER — Ambulatory Visit: Payer: Medicare Other | Attending: Internal Medicine

## 2020-01-31 DIAGNOSIS — Z23 Encounter for immunization: Secondary | ICD-10-CM

## 2020-01-31 NOTE — Progress Notes (Signed)
   Covid-19 Vaccination Clinic  Name:  Brittany [name]    MRN: [id number] DOB: [birth year]  01/31/2020  [name] was observed post Covid-19 immunization for 15 minutes without incident. She was provided with Vaccine Information Sheet and instruction to access the V-Safe system.   [name] was instructed to call 911 with any severe reactions post vaccine: Marland Kitchen Difficulty breathing  . Swelling of face and throat  . A fast heartbeat  . A bad rash all over body  . Dizziness and weakness   Immunizations Administered    Name Date Dose VIS Date Route   Pfizer COVID-19 Vaccine 01/31/2020  8:13 AM 0.3 mL 10/07/2019 Intramuscular   Manufacturer: [employer]   Lot: [id number]   [place]: [id number]

## 2020-02-13 DIAGNOSIS — Z7901 Long term (current) use of anticoagulants: Secondary | ICD-10-CM | POA: Diagnosis not present

## 2020-02-13 DIAGNOSIS — I2782 Chronic pulmonary embolism: Secondary | ICD-10-CM | POA: Diagnosis not present

## 2020-03-03 DIAGNOSIS — H2513 Age-related nuclear cataract, bilateral: Secondary | ICD-10-CM | POA: Diagnosis not present

## 2020-03-12 DIAGNOSIS — Z7901 Long term (current) use of anticoagulants: Secondary | ICD-10-CM | POA: Diagnosis not present

## 2020-03-12 DIAGNOSIS — I2782 Chronic pulmonary embolism: Secondary | ICD-10-CM | POA: Diagnosis not present

## 2020-05-17 DIAGNOSIS — I8291 Chronic embolism and thrombosis of unspecified vein: Secondary | ICD-10-CM | POA: Diagnosis not present

## 2020-05-17 DIAGNOSIS — Z7901 Long term (current) use of anticoagulants: Secondary | ICD-10-CM | POA: Diagnosis not present

## 2020-06-14 DIAGNOSIS — I2782 Chronic pulmonary embolism: Secondary | ICD-10-CM | POA: Diagnosis not present

## 2020-06-14 DIAGNOSIS — Z7901 Long term (current) use of anticoagulants: Secondary | ICD-10-CM | POA: Diagnosis not present

## 2020-07-16 DIAGNOSIS — Z7901 Long term (current) use of anticoagulants: Secondary | ICD-10-CM | POA: Diagnosis not present

## 2020-07-16 DIAGNOSIS — I2782 Chronic pulmonary embolism: Secondary | ICD-10-CM | POA: Diagnosis not present

## 2020-08-01 DIAGNOSIS — M7042 Prepatellar bursitis, left knee: Secondary | ICD-10-CM | POA: Diagnosis not present

## 2020-08-01 DIAGNOSIS — I8002 Phlebitis and thrombophlebitis of superficial vessels of left lower extremity: Secondary | ICD-10-CM | POA: Diagnosis not present

## 2020-08-13 DIAGNOSIS — I2782 Chronic pulmonary embolism: Secondary | ICD-10-CM | POA: Diagnosis not present

## 2020-08-13 DIAGNOSIS — Z7901 Long term (current) use of anticoagulants: Secondary | ICD-10-CM | POA: Diagnosis not present

## 2020-09-10 DIAGNOSIS — Z23 Encounter for immunization: Secondary | ICD-10-CM | POA: Diagnosis not present

## 2020-09-17 DIAGNOSIS — I2782 Chronic pulmonary embolism: Secondary | ICD-10-CM | POA: Diagnosis not present

## 2020-09-17 DIAGNOSIS — Z7901 Long term (current) use of anticoagulants: Secondary | ICD-10-CM | POA: Diagnosis not present

## 2020-10-12 ENCOUNTER — Other Ambulatory Visit: Payer: Self-pay

## 2020-10-12 ENCOUNTER — Ambulatory Visit
Admission: EM | Admit: 2020-10-12 | Discharge: 2020-10-12 | Disposition: A | Discharge status: Home / Self Care | Payer: Medicare Other | Attending: Urgent Care | Admitting: Urgent Care

## 2020-10-12 DIAGNOSIS — I493 Ventricular premature depolarization: Secondary | ICD-10-CM | POA: Diagnosis not present

## 2020-10-12 DIAGNOSIS — R531 Weakness: Secondary | ICD-10-CM

## 2020-10-12 DIAGNOSIS — R002 Palpitations: Secondary | ICD-10-CM

## 2020-10-12 NOTE — ED Provider Notes (Signed)
[place]   MRN: [id number] DOB: [birth year]  Subjective:   Brittany [name] is a 74 y.o. female presenting for acute onset today of slight weakness, palpitations and feeling off.  Patient has a history of 2 chest PEs and a DVT in the brachial vein of the left upper extremity.  She is on Coumadin for this.  Denies any history of heart conditions, heart failure, CAD.  Denies fever, headache, confusion, chest pain, shortness of breath, pain that radiates from her chest to her abdomen/neck/jaw/arm.  She is not a smoker.  She does not have a cardiologist.  No current facility-administered medications for this encounter.  Current Outpatient Medications:    acetaminophen (TYLENOL) 500 MG tablet, Take 500 mg by mouth every 6 (six) hours as needed for mild pain. , Disp: , Rfl:    anastrozole (ARIMIDEX) 1 MG tablet, Take 1 tablet (1 mg total) by mouth daily., Disp: 90 tablet, Rfl: 3   CALCIUM PO, Take 1 tablet by mouth daily. , Disp: , Rfl:    cholecalciferol (VITAMIN D) 1000 units tablet, Take 1,000 Units by mouth daily. , Disp: , Rfl:    docusate sodium (COLACE) 100 MG capsule, Take 1 capsule (100 mg total) by mouth 2 (two) times daily., Disp: 20 capsule, Rfl: 2   ferrous sulfate 325 (65 FE) MG tablet, Take 1 tablet (325 mg total) by mouth 2 (two) times daily with a meal., Disp: 60 tablet, Rfl: 3   MAGNESIUM PO, Take 1 tablet by mouth daily. , Disp: , Rfl:    pantoprazole (PROTONIX) 20 MG tablet, Take 1 tablet (20 mg total) by mouth daily., Disp: 30 tablet, Rfl: 0   warfarin (COUMADIN) 5 MG tablet, Take 1 tablet (5 mg total) by mouth daily. Except Tue and Thu (Patient taking differently: Take 5 mg by mouth See admin instructions. On all days except Tuesday, Thursday and Saturday. (On these days 7.5)), Disp: , Rfl:    warfarin (COUMADIN) 7.5 MG tablet, Take 1 tablet (7.5 mg total) by mouth daily. Tue and Thu (Patient taking differently: Take 5-7.5 mg by mouth See admin  instructions. On TUESDAYS), Disp: , Rfl:    No Known Allergies  Past Medical History:  Diagnosis Date   Acute deep vein thrombosis (DVT) of brachial vein of left upper extremity ([facility]) 06/14/2018   Chronic back pain    History of ectopic pregnancy    History of pulmonary embolism    anticoagulated on coumadin   Personal history of chemotherapy      Past Surgical History:  Procedure Laterality Date   BREAST BIOPSY Right 05/25/2017   malignant   BREAST BIOPSY Right 07/14/2017   BREAST BIOPSY Right 06/22/2017   malignant   BREAST LUMPECTOMY Right 05/2018   BREAST LUMPECTOMY WITH RADIOACTIVE SEED AND SENTINEL LYMPH NODE BIOPSY Right 06/08/2018   Procedure: RIGHT BREAST LUMPECTOMY WITH DOUBLE BRACKETED RADIOACTIVE SEEDS AND SENTINEL LYMPH NODE BIOPSY ERAS PATHWAY;  Surgeon: [name], MD;  Location: [facility];  Service: General;  Laterality: Right;  TAP BLOCK   HIP FRACTURE SURGERY     MINOR BREAST BIOPSY Right 06/13/2018   Procedure: EVACUATION OF RIGHT BREAST HEMATOMA;  Surgeon: [name], MD;  Location: [facility];  Service: General;  Laterality: Right;   TUBAL LIGATION Bilateral     Family History  Problem Relation Age of Onset   Prostate cancer Father    Prostate cancer Brother     Social History   Tobacco Use  Smoking status: Never Smoker   Smokeless tobacco: Never Used  Vaping Use   Vaping Use: Never used  Substance Use Topics   Alcohol use: No   Drug use: No    ROS   Objective:   Vitals: BP 140/83 (BP Location: Left Arm)    Pulse (!) 106    Temp 97.9 F (36.6 C) (Oral)    Resp 16    SpO2 97%   Wt Readings from Last 3 Encounters:  12/30/19 217 lb 4.8 oz (98.6 kg)  12/30/18 218 lb 1.6 oz (98.9 kg)  10/12/18 220 lb 8 oz (100 kg)   Temp Readings from Last 3 Encounters:  10/12/20 97.9 F (36.6 C) (Oral)  12/30/19 98.3 F (36.8 C) (Temporal)  06/04/19 98.6 F (37 C) (Oral)   BP Readings from Last 3  Encounters:  10/12/20 140/83  12/30/19 136/67  06/04/19 (!) 180/90   Pulse Readings from Last 3 Encounters:  10/12/20 (!) 106  12/30/19 86  06/04/19 84   Physical Exam Constitutional:      General: She is not in acute distress.    Appearance: Normal appearance. She is well-developed. She is not ill-appearing, toxic-appearing or diaphoretic.  HENT:     Head: Normocephalic and atraumatic.     Nose: Nose normal.     Mouth/Throat:     Mouth: Mucous membranes are moist.  Eyes:     Extraocular Movements: Extraocular movements intact.     Pupils: Pupils are equal, round, and reactive to light.  Cardiovascular:     Rate and Rhythm: Tachycardia present. Rhythm irregular.     Pulses: Normal pulses.     Heart sounds: Normal heart sounds. No murmur heard. No friction rub. No gallop.      Comments: Regularly irregular. Pulmonary:     Effort: Pulmonary effort is normal. No respiratory distress.     Breath sounds: Normal breath sounds. No stridor. No wheezing, rhonchi or rales.  Skin:    General: Skin is warm and dry.     Findings: No rash.  Neurological:     Mental Status: She is alert and oriented to person, place, and time.  Psychiatric:        Mood and Affect: Mood normal.        Behavior: Behavior normal.        Thought Content: Thought content normal.        Judgment: Judgment normal.     ED ECG REPORT   Date: 10/12/2020  EKG Time: 2:18 PM  Rate: 103bpm  Rhythm: premature ventricular contractions (PVC),  sinus tachycardia, occasional PVC noted, unifocal  Axis: normal  Intervals:none  ST&T Change: none  Narrative Interpretation: Sinus tachycardia at 103 bpm with occasional premature ventricular contractions.  The PVCs are different from previous EKG had sinus rhythm.  No acute findings.  Assessment and Plan :   PDMP not reviewed this encounter.  1. Premature ventricular beats   2. Weakness   3. Palpitations     There are no acute findings on the EKG.  There are  new findings of occasional PVC with borderline tachycardia.  At this time, recommended the patient follow-up with [facility] cardiology.  Referral was placed.  I do not see any alarming physical exam findings, symptoms or EKG findings warranting an ER visit. Counseled patient on potential for adverse effects with medications prescribed/recommended today, maintain strict ER and return-to-clinic precautions discussed, patient verbalized understanding.    Jaynee Eagles, PA-C 10/12/20 1430

## 2020-10-12 NOTE — ED Triage Notes (Signed)
Pt presents with weakness and "feeling irregular" today while at a social gathering.

## 2020-10-22 DIAGNOSIS — Z7901 Long term (current) use of anticoagulants: Secondary | ICD-10-CM | POA: Diagnosis not present

## 2020-10-22 DIAGNOSIS — Z86711 Personal history of pulmonary embolism: Secondary | ICD-10-CM | POA: Diagnosis not present

## 2020-11-02 ENCOUNTER — Telehealth: Payer: Self-pay | Admitting: Radiology

## 2020-11-02 ENCOUNTER — Encounter: Payer: Self-pay | Admitting: Internal Medicine

## 2020-11-02 ENCOUNTER — Ambulatory Visit (INDEPENDENT_AMBULATORY_CARE_PROVIDER_SITE_OTHER): Payer: Medicare Other | Admitting: Internal Medicine

## 2020-11-02 ENCOUNTER — Other Ambulatory Visit: Payer: Self-pay

## 2020-11-02 VITALS — BP 122/68 | HR 81 | Ht 65.0 in | Wt 211.0 lb

## 2020-11-02 DIAGNOSIS — R079 Chest pain, unspecified: Secondary | ICD-10-CM | POA: Diagnosis not present

## 2020-11-02 DIAGNOSIS — Z170 Estrogen receptor positive status [ER+]: Secondary | ICD-10-CM | POA: Diagnosis not present

## 2020-11-02 DIAGNOSIS — I493 Ventricular premature depolarization: Secondary | ICD-10-CM | POA: Diagnosis not present

## 2020-11-02 DIAGNOSIS — I2699 Other pulmonary embolism without acute cor pulmonale: Secondary | ICD-10-CM | POA: Insufficient documentation

## 2020-11-02 DIAGNOSIS — C50411 Malignant neoplasm of upper-outer quadrant of right female breast: Secondary | ICD-10-CM

## 2020-11-02 DIAGNOSIS — R002 Palpitations: Secondary | ICD-10-CM

## 2020-11-02 NOTE — Telephone Encounter (Signed)
Enrolled patient for a 30 day [employer] Event Monitor to be mailed to patients home  

## 2020-11-02 NOTE — Progress Notes (Signed)
Cardiology Office Note:    Date:  11/02/2020   ID:  BRITTANEY BEAULIEU, DOB 08-06-1946, MRN [id number]  PCP:  [name], MD  [facility] Cardiologist:  Werner Lean, MD  [place] Electrophysiologist:  None   CC: Family wants her to come. Consulted for the evaluation of PVCs at the behest of [name], MD  History of Present Illness:    Brittany [name] is a 75 y.o. female with a hx of PVCs, unprovoked PE and DVT on coumadin and R breast cancer, Morbid Obesity with  HLD, who presents for evaluation.  Oncological History notable for: Malignancies: DCIS grad 2 ER+, PR+, HER2-, Surgery: Prior Lumpectomy (breast conserving therapy 06/08/2018) Chemotherapy: Letrozole Cessations for Toxicity: None Radiation: No Oncology care spearheaded by: Dr. Lindi Adie  Patient notes that she has feeling of two events where she felt unrest.  Felt that she could feel her pulse in head and neck.  Has had no chest pain, chest pressure, chest tightness, chest stinging.  Discomfort when heart flutter occurs with spontaneous, worsens with no triggers, and improves with spontaneously.  Has had this occur once in the past month Patient exertion notable for going up and down stairs  and feels no symptoms.  No shortness of breath, DOE .  No PND or orthopnea.  No bendopnea, weight gain, leg swelling , or abdominal swelling.  No syncope or near syncope.  No history of pre-eclampsia or prematurity.  No Fen-Phen.  Past Medical History:  Diagnosis Date  . Acute deep vein thrombosis (DVT) of brachial vein of left upper extremity ([place]) 06/14/2018  . Chronic back pain   . History of ectopic pregnancy   . History of pulmonary embolism    anticoagulated on coumadin  . Personal history of chemotherapy     Past Surgical History:  Procedure Laterality Date  . BREAST BIOPSY Right 05/25/2017   malignant  . BREAST BIOPSY Right 07/14/2017  . BREAST BIOPSY Right 06/22/2017   malignant  . BREAST  LUMPECTOMY Right 05/2018  . BREAST LUMPECTOMY WITH RADIOACTIVE SEED AND SENTINEL LYMPH NODE BIOPSY Right 06/08/2018   Procedure: RIGHT BREAST LUMPECTOMY WITH DOUBLE BRACKETED RADIOACTIVE SEEDS AND SENTINEL LYMPH NODE BIOPSY ERAS PATHWAY;  Surgeon: [name], MD;  Location: [place];  Service: General;  Laterality: Right;  TAP BLOCK  . HIP FRACTURE SURGERY    . MINOR BREAST BIOPSY Right 06/13/2018   Procedure: EVACUATION OF RIGHT BREAST HEMATOMA;  Surgeon: [name], MD;  Location: [place];  Service: General;  Laterality: Right;  . TUBAL LIGATION Bilateral     Current Medications: Current Meds  Medication Sig  . acetaminophen (TYLENOL) 500 MG tablet Take 500 mg by mouth every 6 (six) hours as needed for mild pain.   Marland Kitchen anastrozole (ARIMIDEX) 1 MG tablet Take 1 tablet (1 mg total) by mouth daily.  . Ascorbic Acid (VITAMIN C) 1000 MG tablet Take 1,000 mg by mouth daily.  Marland Kitchen CALCIUM PO Take 600 mg by mouth daily.  . cholecalciferol (VITAMIN D) 1000 units tablet Take 1,000 Units by mouth daily.   Marland Kitchen docusate sodium (COLACE) 100 MG capsule Take 1 capsule (100 mg total) by mouth 2 (two) times daily.  . ferrous sulfate 325 (65 FE) MG tablet Take 1 tablet (325 mg total) by mouth 2 (two) times daily with a meal.  . MAGNESIUM PO Take 500 mg by mouth daily.  Marland Kitchen warfarin (COUMADIN) 5 MG tablet Take 1 tablet (5 mg total) by mouth  daily. Except Tue and Thu (Patient taking differently: Take 5 mg by mouth See admin instructions. On mon, wed, fri, sun)  . warfarin (COUMADIN) 7.5 MG tablet Take 1 tablet (7.5 mg total) by mouth daily. Tue and Thu (Patient taking differently: Take 5-7.5 mg by mouth See admin instructions. On Tues, thur sat)     Allergies:   Patient has no known allergies.   Social History   Socioeconomic History  . Marital status: Married    Spouse name: Not on file  . Number of children: Not on file  . Years of education: Not on file  . Highest education level:  Not on file  Occupational History  . Not on file  Tobacco Use  . Smoking status: Never Smoker  . Smokeless tobacco: Never Used  Vaping Use  . Vaping Use: Never used  Substance and Sexual Activity  . Alcohol use: No  . Drug use: No  . Sexual activity: Not on file  Other Topics Concern  . Not on file  Social History Narrative  . Not on file   Social Determinants of Health   Financial Resource Strain: Not on file  Food Insecurity: Not on file  Transportation Needs: Not on file  Physical Activity: Not on file  Stress: Not on file  Social Connections: Not on file     Family History: History of coronary artery disease notable for father. History of heart failure notable for no members. History of arrhythmia notable for no members. Denies family history of sudden cardiac death including drowning, car accidents, or unexplained deaths in the family except of father who died during chemotherapy. No history of bicuspid aortic valve or aortic aneurysm or dissection.  ROS:   Please see the history of present illness.    Chronic left arm pain  All other systems reviewed and are negative.  EKGs/Labs/Other Studies Reviewed:    The following studies were reviewed today:  EKG:  EKG is ordered today.  The ekg ordered today demonstrates  SR 81 rare PVCs lateral ST depressions  Recent Labs: No results found for requested labs within last 8760 hours.  Recent Lipid Panel No results found for: CHOL, TRIG, HDL, CHOLHDL, VLDL, LDLCALC, LDLDIRECT  2020 cholesterol Total 242 HDL 59 LDL 162 TGs 104  Risk Assessment/Calculations:     ASCVD 18%+  Physical Exam:    VS:  BP 122/68   Pulse 81   Ht _0  (1.651 m)   Wt 211 lb (95.7 kg)   SpO2 98%   BMI 35.11 kg/m     Wt Readings from Last 3 Encounters:  11/02/20 211 lb (95.7 kg)  12/30/19 217 lb 4.8 oz (98.6 kg)  12/30/18 218 lb 1.6 oz (98.9 kg)     GEN: Obese well developed in no acute distress HEENT: Normal NECK: No JVD;  No carotid bruits LYMPHATICS: No lymphadenopathy CARDIAC: RRR, no murmurs, rubs, gallops RESPIRATORY:  Clear to auscultation without rales, wheezing or rhonchi  ABDOMEN: Soft, non-tender, non-distended MUSCULOSKELETAL:  No edema; No deformity  SKIN: Warm and dry NEUROLOGIC:  Alert and oriented x 3 PSYCHIATRIC:  Normal affect   ASSESSMENT:    1. Palpitations   2. PVC (premature ventricular contraction)   3. Malignant neoplasm of upper-outer quadrant of right breast in female, estrogen receptor positive ([place])   4. Pulmonary embolism, unspecified chronicity, unspecified pulmonary embolism type, unspecified whether acute cor pulmonale present ([place])   5. Chest pain, unspecified type    PLAN:  In order of problems listed above:  Palpitations; possible SVT PVCs - will obtain 30-day live live heart monitor ([employer])  Breast Cancer: DCIS grad 2 ER+, PR+, HER2-, Prior Malignancy but without Cardiotoxic Chemotherapy HLD Pulmonary embolisum - with Age ?12 y - ASCVD 18%+  - LV Function including GLS or other Strain, 3D Eval, MAPSE, or CMR: not done - Mild/moderate severe CTR-CD (EF < 40%, 40-49%, 15% reduction in GLS) - Encouraged exercise on a regular basis and healthy dietary habits - AC per PCP - at next visit will get lipid panel and LFTs (patient to come fasting)    Medication Adjustments/Labs and Tests Ordered: Current medicines are reviewed at length with the patient today.  Concerns regarding medicines are outlined above.  Orders Placed This Encounter  Procedures  . CARDIAC EVENT MONITOR  . EKG 12-Lead  . ECHOCARDIOGRAM COMPLETE   No orders of the defined types were placed in this encounter.   Patient Instructions  Medication Instructions:  Your physician recommends that you continue on your current medications as directed. Please refer to the Current Medication list given to you today.  *If you need a refill on your cardiac medications before your next  appointment, please call your pharmacy*   Lab Work: None - If you have labs (blood work) drawn today and your tests are completely normal, you will receive your results only by: Marland Kitchen MyChart Message (if you have MyChart) OR . A paper copy in the mail If you have any lab test that is abnormal or we need to change your treatment, we will call you to review the results.   Testing/Procedures: Your physician has requested that you have an echocardiogram. Echocardiography is a painless test that uses sound waves to create images of your heart. It provides your doctor with information about the size and shape of your heart and how well your heart's chambers and valves are working. This procedure takes approximately one hour. There are no restrictions for this procedure.  Your physician has recommended that you wear an event monitor. Event monitors are medical devices that record the heart's electrical activity. Doctors most often [place] these monitors to diagnose arrhythmias. Arrhythmias are problems with the speed or rhythm of the heartbeat. The monitor is a small, portable device. You can wear one while you do your normal daily activities. This is usually used to diagnose what is causing palpitations/syncope (passing out).     Follow-Up: At [facility], you and your health needs are our priority.  As part of our continuing mission to provide you with exceptional heart care, we have created designated Provider Care Teams.  These Care Teams include your primary Cardiologist (physician) and Advanced Practice Providers (APPs -  Physician Assistants and Nurse Practitioners) who all work together to provide you with the care you need, when you need it.  We recommend signing up for the patient portal called "MyChart".  Sign up information is provided on this After Visit Summary.  MyChart is used to connect with patients for Virtual Visits (Telemedicine).  Patients are able to view lab/test results, encounter  notes, upcoming appointments, etc.  Non-urgent messages can be sent to your provider as well.   To learn more about what you can do with MyChart, go to [email or web address].    Your next appointment:   3 months Please fast for this appointment.  Please schedule for early AM  The format for your next appointment:   In Person  Provider:   You may see  Werner Lean, MD or one of the following Advanced Practice Providers on your designated Care Team:    Melina Copa, PA-C  Ermalinda Barrios, PA-C    Other Instructions [employer] Cardiac Event Monitor Instructions Your physician has requested you wear your cardiac event monitor for __30___ days, (1-30). [employer] may call or text to confirm a shipping address. The monitor will be sent to a land address via UPS. [employer] will not ship a monitor to a PO BOX. It typically takes 3-5 days to receive your monitor after it has been enrolled. [employer] will assist with USPS tracking if your package is delayed. The telephone number for [employer] is [phone]. Once you have received your monitor, please review the enclosed instructions. Instruction tutorials can also be viewed under help and settings on the enclosed cell phone. Your monitor has already been registered assigning a specific monitor serial # to you.  Applying the monitor Remove cell phone from case and turn it on. The cell phone works as Dealer and needs to be within [employer] of you at all times. The cell phone will need to be charged on a daily basis. We recommend you plug the cell phone into the enclosed charger at your bedside table every night.  Monitor batteries: You will receive two monitor batteries labelled #1 and #2. These are your recorders. Plug battery #2 onto the second connection on the enclosed charger. Keep one battery on the charger at all times. This will keep the monitor battery deactivated. It will also keep it fully charged for when  you need to switch your monitor batteries. A small light will be blinking on the battery emblem when it is charging. The light on the battery emblem will remain on when the battery is fully charged.  Open package of a Monitor strip. Insert battery #1 into black hood on strip and gently squeeze monitor battery onto connection as indicated in instruction booklet. Set aside while preparing skin.  Choose location for your strip, vertical or horizontal, as indicated in the instruction booklet. Shave to remove all hair from location. There cannot be any lotions, oils, powders, or colognes on skin where monitor is to be applied. Wipe skin clean with enclosed Saline wipe. Dry skin completely.  Peel paper labeled #1 off the back of the Monitor strip exposing the adhesive. Place the monitor on the chest in the vertical or horizontal position shown in the instruction booklet. One arrow on the monitor strip must be pointing upward. Carefully remove paper labeled #2, attaching remainder of strip to your skin. Try not to create any folds or wrinkles in the strip as you apply it.  Firmly press and release the circle in the center of the monitor battery. You will hear a small beep. This is turning the monitor battery on. The heart emblem on the monitor battery will light up every 5 seconds if the monitor battery in turned on and connected to the patient securely. Do not push and hold the circle down as this turns the monitor battery off. The cell phone will locate the monitor battery. A screen will appear on the cell phone checking the connection of your monitor strip. This may read poor connection initially but change to good connection within the next minute. Once your monitor accepts the connection you will hear a series of 3 beeps followed by a climbing crescendo of beeps. A screen will appear on the cell phone showing the two monitor strip placement options. Touch the picture  that demonstrates where you  applied the monitor strip.  Your monitor strip and battery are waterproof. You are able to shower, bathe, or swim with the monitor on. They just ask you do not submerge deeper than 3 feet underwater. We recommend removing the monitor if you are swimming in a lake, river, or ocean.  Your monitor battery will need to be switched to a fully charged monitor battery approximately once a week. The cell phone will alert you of an action which needs to be made.  On the cell phone, tap for details to reveal connection status, monitor battery status, and cell phone battery status. The green dots indicates your monitor is in good status. A red dot indicates there is something that needs your attention.  To record a symptom, click the circle on the monitor battery. In 30-60 seconds a list of symptoms will appear on the cell phone. Select your symptom and tap save. Your monitor will record a sustained or significant arrhythmia regardless of you clicking the button. Some patients do not feel the heart rhythm irregularities. [employer] will notify us of any serious or critical events.  Refer to instruction booklet for instructions on switching batteries, changing strips, the Do not disturb or Pause features, or any additional questions.  Call [employer] at [phone], to confirm your monitor is transmitting and record your baseline. They will answer any questions you may have regarding the monitor instructions at that time.  Returning the monitor to [place] all equipment back into blue box. Peel off strip of paper to expose adhesive and close box securely. There is a prepaid UPS shipping label on this box. Drop in a UPS drop box, or at a UPS facility like [employer]. You may also contact [employer] to arrange UPS to pick up monitor package at your home.      Signed, Werner Lean, MD  11/02/2020 12:57 PM    [place]

## 2020-11-02 NOTE — Patient Instructions (Signed)
Medication Instructions:  Your physician recommends that you continue on your current medications as directed. Please refer to the Current Medication list given to you today.  *If you need a refill on your cardiac medications before your next appointment, please call your pharmacy*   Lab Work: None - If you have labs (blood work) drawn today and your tests are completely normal, you will receive your results only by: Marland Kitchen MyChart Message (if you have MyChart) OR . A paper copy in the mail If you have any lab test that is abnormal or we need to change your treatment, we will call you to review the results.   Testing/Procedures: Your physician has requested that you have an echocardiogram. Echocardiography is a painless test that uses sound waves to create images of your heart. It provides your doctor with information about the size and shape of your heart and how well your heart's chambers and valves are working. This procedure takes approximately one hour. There are no restrictions for this procedure.  Your physician has recommended that you wear an event monitor. Event monitors are medical devices that record the heart's electrical activity. Doctors most often [place] these monitors to diagnose arrhythmias. Arrhythmias are problems with the speed or rhythm of the heartbeat. The monitor is a small, portable device. You can wear one while you do your normal daily activities. This is usually used to diagnose what is causing palpitations/syncope (passing out).     Follow-Up: At [facility], you and your health needs are our priority.  As part of our continuing mission to provide you with exceptional heart care, we have created designated Provider Care Teams.  These Care Teams include your primary Cardiologist (physician) and Advanced Practice Providers (APPs -  Physician Assistants and Nurse Practitioners) who all work together to provide you with the care you need, when you need it.  We recommend  signing up for the patient portal called "MyChart".  Sign up information is provided on this After Visit Summary.  MyChart is used to connect with patients for Virtual Visits (Telemedicine).  Patients are able to view lab/test results, encounter notes, upcoming appointments, etc.  Non-urgent messages can be sent to your provider as well.   To learn more about what you can do with MyChart, go to [email or web address].    Your next appointment:   3 months Please fast for this appointment.  Please schedule for early AM  The format for your next appointment:   In Person  Provider:   You may see Werner Lean, MD or one of the following Advanced Practice Providers on your designated Care Team:    Melina Copa, PA-C  Ermalinda Barrios, PA-C    Other Instructions [employer] Cardiac Event Monitor Instructions Your physician has requested you wear your cardiac event monitor for __30___ days, (1-30). [employer] may call or text to confirm a shipping address. The monitor will be sent to a land address via UPS. [employer] will not ship a monitor to a PO BOX. It typically takes 3-5 days to receive your monitor after it has been enrolled. [employer] will assist with USPS tracking if your package is delayed. The telephone number for [employer] is [phone]. Once you have received your monitor, please review the enclosed instructions. Instruction tutorials can also be viewed under help and settings on the enclosed cell phone. Your monitor has already been registered assigning a specific monitor serial # to you.  Applying the monitor Remove cell phone from case and turn it on.  The cell phone works as Dealer and needs to be within [employer] of you at all times. The cell phone will need to be charged on a daily basis. We recommend you plug the cell phone into the enclosed charger at your bedside table every night.  Monitor batteries: You will receive two monitor batteries labelled  #1 and #2. These are your recorders. Plug battery #2 onto the second connection on the enclosed charger. Keep one battery on the charger at all times. This will keep the monitor battery deactivated. It will also keep it fully charged for when you need to switch your monitor batteries. A small light will be blinking on the battery emblem when it is charging. The light on the battery emblem will remain on when the battery is fully charged.  Open package of a Monitor strip. Insert battery #1 into black hood on strip and gently squeeze monitor battery onto connection as indicated in instruction booklet. Set aside while preparing skin.  Choose location for your strip, vertical or horizontal, as indicated in the instruction booklet. Shave to remove all hair from location. There cannot be any lotions, oils, powders, or colognes on skin where monitor is to be applied. Wipe skin clean with enclosed Saline wipe. Dry skin completely.  Peel paper labeled #1 off the back of the Monitor strip exposing the adhesive. Place the monitor on the chest in the vertical or horizontal position shown in the instruction booklet. One arrow on the monitor strip must be pointing upward. Carefully remove paper labeled #2, attaching remainder of strip to your skin. Try not to create any folds or wrinkles in the strip as you apply it.  Firmly press and release the circle in the center of the monitor battery. You will hear a small beep. This is turning the monitor battery on. The heart emblem on the monitor battery will light up every 5 seconds if the monitor battery in turned on and connected to the patient securely. Do not push and hold the circle down as this turns the monitor battery off. The cell phone will locate the monitor battery. A screen will appear on the cell phone checking the connection of your monitor strip. This may read poor connection initially but change to good connection within the next minute. Once your  monitor accepts the connection you will hear a series of 3 beeps followed by a climbing crescendo of beeps. A screen will appear on the cell phone showing the two monitor strip placement options. Touch the picture that demonstrates where you applied the monitor strip.  Your monitor strip and battery are waterproof. You are able to shower, bathe, or swim with the monitor on. They just ask you do not submerge deeper than 3 feet underwater. We recommend removing the monitor if you are swimming in a lake, river, or ocean.  Your monitor battery will need to be switched to a fully charged monitor battery approximately once a week. The cell phone will alert you of an action which needs to be made.  On the cell phone, tap for details to reveal connection status, monitor battery status, and cell phone battery status. The green dots indicates your monitor is in good status. A red dot indicates there is something that needs your attention.  To record a symptom, click the circle on the monitor battery. In 30-60 seconds a list of symptoms will appear on the cell phone. Select your symptom and tap save. Your monitor will record a sustained  or significant arrhythmia regardless of you clicking the button. Some patients do not feel the heart rhythm irregularities. [employer] will notify us of any serious or critical events.  Refer to instruction booklet for instructions on switching batteries, changing strips, the Do not disturb or Pause features, or any additional questions.  Call [employer] at [phone], to confirm your monitor is transmitting and record your baseline. They will answer any questions you may have regarding the monitor instructions at that time.  Returning the monitor to [place] all equipment back into blue box. Peel off strip of paper to expose adhesive and close box securely. There is a prepaid UPS shipping label on this box. Drop in a UPS drop box, or at a UPS facility  like [employer]. You may also contact [employer] to arrange UPS to pick up monitor package at your home.

## 2020-11-08 ENCOUNTER — Ambulatory Visit (INDEPENDENT_AMBULATORY_CARE_PROVIDER_SITE_OTHER): Payer: Medicare Other

## 2020-11-08 DIAGNOSIS — R079 Chest pain, unspecified: Secondary | ICD-10-CM | POA: Diagnosis not present

## 2020-11-08 DIAGNOSIS — I493 Ventricular premature depolarization: Secondary | ICD-10-CM

## 2020-11-08 DIAGNOSIS — R002 Palpitations: Secondary | ICD-10-CM

## 2020-11-21 ENCOUNTER — Other Ambulatory Visit (HOSPITAL_COMMUNITY): Payer: Medicare Other

## 2020-12-03 DIAGNOSIS — I8291 Chronic embolism and thrombosis of unspecified vein: Secondary | ICD-10-CM | POA: Diagnosis not present

## 2020-12-03 DIAGNOSIS — Z7901 Long term (current) use of anticoagulants: Secondary | ICD-10-CM | POA: Diagnosis not present

## 2020-12-10 DIAGNOSIS — I2782 Chronic pulmonary embolism: Secondary | ICD-10-CM | POA: Diagnosis not present

## 2020-12-10 DIAGNOSIS — Z7901 Long term (current) use of anticoagulants: Secondary | ICD-10-CM | POA: Diagnosis not present

## 2020-12-17 ENCOUNTER — Telehealth (HOSPITAL_COMMUNITY): Payer: Self-pay | Admitting: Internal Medicine

## 2020-12-17 DIAGNOSIS — M65312 Trigger thumb, left thumb: Secondary | ICD-10-CM | POA: Diagnosis not present

## 2020-12-17 NOTE — Telephone Encounter (Signed)
12/17/20-patient cancelled on 11/21/20 and 12/19/20 and did not wish to reschedule at this time/LBW 9:23 Order will be removed from the [place] [facility] and if patient calls back to reschedule we can reinstate the order.

## 2020-12-19 ENCOUNTER — Other Ambulatory Visit (HOSPITAL_COMMUNITY): Payer: Medicare Other

## 2020-12-19 ENCOUNTER — Encounter (HOSPITAL_COMMUNITY): Payer: Self-pay

## 2021-01-09 DIAGNOSIS — Z7901 Long term (current) use of anticoagulants: Secondary | ICD-10-CM | POA: Diagnosis not present

## 2021-01-09 DIAGNOSIS — I8291 Chronic embolism and thrombosis of unspecified vein: Secondary | ICD-10-CM | POA: Diagnosis not present

## 2021-01-13 NOTE — Progress Notes (Signed)
Patient Care Team: [name], MD as PCP - General (Family Medicine) Werner Lean, MD as PCP - Cardiology (Cardiology) [name], MD as Consulting Physician (Hematology and Oncology) [name], MD as Consulting Physician (General Surgery) [name], MD as Consulting Physician (Radiation Oncology) [name], NP as Nurse Practitioner (Hematology and Oncology)  DIAGNOSIS:    ICD-10-CM   1. Malignant neoplasm of upper-outer quadrant of right breast in female, estrogen receptor positive ([place])  C50.411    Z17.0     SUMMARY OF ONCOLOGIC HISTORY: Oncology History Overview Note  8/   Malignant neoplasm of upper-outer quadrant of right breast in female, estrogen receptor positive ([place])  06/19/2017 Initial Diagnosis   Intermittent bloody nipple discharge in the right breast, mammogram showed asymmetry right breast upper outer quadrant ultrasound negative biopsy 05/26/2017 fibrocystic changes ; breast MRI revealed 5.6 x 3.4 cm non-mass enhancement, MRI guided biopsy revealed: IDC with DCIS, grade 2, ER 100%, PR 15%, Ki-67 5%, T3 N0 stage IIA AJCC 8 clinical stage   07/03/2017 Oncotype testing   Oncotype DX score 18: Risk of recurrence 11%   07/03/2017 -  Anti-estrogen oral therapy   Letrozole 2.5 mg daily neoadjuvant hormone therapy   06/08/2018 Surgery   Right lumpectomy: IDC grade 1, 0.3 cm, intermediate grade DCIS, margins negative, 0/5 lymph nodes negative, T1 a N0 stage Ia   06/12/2018 Surgery   Evacuation of right breast postoperative hematoma most likely due to left brachial vein DVT on anticoagulation   06/23/2018 Cancer Staging   Staging form: Breast, AJCC 8th Edition - Pathologic: Stage IA (pT1a, pN0, cM0, G1, ER+, PR+, HER2-, Oncotype DX score: 18) - Signed by [name], NP on 10/12/2018     CHIEF COMPLIANT: Follow-up of right breast cancer on letrozole  INTERVAL HISTORY: Brittany [name] is a 75 y.o. with above-mentioned  history of right breast cancer treated with 1 year of neoadjuvant antiestrogen therapy followed by lumpectomy and adjuvant antiestrogen therapy with letrozole. She is currently on oral iron therapy due to low hemoglobin following her breast surgeriesand Warfarin for a history of DVTs and PEs.She presents to the clinictoday for annual follow-up.  Patient is experiencing lots of pains in her joints as result of anastrozole therapy.  It could also be osteoarthritis.  She also has poor appetite and because that she has lost some weight.  ALLERGIES:  has No Known Allergies.  MEDICATIONS:  Current Outpatient Medications  Medication Sig Dispense Refill  . acetaminophen (TYLENOL) 500 MG tablet Take 500 mg by mouth every 6 (six) hours as needed for mild pain.     Marland Kitchen anastrozole (ARIMIDEX) 1 MG tablet Take 1 tablet (1 mg total) by mouth daily. 90 tablet 3  . Ascorbic Acid (VITAMIN C) 1000 MG tablet Take 1,000 mg by mouth daily.    Marland Kitchen CALCIUM PO Take 600 mg by mouth daily.    . cholecalciferol (VITAMIN D) 1000 units tablet Take 1,000 Units by mouth daily.     Marland Kitchen docusate sodium (COLACE) 100 MG capsule Take 1 capsule (100 mg total) by mouth 2 (two) times daily. 20 capsule 2  . ferrous sulfate 325 (65 FE) MG tablet Take 1 tablet (325 mg total) by mouth 2 (two) times daily with a meal. 60 tablet 3  . MAGNESIUM PO Take 500 mg by mouth daily.    Marland Kitchen warfarin (COUMADIN) 5 MG tablet Take 1 tablet (5 mg total) by mouth daily. Except Tue and Thu (Patient taking differently:  Take 5 mg by mouth See admin instructions. On mon, wed, fri, sun)    . warfarin (COUMADIN) 7.5 MG tablet Take 1 tablet (7.5 mg total) by mouth daily. Tue and Thu (Patient taking differently: Take 5-7.5 mg by mouth See admin instructions. On Tues, thur sat)     No current facility-administered medications for this visit.    PHYSICAL EXAMINATION: ECOG PERFORMANCE STATUS: 1 - Symptomatic but completely ambulatory  Vitals:   01/14/21 1105  BP:  (!) 137/59  Pulse: 69  Resp: 20  Temp: (!) 97.5 F (36.4 C)  SpO2: 100%   Filed Weights   01/14/21 1105  Weight: 208 lb 9.6 oz (94.6 kg)    BREAST: No palpable masses or nodules in either right or left breasts. No palpable axillary supraclavicular or infraclavicular adenopathy no breast tenderness or nipple discharge. (exam performed in the presence of a chaperone)  LABORATORY DATA:  I have reviewed the data as listed CMP Latest Ref Rng & Units 06/04/2019 06/12/2018 06/04/2018  Glucose 70 - 99 mg/dL 96 183(H) 88  BUN 8 - 23 mg/dL 11 10 11   Creatinine 0.44 - 1.00 mg/dL 0.77 0.35(L) 0.72  Sodium 135 - 145 mmol/L 141 138 140  Potassium 3.5 - 5.1 mmol/L 3.9 4.4 4.6  Chloride 98 - 111 mmol/L 108 105 110  CO2 22 - 32 mmol/L 25 23 22   Calcium 8.9 - 10.3 mg/dL 9.4 9.0 9.0  Total Protein 6.5 - 8.1 g/dL 7.0 6.6 6.8  Total Bilirubin 0.3 - 1.2 mg/dL 0.4 0.6 0.6  Alkaline Phos 38 - 126 U/L 100 122 86  AST 15 - 41 U/L 15 76(H) 18  ALT 0 - 44 U/L 12 146(H) 14    Lab Results  Component Value Date   WBC 7.4 06/04/2019   HGB 12.6 06/04/2019   HCT 41.1 06/04/2019   MCV 82.7 06/04/2019   PLT 371 06/04/2019   NEUTROABS 4.1 12/30/2018    ASSESSMENT & PLAN:  Malignant neoplasm of upper-outer quadrant of right breast in female, estrogen receptor positive ([facility]) Intermittent bloody nipple discharge in the right breast, mammogram showed asymmetry right breast upper outer quadrant ultrasound negative biopsy 05/26/2017 fibrocystic changes ; Breast MRI revealed 5.6 x 3.4 cm non-mass enhancement, MRI guided biopsy revealed: IDC with DCIS, grade 2, ER 100%, PR 15%, Ki-67 5%, T3 N0 stage IIA AJCC 8 clinical stage Right lumpectomy: IDC grade 1, 0.3 cm, intermediate grade DCIS, margins negative, 0/5 lymph nodes negative, T1 a N0 stage Ia  Oncotype DX score18: 11% risk of recurrence  Treatment plan: 1.neoadjuvant hormonal therapy versus chemotherapy9/04/2017-06/05/2018 2.Breast conserving  surgery8/13/2019 3. Adjuvant radiation therapy(given the grade one 0.3 cm final tumor size, patient decided not to undergo adjuvant radiation) 4. Adjuvant antiestrogen therapy -------------------------------------------------------------------------- Current treatment:Anastrozole 1 mg dailystarted 06/29/2018 Anastrozole toxicities: Severe joint aches and pains I discussed with her about stopping anastrozole for couple of weeks and starting letrozole after that.   I will connect with her in 6 weeks to discuss tolerance to letrozole therapy.  reast cancer surveillance: 1.Breast exam 01/15/2019: Benign 2.Mammogram: 12/10/2018: Benign, breast density category B, she will need a new mammogram this year.  Telephone visit in 6 weeks for follow-up to discuss tolerance to letrozole.  No orders of the defined types were placed in this encounter.  The patient has a good understanding of the overall plan. she agrees with it. she will call with any problems that may develop before the next visit here.  Total time spent: 20  mins including face to face time and time spent for planning, charting and coordination of care  Rulon Eisenmenger, MD, MPH 01/14/2021  I, Molly Dorshimer, am acting as scribe for Dr. [name].  I have reviewed the above documentation for accuracy and completeness, and I agree with the above.

## 2021-01-14 ENCOUNTER — Telehealth: Payer: Self-pay | Admitting: Hematology and Oncology

## 2021-01-14 ENCOUNTER — Inpatient Hospital Stay: Payer: Medicare Other | Attending: Hematology and Oncology | Admitting: Hematology and Oncology

## 2021-01-14 ENCOUNTER — Other Ambulatory Visit: Payer: Self-pay

## 2021-01-14 DIAGNOSIS — C50411 Malignant neoplasm of upper-outer quadrant of right female breast: Secondary | ICD-10-CM | POA: Insufficient documentation

## 2021-01-14 DIAGNOSIS — Z170 Estrogen receptor positive status [ER+]: Secondary | ICD-10-CM | POA: Insufficient documentation

## 2021-01-14 DIAGNOSIS — Z79811 Long term (current) use of aromatase inhibitors: Secondary | ICD-10-CM | POA: Diagnosis not present

## 2021-01-14 MED ORDER — LETROZOLE 2.5 MG PO TABS: 2.5000 mg | ORAL_TABLET | Freq: Every day | ORAL | 0 refills | Status: DC

## 2021-01-14 NOTE — Telephone Encounter (Signed)
Scheduled appointment per 3/21 los. Pt aware.

## 2021-01-14 NOTE — Assessment & Plan Note (Signed)
Intermittent bloody nipple discharge in the right breast, mammogram showed asymmetry right breast upper outer quadrant ultrasound negative biopsy 05/26/2017 fibrocystic changes ; Breast MRI revealed 5.6 x 3.4 cm non-mass enhancement, MRI guided biopsy revealed: IDC with DCIS, grade 2, ER 100%, PR 15%, Ki-67 5%, T3 N0 stage IIA AJCC 8 clinical stage Right lumpectomy: IDC grade 1, 0.3 cm, intermediate grade DCIS, margins negative, 0/5 lymph nodes negative, T1 a N0 stage Ia  Oncotype DX score18: 11% risk of recurrence  Treatment plan: 1.neoadjuvant hormonal therapy versus chemotherapy9/04/2017-06/05/2018 2.Breast conserving surgery8/13/2019 3. Adjuvant radiation therapy(given the grade one 0.3 cm final tumor size, patient decided not to undergo adjuvant radiation) 4. Adjuvant antiestrogen therapy -------------------------------------------------------------------------- Current treatment:Anastrozole 1 mg dailystarted 06/29/2018 Letrozole toxicities: 1.  Moderate hot flashes 2.Joint stiffness when she wakes up  Breast cancer surveillance: 1.Breast exam 01/15/2019: Benign 2.Mammogram: 12/10/2018: Benign, breast density category B, she will need a new mammogram this year.  Return to clinic in 1 year for follow-up

## 2021-02-04 NOTE — Progress Notes (Deleted)
Cardiology Office Note:    Date:  02/04/2021   ID:  Brittany [name], DOB Aug 02, 1946, MRN [id number]  PCP:  [name], MD  [facility] Cardiologist:  Werner Lean, MD  [place] Electrophysiologist:  None   CC: Family wants her to come. Consulted for the evaluation of PVCs at the behest of [name], MD  History of Present Illness:    Brittany [name] is a 75 y.o. female with a hx of PVCs, unprovoked PE and DVT on coumadin and R breast cancer, Morbid Obesity with  HLD, who presents for evaluation 11/02/20. In interim of this visit, patient occasional PVCs on heart monitor and we offered a trial of metoprolol.   Oncological History notable for: Malignancies: DCIS grad 2 ER+, PR+, HER2-, Surgery: Prior Lumpectomy (breast conserving therapy 06/08/2018) Chemotherapy: Letrozole Cessations for Toxicity: None Radiation: No Oncology care spearheaded by: Dr. Lindi Adie  Patient notes that she is doing ***.  Since day prior/last visit notes *** changes.  Relevant interval testing or therapy include ***.  There are no*** interval hospital/ED visit.    No chest pain or pressure ***.  No SOB/DOE*** and no PND/Orthopnea***.  No weight gain or leg swelling***.  No palpitations or syncope ***.  Ambulatory blood pressure ***.   Past Medical History:  Diagnosis Date  . Acute deep vein thrombosis (DVT) of brachial vein of left upper extremity ([place]) 06/14/2018  . Chronic back pain   . History of ectopic pregnancy   . History of pulmonary embolism    anticoagulated on coumadin  . Personal history of chemotherapy     Past Surgical History:  Procedure Laterality Date  . BREAST BIOPSY Right 05/25/2017   malignant  . BREAST BIOPSY Right 07/14/2017  . BREAST BIOPSY Right 06/22/2017   malignant  . BREAST LUMPECTOMY Right 05/2018  . BREAST LUMPECTOMY WITH RADIOACTIVE SEED AND SENTINEL LYMPH NODE BIOPSY Right 06/08/2018   Procedure: RIGHT BREAST LUMPECTOMY WITH DOUBLE  BRACKETED RADIOACTIVE SEEDS AND SENTINEL LYMPH NODE BIOPSY ERAS PATHWAY;  Surgeon: [name], MD;  Location: [facility];  Service: General;  Laterality: Right;  TAP BLOCK  . HIP FRACTURE SURGERY    . MINOR BREAST BIOPSY Right 06/13/2018   Procedure: EVACUATION OF RIGHT BREAST HEMATOMA;  Surgeon: [name], MD;  Location: [facility];  Service: General;  Laterality: Right;  . TUBAL LIGATION Bilateral     Current Medications: No outpatient medications have been marked as taking for the 02/13/21 encounter (Appointment) with Werner Lean, MD.     Allergies:   Patient has no known allergies.   Social History   Socioeconomic History  . Marital status: Married    Spouse name: Not on file  . Number of children: Not on file  . Years of education: Not on file  . Highest education level: Not on file  Occupational History  . Not on file  Tobacco Use  . Smoking status: Never Smoker  . Smokeless tobacco: Never Used  Vaping Use  . Vaping Use: Never used  Substance and Sexual Activity  . Alcohol use: No  . Drug use: No  . Sexual activity: Not on file  Other Topics Concern  . Not on file  Social History Narrative  . Not on file   Social Determinants of Health   Financial Resource Strain: Not on file  Food Insecurity: Not on file  Transportation Needs: Not on file  Physical Activity: Not on file  Stress: Not on file  Social Connections: Not on file     Family History: History of coronary artery disease notable for father. History of heart failure notable for no members. History of arrhythmia notable for no members. Denies family history of sudden cardiac death including drowning, car accidents, or unexplained deaths in the family except of father who died during chemotherapy. No history of bicuspid aortic valve or aortic aneurysm or dissection.  ROS:   Please see the history of present illness.      All other systems reviewed and are  negative.  EKGs/Labs/Other Studies Reviewed:    The following studies were reviewed today:  EKG:  11/02/20 SR 81 rare PVCs lateral ST depressions  Cardiac Event Monitoring: Date: 12/10/20 Results:  Patient had a minimum heart rate of 53 bpm, maximum heart rate of 160 bpm, and average heart rate of 86 bpm.  Predominant underlying rhythm was sinus rhythm.  One run of non-sustained ventricular tachycardia occurred lasting 6 beats at longest with a max rate of 121 bpm at fastest.  Isolated PACs were rare (<1.0%).  Isolated PVCs were occasional (4.0%).  No evidence of complete heart block or atrial fibrillation.  Triggered and diary events associated with sinus rhythm and PACs.   Rarely symptomatic PACs, occasional PVCs    Recent Labs: No results found for requested labs within last 8760 hours.  Recent Lipid Panel No results found for: CHOL, TRIG, HDL, CHOLHDL, VLDL, LDLCALC, LDLDIRECT  2020 cholesterol Total 242 HDL 59 LDL 162 TGs 104  Risk Assessment/Calculations:     ASCVD 18%+  Physical Exam:    VS:  There were no vitals taken for this visit.    Wt Readings from Last 3 Encounters:  01/14/21 208 lb 9.6 oz (94.6 kg)  11/02/20 211 lb (95.7 kg)  12/30/19 217 lb 4.8 oz (98.6 kg)     GEN: Obese well developed in no acute distress HEENT: Normal NECK: No JVD; No carotid bruits LYMPHATICS: No lymphadenopathy CARDIAC: RRR, no murmurs, rubs, gallops RESPIRATORY:  Clear to auscultation without rales, wheezing or rhonchi  ABDOMEN: Soft, non-tender, non-distended MUSCULOSKELETAL:  No edema; No deformity  SKIN: Warm and dry NEUROLOGIC:  Alert and oriented x 3 PSYCHIATRIC:  Normal affect   ASSESSMENT:    No diagnosis found. PLAN:    In order of problems listed above:  Occasional PVCs Palpitations; possible SVT PACs/PVCs*** - AV Nodal Therapy: *** - AAD: - EP evaluation:    Breast Cancer: DCIS grad 2 ER+, PR+, HER2-, Prior Malignancy but without  Cardiotoxic Chemotherapy HLD Pulmonary embolisum - with Age ?77 y - ASCVD 18%+  - LV Function including GLS or other Strain, 3D Eval, MAPSE, or CMR: not done- will get echocardiogram - Encouraged exercise on a regular basis and healthy dietary habits - Lipids and LFTs today    Medication Adjustments/Labs and Tests Ordered: Current medicines are reviewed at length with the patient today.  Concerns regarding medicines are outlined above.  No orders of the defined types were placed in this encounter.  No orders of the defined types were placed in this encounter.   There are no Patient Instructions on file for this visit.   Signed, Werner Lean, MD  02/04/2021 6:16 PM    [facility]

## 2021-02-06 DIAGNOSIS — I8291 Chronic embolism and thrombosis of unspecified vein: Secondary | ICD-10-CM | POA: Diagnosis not present

## 2021-02-06 DIAGNOSIS — Z7901 Long term (current) use of anticoagulants: Secondary | ICD-10-CM | POA: Diagnosis not present

## 2021-02-08 DIAGNOSIS — Z23 Encounter for immunization: Secondary | ICD-10-CM | POA: Diagnosis not present

## 2021-02-13 ENCOUNTER — Ambulatory Visit: Payer: Medicare Other | Admitting: Internal Medicine

## 2021-02-24 NOTE — Progress Notes (Signed)
HEMATOLOGY-ONCOLOGY TELEPHONE VISIT PROGRESS NOTE  I connected with Brittany [name] on 02/25/2021 at 11:00 AM EDT by telephone and verified that I am speaking with the correct person using two identifiers.  I discussed the limitations, risks, security and privacy concerns of performing an evaluation and management service by telephone and the availability of in person appointments.  I also discussed with the patient that there may be a patient responsible charge related to this service. The patient expressed understanding and agreed to proceed.   History of Present Illness: Brittany [name] is a 75 y.o. female with above-mentioned history of right breast cancer treated with 1 year of neoadjuvant antiestrogen therapy followed by lumpectomy and adjuvant antiestrogen therapy with letrozole. She is currently on oral iron therapy due to low hemoglobin following her breast surgeriesand Warfarin for a history of DVTs and PEs.She presents over the phone today for follow-up.   Oncology History Overview Note  8/   Malignant neoplasm of upper-outer quadrant of right breast in female, estrogen receptor positive ([place])  06/19/2017 Initial Diagnosis   Intermittent bloody nipple discharge in the right breast, mammogram showed asymmetry right breast upper outer quadrant ultrasound negative biopsy 05/26/2017 fibrocystic changes ; breast MRI revealed 5.6 x 3.4 cm non-mass enhancement, MRI guided biopsy revealed: IDC with DCIS, grade 2, ER 100%, PR 15%, Ki-67 5%, T3 N0 stage IIA AJCC 8 clinical stage   07/03/2017 Oncotype testing   Oncotype DX score 18: Risk of recurrence 11%   07/03/2017 -  Anti-estrogen oral therapy   Letrozole 2.5 mg daily neoadjuvant hormone therapy   06/08/2018 Surgery   Right lumpectomy: IDC grade 1, 0.3 cm, intermediate grade DCIS, margins negative, 0/5 lymph nodes negative, T1 a N0 stage Ia   06/12/2018 Surgery   Evacuation of right breast postoperative hematoma most likely due to left  brachial vein DVT on anticoagulation   06/23/2018 Cancer Staging   Staging form: Breast, AJCC 8th Edition - Pathologic: Stage IA (pT1a, pN0, cM0, G1, ER+, PR+, HER2-, Oncotype DX score: 18) - Signed by [name], NP on 10/12/2018     Observations/Objective:     Assessment Plan:  Malignant neoplasm of upper-outer quadrant of right breast in female, estrogen receptor positive ([facility]) Intermittent bloody nipple discharge in the right breast, mammogram showed asymmetry right breast upper outer quadrant ultrasound negative biopsy 05/26/2017 fibrocystic changes ; Breast MRI revealed 5.6 x 3.4 cm non-mass enhancement, MRI guided biopsy revealed: IDC with DCIS, grade 2, ER 100%, PR 15%, Ki-67 5%, T3 N0 stage IIA AJCC 8 clinical stage Right lumpectomy: IDC grade 1, 0.3 cm, intermediate grade DCIS, margins negative, 0/5 lymph nodes negative, T1 a N0 stage Ia  Oncotype DX score18: 11% risk of recurrence  Treatment plan: 1.neoadjuvant hormonal therapy versus chemotherapy9/04/2017-06/05/2018 2.Breast conserving surgery8/13/2019 3. Adjuvant radiation therapy(given the grade one 0.3 cm final tumor size, patient decided not to undergo adjuvant radiation) 4. Adjuvant antiestrogen therapy -------------------------------------------------------------------------- Current treatment:Anastrozole 1 mgdailystarted 06/29/2018 Letrozole toxicities:    The bone pain did not change by stopping anastrozole.  It continues to be a problem even on letrozole.  It is unclear if it is related to antiestrogen therapy. However she is liking the letrozole a lot better because it does not upset her stomach or make her sick. Therefore I sent a 90-day supply of this prescription with 3 refills.  Breast cancer surveillance: Mammogram: 12/10/2018: Benign, breast density category B   Return to clinic in 1 year for follow-up    I  discussed the assessment and treatment plan with the patient. The  patient was provided an opportunity to ask questions and all were answered. The patient agreed with the plan and demonstrated an understanding of the instructions. The patient was advised to call back or seek an in-person evaluation if the symptoms worsen or if the condition fails to improve as anticipated.   Total time spent: 12 mins including non-face to face time and time spent for planning, charting and coordination of care  Rulon Eisenmenger, MD 02/25/2021    I, Cloyde Reams Dorshimer, am acting as scribe for [name], MD.  I have reviewed the above documentation for accuracy and completeness, and I agree with the above.

## 2021-02-24 NOTE — Assessment & Plan Note (Signed)
Intermittent bloody nipple discharge in the right breast, mammogram showed asymmetry right breast upper outer quadrant ultrasound negative biopsy 05/26/2017 fibrocystic changes ; Breast MRI revealed 5.6 x 3.4 cm non-mass enhancement, MRI guided biopsy revealed: IDC with DCIS, grade 2, ER 100%, PR 15%, Ki-67 5%, T3 N0 stage IIA AJCC 8 clinical stage Right lumpectomy: IDC grade 1, 0.3 cm, intermediate grade DCIS, margins negative, 0/5 lymph nodes negative, T1 a N0 stage Ia  Oncotype DX score18: 11% risk of recurrence  Treatment plan: 1.neoadjuvant hormonal therapy versus chemotherapy9/04/2017-06/05/2018 2.Breast conserving surgery8/13/2019 3. Adjuvant radiation therapy(given the grade one 0.3 cm final tumor size, patient decided not to undergo adjuvant radiation) 4. Adjuvant antiestrogen therapy -------------------------------------------------------------------------- Current treatment:Anastrozole 1 mgdailystarted 06/29/2018 Anastrozole toxicities: Severe joint aches and pains I discussed with her about stopping anastrozole for couple of weeks and starting letrozole after that.   I will connect with her in 6 weeks to discuss tolerance to letrozole therapy.  reast cancer surveillance: 1.Breast exam 02/25/21: Benign 2.Mammogram: 12/10/2018: Benign, breast density category B, she will need a new mammogram this year.  Telephone visit in 6 weeks for follow-up to discuss tolerance to letrozole.

## 2021-02-25 ENCOUNTER — Inpatient Hospital Stay: Payer: Medicare Other | Attending: Hematology and Oncology | Admitting: Hematology and Oncology

## 2021-02-25 DIAGNOSIS — C50411 Malignant neoplasm of upper-outer quadrant of right female breast: Secondary | ICD-10-CM

## 2021-02-25 DIAGNOSIS — Z170 Estrogen receptor positive status [ER+]: Secondary | ICD-10-CM

## 2021-02-25 MED ORDER — LETROZOLE 2.5 MG PO TABS: 2.5000 mg | ORAL_TABLET | Freq: Every day | ORAL | 3 refills | Status: DC

## 2021-02-27 ENCOUNTER — Telehealth: Payer: Self-pay | Admitting: Hematology and Oncology

## 2021-02-27 NOTE — Telephone Encounter (Signed)
Sch per 5/2 los, Patient aware.

## 2021-03-08 DIAGNOSIS — Z7901 Long term (current) use of anticoagulants: Secondary | ICD-10-CM | POA: Diagnosis not present

## 2021-03-08 DIAGNOSIS — I2782 Chronic pulmonary embolism: Secondary | ICD-10-CM | POA: Diagnosis not present

## 2021-04-05 DIAGNOSIS — Z7901 Long term (current) use of anticoagulants: Secondary | ICD-10-CM | POA: Diagnosis not present

## 2021-04-05 DIAGNOSIS — I8291 Chronic embolism and thrombosis of unspecified vein: Secondary | ICD-10-CM | POA: Diagnosis not present

## 2021-05-03 DIAGNOSIS — Z7901 Long term (current) use of anticoagulants: Secondary | ICD-10-CM | POA: Diagnosis not present

## 2021-05-03 DIAGNOSIS — I2782 Chronic pulmonary embolism: Secondary | ICD-10-CM | POA: Diagnosis not present

## 2021-05-08 ENCOUNTER — Telehealth: Payer: Self-pay

## 2021-05-08 NOTE — Telephone Encounter (Signed)
Pt called to report potential side effects from medication, Letrozole.   Patient with history of breast cancer, currently taking Letrozole daily.   Patient reports significant joint pain, body aches, and unable close hands fully.  Patient reports hindering ADL's.   RN educated patient to hold medication, Letrozole, X 2 weeks.  Pt will call back to clinic to report if symptoms have subsided.  If symptoms have subsided, MD recommendations for patient to start Exemestane 25mg  daily.

## 2021-05-10 DIAGNOSIS — Z7901 Long term (current) use of anticoagulants: Secondary | ICD-10-CM | POA: Diagnosis not present

## 2021-05-22 ENCOUNTER — Telehealth: Payer: Self-pay

## 2021-05-22 NOTE — Telephone Encounter (Signed)
RN spoke with patient to follow up with holding Letrozole X 2 weeks.   Pt continues with significant pain in joints, body aches, and hands after holding medication.    RN reviewed medications.  RN encouraged used of OTC Voltaren gel, warm compresses, tylenol, and to continue to hold Letrozole until review with MD.     Pt verbalized understanding and agreement.

## 2021-05-27 ENCOUNTER — Telehealth: Payer: Self-pay | Admitting: *Deleted

## 2021-05-27 NOTE — Telephone Encounter (Signed)
LM to continue to hold letrozole for 2 more weeks.

## 2021-06-01 DIAGNOSIS — Z20822 Contact with and (suspected) exposure to covid-19: Secondary | ICD-10-CM | POA: Diagnosis not present

## 2021-06-20 DIAGNOSIS — Z7901 Long term (current) use of anticoagulants: Secondary | ICD-10-CM | POA: Diagnosis not present

## 2021-07-18 DIAGNOSIS — I8291 Chronic embolism and thrombosis of unspecified vein: Secondary | ICD-10-CM | POA: Diagnosis not present

## 2021-07-18 DIAGNOSIS — Z7901 Long term (current) use of anticoagulants: Secondary | ICD-10-CM | POA: Diagnosis not present

## 2021-07-24 ENCOUNTER — Emergency Department (HOSPITAL_BASED_OUTPATIENT_CLINIC_OR_DEPARTMENT_OTHER): Payer: Medicare Other

## 2021-07-24 ENCOUNTER — Emergency Department (HOSPITAL_BASED_OUTPATIENT_CLINIC_OR_DEPARTMENT_OTHER)
Admission: EM | Admit: 2021-07-24 | Discharge: 2021-07-25 | Disposition: A | Discharge status: Home / Self Care | Payer: Medicare Other | Attending: Emergency Medicine | Admitting: Emergency Medicine

## 2021-07-24 ENCOUNTER — Encounter (HOSPITAL_BASED_OUTPATIENT_CLINIC_OR_DEPARTMENT_OTHER): Payer: Self-pay

## 2021-07-24 ENCOUNTER — Other Ambulatory Visit (HOSPITAL_BASED_OUTPATIENT_CLINIC_OR_DEPARTMENT_OTHER): Payer: Self-pay

## 2021-07-24 ENCOUNTER — Other Ambulatory Visit: Payer: Self-pay

## 2021-07-24 DIAGNOSIS — M79662 Pain in left lower leg: Secondary | ICD-10-CM | POA: Diagnosis not present

## 2021-07-24 DIAGNOSIS — M7989 Other specified soft tissue disorders: Secondary | ICD-10-CM | POA: Diagnosis not present

## 2021-07-24 DIAGNOSIS — R600 Localized edema: Secondary | ICD-10-CM | POA: Diagnosis not present

## 2021-07-24 NOTE — ED Provider Notes (Signed)
[facility] Provider Note: Brittany Spurling, MD, FACEP  CSN: [id number] MRN: [id number] ARRIVAL: 07/24/21 at [place]: [id number]/DB010   CHIEF COMPLAINT  Leg Pain   HISTORY OF PRESENT ILLNESS  07/24/21 11:47 PM Brittany [name] is a 75 y.o. female with a history of thromboembolic disease currently on Coumadin.  She has had chronic edema of the left lower leg following trip about a year ago.  She is here because for the past 3 days she has had cramping in the left calf.  She rates the pain as a 5 out of 10, worse with ambulation.  She has been compliant with her Coumadin but is concerned she may have developed a new DVT.   Past Medical History:  Diagnosis Date   Acute deep vein thrombosis (DVT) of brachial vein of left upper extremity (HCC) 06/14/2018   Chronic back pain    History of ectopic pregnancy    History of pulmonary embolism    anticoagulated on coumadin   Personal history of chemotherapy     Past Surgical History:  Procedure Laterality Date   BREAST BIOPSY Right 05/25/2017   malignant   BREAST BIOPSY Right 07/14/2017   BREAST BIOPSY Right 06/22/2017   malignant   BREAST LUMPECTOMY Right 05/2018   BREAST LUMPECTOMY WITH RADIOACTIVE SEED AND SENTINEL LYMPH NODE BIOPSY Right 06/08/2018   Procedure: RIGHT BREAST LUMPECTOMY WITH DOUBLE BRACKETED RADIOACTIVE SEEDS AND SENTINEL LYMPH NODE BIOPSY ERAS PATHWAY;  Surgeon: [name], MD;  Location: [facility];  Service: General;  Laterality: Right;  TAP BLOCK   HIP FRACTURE SURGERY     MINOR BREAST BIOPSY Right 06/13/2018   Procedure: EVACUATION OF RIGHT BREAST HEMATOMA;  Surgeon: [name], MD;  Location: [facility];  Service: General;  Laterality: Right;   TUBAL LIGATION Bilateral     Family History  Problem Relation Age of Onset   Prostate cancer Father    Prostate cancer Brother     Social History   Tobacco Use   Smoking status: Never   Smokeless tobacco: Never  Vaping Use   Vaping  Use: Never used  Substance Use Topics   Alcohol use: No   Drug use: No    Prior to Admission medications   Medication Sig Start Date End Date Taking? Authorizing Provider  acetaminophen (TYLENOL) 500 MG tablet Take 500 mg by mouth every 6 (six) hours as needed for mild pain.     [provider]  Ascorbic Acid (VITAMIN C) 1000 MG tablet Take 1,000 mg by mouth daily.    [provider]  CALCIUM PO Take 600 mg by mouth daily.    [provider]  cholecalciferol (VITAMIN D) 1000 units tablet Take 1,000 Units by mouth daily.     [provider]  docusate sodium (COLACE) 100 MG capsule Take 1 capsule (100 mg total) by mouth 2 (two) times daily. 06/15/18   [name], MD  ferrous sulfate 325 (65 FE) MG tablet Take 1 tablet (325 mg total) by mouth 2 (two) times daily with a meal. 06/15/18   [name], MD  letrozole [facility]) 2.5 MG tablet Take 1 tablet (2.5 mg total) by mouth daily. 02/25/21   [name], MD  MAGNESIUM PO Take 500 mg by mouth daily.    [provider]  warfarin (COUMADIN) 5 MG tablet Take 1 tablet (5 mg total) by mouth daily. Except Tue and Thu Patient taking differently: Take 5 mg by mouth See admin instructions. On  mon, wed, fri, sun 06/25/17   [name], MD  warfarin (COUMADIN) 7.5 MG tablet Take 1 tablet (7.5 mg total) by mouth daily. Tue and Thu Patient taking differently: Take 5-7.5 mg by mouth See admin instructions. On Tues, thur sat 06/25/17   [name], MD    Allergies Patient has no known allergies.   REVIEW OF SYSTEMS  Negative except as noted here or in the History of Present Illness.   PHYSICAL EXAMINATION  Initial Vital Signs Blood pressure (!) 145/70, pulse 92, temperature 97.9 F (36.6 C), temperature source Oral, resp. rate 18, height 5\' 5"  (1.651 m), weight 90.7 kg, SpO2 100 %.  Examination General: Well-developed, well-nourished female in no acute distress; appearance consistent with age of  record HENT: normocephalic; atraumatic Eyes: pupils equal, round and reactive to light; extraocular muscles intact Neck: supple Heart: regular rate and rhythm Lungs: clear to auscultation bilaterally Abdomen: soft; nondistended; nontender; bowel sounds present Extremities: No deformity; full range of motion; nonpitting edema of left lower leg; mild tenderness of left calf Neurologic: Awake, alert and oriented; motor function intact in all extremities and symmetric; no facial droop Skin: Warm and dry Psychiatric: Normal mood and affect   RESULTS  Summary of this visit's results, reviewed and interpreted by myself:   EKG Interpretation  Date/Time:    Ventricular Rate:    PR Interval:    QRS Duration:   QT Interval:    QTC Calculation:   R Axis:     Text Interpretation:         Laboratory Studies: No results found for this or any previous visit (from the past 24 hour(s)). Imaging Studies: US Venous Img Lower Unilateral Left  Result Date: 07/24/2021 CLINICAL DATA:  Left calf pain and edema EXAM: LEFT LOWER EXTREMITY VENOUS DOPPLER ULTRASOUND TECHNIQUE: Gray-scale sonography with compression, as well as color and duplex ultrasound, were performed to evaluate the deep venous system(s) from the level of the common femoral vein through the popliteal and proximal calf veins. COMPARISON:  None. FINDINGS: VENOUS Normal compressibility of the common femoral, superficial femoral, and popliteal veins, as well as the visualized calf veins. Visualized portions of profunda femoral vein and great saphenous vein unremarkable. No filling defects to suggest DVT on grayscale or color Doppler imaging. Doppler waveforms show normal direction of venous flow, normal respiratory plasticity and response to augmentation. Limited views of the contralateral common femoral vein are unremarkable. OTHER None. Limitations: none IMPRESSION: Negative. Electronically Signed   By: Fidela Salisbury M.D.   On: 07/24/2021  22:46    ED COURSE and MDM  Nursing notes, initial and subsequent vitals signs, including pulse oximetry, reviewed and interpreted by myself.  Vitals:   07/24/21 2008 07/24/21 2009  BP: (!) 145/70   Pulse: 92   Resp: 18   Temp: 97.9 F (36.6 C)   TempSrc: Oral   SpO2: 100%   Weight:  90.7 kg  Height:  5\' 5"  (1.651 m)   Medications - No data to display  No evidence of DVT on Doppler.  Patient advised she may benefit from compression stockings.  PROCEDURES  Procedures   ED DIAGNOSES     ICD-10-CM   1. Pain of left calf  M79.662          Shanon Rosser, MD 07/24/21 [phone]

## 2021-07-24 NOTE — ED Triage Notes (Signed)
Pt reports left leg pain and swelling on calf area for 3 days - denies trauma - pt states  she takes  coumadin due to PE.

## 2021-07-24 NOTE — ED Notes (Signed)
Discharged by provider prior to repeat VS

## 2021-08-26 DIAGNOSIS — Z7901 Long term (current) use of anticoagulants: Secondary | ICD-10-CM | POA: Diagnosis not present

## 2021-08-26 DIAGNOSIS — I8291 Chronic embolism and thrombosis of unspecified vein: Secondary | ICD-10-CM | POA: Diagnosis not present

## 2021-09-25 DIAGNOSIS — Z7901 Long term (current) use of anticoagulants: Secondary | ICD-10-CM | POA: Diagnosis not present

## 2021-09-27 DIAGNOSIS — M25551 Pain in right hip: Secondary | ICD-10-CM | POA: Diagnosis not present

## 2021-09-27 DIAGNOSIS — M1611 Unilateral primary osteoarthritis, right hip: Secondary | ICD-10-CM | POA: Diagnosis not present

## 2021-10-25 DIAGNOSIS — R7309 Other abnormal glucose: Secondary | ICD-10-CM | POA: Diagnosis not present

## 2021-10-25 DIAGNOSIS — M1611 Unilateral primary osteoarthritis, right hip: Secondary | ICD-10-CM | POA: Diagnosis not present

## 2021-10-25 DIAGNOSIS — E7800 Pure hypercholesterolemia, unspecified: Secondary | ICD-10-CM | POA: Diagnosis not present

## 2021-10-25 DIAGNOSIS — Z86711 Personal history of pulmonary embolism: Secondary | ICD-10-CM | POA: Diagnosis not present

## 2021-10-25 DIAGNOSIS — Z853 Personal history of malignant neoplasm of breast: Secondary | ICD-10-CM | POA: Diagnosis not present

## 2021-10-25 DIAGNOSIS — Z7901 Long term (current) use of anticoagulants: Secondary | ICD-10-CM | POA: Diagnosis not present

## 2021-11-06 ENCOUNTER — Other Ambulatory Visit: Payer: Self-pay | Admitting: Family Medicine

## 2021-11-06 DIAGNOSIS — Z853 Personal history of malignant neoplasm of breast: Secondary | ICD-10-CM

## 2021-11-11 ENCOUNTER — Ambulatory Visit: Payer: Self-pay | Admitting: Orthopedic Surgery

## 2021-11-11 ENCOUNTER — Telehealth: Payer: Self-pay

## 2021-11-11 ENCOUNTER — Other Ambulatory Visit: Payer: Self-pay

## 2021-11-11 DIAGNOSIS — M1651 Unilateral post-traumatic osteoarthritis, right hip: Secondary | ICD-10-CM

## 2021-11-11 NOTE — Telephone Encounter (Signed)
Request received from Dr. Sid Falcon office for temporary IVC filter insertion prior to right hip replacement surgery on Feb 9. Procedure scheduled for Jan 30. Patient informed and verbalized understanding to all instructions.

## 2021-11-18 ENCOUNTER — Ambulatory Visit: Payer: Self-pay | Admitting: Orthopedic Surgery

## 2021-11-18 NOTE — H&P (Signed)
TOTAL HIP ADMISSION H&P  Patient is admitted for right total hip arthroplasty.  Subjective:  Chief Complaint: right hip pain  HPI: Brittany [name], 76 y.o. female, has a history of pain and functional disability in the right hip(s) due to arthritis and patient has failed non-surgical conservative treatments for greater than 12 weeks to include NSAID's and/or analgesics, flexibility and strengthening excercises, use of assistive devices, weight reduction as appropriate, and activity modification.  Onset of symptoms was gradual starting >10 years ago with gradually worsening course since that time.The patient noted prior procedures of the hip to include ORIF w subsequent HW removal  on the right hip(s).  Patient currently rates pain in the right hip at 10 out of 10 with activity. Patient has night pain, worsening of pain with activity and weight bearing, trendelenberg gait, pain that interfers with activities of daily living, and pain with passive range of motion. Patient has evidence of subchondral cysts, subchondral sclerosis, periarticular osteophytes, joint space narrowing, and broken screw proximal femur  by imaging studies. This condition presents safety issues increasing the risk of falls. This patient has had proximal femur fracture.  There is no current active infection.  Patient Active Problem List   Diagnosis Date Noted   Palpitations 11/02/2020   PVC (premature ventricular contraction) 11/02/2020   Pulmonary embolism ([place]) 11/02/2020   Acute deep vein thrombosis (DVT) of brachial vein of left upper extremity ([place]) 06/14/2018   Breast hematoma 06/12/2018   Malignant neoplasm of upper-outer quadrant of right breast in female, estrogen receptor positive ([place]) 06/25/2017   Past Medical History:  Diagnosis Date   Acute deep vein thrombosis (DVT) of brachial vein of left upper extremity ([place]) 06/14/2018   Chronic back pain    History of ectopic pregnancy    History of pulmonary embolism     anticoagulated on coumadin   Personal history of chemotherapy     Past Surgical History:  Procedure Laterality Date   BREAST BIOPSY Right 05/25/2017   malignant   BREAST BIOPSY Right 07/14/2017   BREAST BIOPSY Right 06/22/2017   malignant   BREAST LUMPECTOMY Right 05/2018   BREAST LUMPECTOMY WITH RADIOACTIVE SEED AND SENTINEL LYMPH NODE BIOPSY Right 06/08/2018   Procedure: RIGHT BREAST LUMPECTOMY WITH DOUBLE BRACKETED RADIOACTIVE SEEDS AND SENTINEL LYMPH NODE BIOPSY ERAS PATHWAY;  Surgeon: [name], MD;  Location: [facility];  Service: General;  Laterality: Right;  TAP BLOCK   HIP FRACTURE SURGERY     MINOR BREAST BIOPSY Right 06/13/2018   Procedure: EVACUATION OF RIGHT BREAST HEMATOMA;  Surgeon: [name], MD;  Location: [facility];  Service: General;  Laterality: Right;   TUBAL LIGATION Bilateral     Current Outpatient Medications  Medication Sig Dispense Refill Last Dose   acetaminophen (TYLENOL) 500 MG tablet Take 500 mg by mouth every 6 (six) hours as needed for mild pain.       Ascorbic Acid (VITAMIN C) 1000 MG tablet Take 1,000 mg by mouth daily.      CALCIUM PO Take 600 mg by mouth daily.      cholecalciferol (VITAMIN D) 1000 units tablet Take 1,000 Units by mouth daily.       docusate sodium (COLACE) 100 MG capsule Take 1 capsule (100 mg total) by mouth 2 (two) times daily. 20 capsule 2    ferrous sulfate 325 (65 FE) MG tablet Take 1 tablet (325 mg total) by mouth 2 (two) times daily with a meal. 60 tablet 3  letrozole (FEMARA) 2.5 MG tablet Take 1 tablet (2.5 mg total) by mouth daily. 90 tablet 3    MAGNESIUM PO Take 500 mg by mouth daily.      warfarin (COUMADIN) 5 MG tablet Take 1 tablet (5 mg total) by mouth daily. Except Tue and Thu (Patient taking differently: Take 5 mg by mouth See admin instructions. On mon, wed, fri, sun)      warfarin (COUMADIN) 7.5 MG tablet Take 1 tablet (7.5 mg total) by mouth daily. Tue and Thu (Patient taking  differently: Take 5-7.5 mg by mouth See admin instructions. On Tues, thur sat)      No current facility-administered medications for this visit.   No Known Allergies  Social History   Tobacco Use   Smoking status: Never   Smokeless tobacco: Never  Substance Use Topics   Alcohol use: No    Family History  Problem Relation Age of Onset   Prostate cancer Father    Prostate cancer Brother      Review of Systems  Musculoskeletal:  Positive for arthralgias and gait problem.  All other systems reviewed and are negative.  Objective:  Physical Exam Constitutional:      Appearance: Normal appearance.  HENT:     Head: Normocephalic and atraumatic.     Right Ear: External ear normal.     Left Ear: External ear normal.     Nose: Nose normal.     Mouth/Throat:     Mouth: Mucous membranes are dry.     Pharynx: Oropharynx is clear.  Eyes:     Extraocular Movements: Extraocular movements intact.     Conjunctiva/sclera: Conjunctivae normal.     Pupils: Pupils are equal, round, and reactive to light.  Cardiovascular:     Rate and Rhythm: Normal rate and regular rhythm.     Pulses: Normal pulses.  Pulmonary:     Effort: Pulmonary effort is normal.  Abdominal:     General: Abdomen is flat.     Palpations: Abdomen is soft.  Genitourinary:    Comments: deferred Musculoskeletal:     Cervical back: Normal range of motion and neck supple.     Right hip: Bony tenderness present. Decreased range of motion.       Legs:  Skin:    General: Skin is warm and dry.     Capillary Refill: Capillary refill takes less than 2 seconds.  Neurological:     General: No focal deficit present.     Mental Status: She is alert and oriented to person, place, and time.  Psychiatric:        Mood and Affect: Mood normal.        Behavior: Behavior normal.        Thought Content: Thought content normal.        Judgment: Judgment normal.    Vital signs in last 24  hours: @VSRANGES @  Labs:   Estimated body mass index is 33.28 kg/m as calculated from the following:   Height as of 07/24/21: 5\' 5"  (1.651 m).   Weight as of 07/24/21: 90.7 kg.   Imaging Review Plain radiographs demonstrate severe degenerative joint disease of the right hip(s). The bone quality appears to be adequate for age and reported activity level.      Assessment/Plan:  End stage arthritis post-traumatic, right hip(s)  The patient history, physical examination, clinical judgement of the provider and imaging studies are consistent with end stage degenerative joint disease of the right hip(s) and total hip  arthroplasty is deemed medically necessary. The treatment options including medical management, injection therapy, arthroscopy and arthroplasty were discussed at length. The risks and benefits of total hip arthroplasty were presented and reviewed. The risks due to aseptic loosening, infection, stiffness, dislocation/subluxation,  thromboembolic complications and other imponderables were discussed.  The patient acknowledged the explanation, agreed to proceed with the plan and consent was signed. Patient is being admitted for inpatient treatment for surgery, pain control, PT, OT, prophylactic antibiotics, VTE prophylaxis, progressive ambulation and ADL's and discharge planning.The patient is planning to be discharged  home with HEP   Anticipated LOS equal to or greater than 2 midnights due to - Age 29 and older with one or more of the following:  - Obesity  - Expected need for hospital services (PT, OT, Nursing) required for safe  discharge  - Anticipated need for postoperative skilled nursing care or inpatient rehab  - Active co-morbidities: DVT/VTE OR   - Unanticipated findings during/Post Surgery: DVT/VTE  - Patient is a high risk of re-admission due to: None

## 2021-11-18 NOTE — H&P (View-Only) (Signed)
TOTAL HIP ADMISSION H&P  Patient is admitted for right total hip arthroplasty.  Subjective:  Chief Complaint: right hip pain  HPI: Brittany [name], 76 y.o. female, has a history of pain and functional disability in the right hip(s) due to arthritis and patient has failed non-surgical conservative treatments for greater than 12 weeks to include NSAID's and/or analgesics, flexibility and strengthening excercises, use of assistive devices, weight reduction as appropriate, and activity modification.  Onset of symptoms was gradual starting >10 years ago with gradually worsening course since that time.The patient noted prior procedures of the hip to include ORIF w subsequent HW removal  on the right hip(s).  Patient currently rates pain in the right hip at 10 out of 10 with activity. Patient has night pain, worsening of pain with activity and weight bearing, trendelenberg gait, pain that interfers with activities of daily living, and pain with passive range of motion. Patient has evidence of subchondral cysts, subchondral sclerosis, periarticular osteophytes, joint space narrowing, and broken screw proximal femur  by imaging studies. This condition presents safety issues increasing the risk of falls. This patient has had proximal femur fracture.  There is no current active infection.  Patient Active Problem List   Diagnosis Date Noted   Palpitations 11/02/2020   PVC (premature ventricular contraction) 11/02/2020   Pulmonary embolism ([place]) 11/02/2020   Acute deep vein thrombosis (DVT) of brachial vein of left upper extremity ([place]) 06/14/2018   Breast hematoma 06/12/2018   Malignant neoplasm of upper-outer quadrant of right breast in female, estrogen receptor positive ([place]) 06/25/2017   Past Medical History:  Diagnosis Date   Acute deep vein thrombosis (DVT) of brachial vein of left upper extremity ([place]) 06/14/2018   Chronic back pain    History of ectopic pregnancy    History of pulmonary embolism     anticoagulated on coumadin   Personal history of chemotherapy     Past Surgical History:  Procedure Laterality Date   BREAST BIOPSY Right 05/25/2017   malignant   BREAST BIOPSY Right 07/14/2017   BREAST BIOPSY Right 06/22/2017   malignant   BREAST LUMPECTOMY Right 05/2018   BREAST LUMPECTOMY WITH RADIOACTIVE SEED AND SENTINEL LYMPH NODE BIOPSY Right 06/08/2018   Procedure: RIGHT BREAST LUMPECTOMY WITH DOUBLE BRACKETED RADIOACTIVE SEEDS AND SENTINEL LYMPH NODE BIOPSY ERAS PATHWAY;  Surgeon: [name], MD;  Location: [facility];  Service: General;  Laterality: Right;  TAP BLOCK   HIP FRACTURE SURGERY     MINOR BREAST BIOPSY Right 06/13/2018   Procedure: EVACUATION OF RIGHT BREAST HEMATOMA;  Surgeon: [name], MD;  Location: [facility];  Service: General;  Laterality: Right;   TUBAL LIGATION Bilateral     Current Outpatient Medications  Medication Sig Dispense Refill Last Dose   acetaminophen (TYLENOL) 500 MG tablet Take 500 mg by mouth every 6 (six) hours as needed for mild pain.       Ascorbic Acid (VITAMIN C) 1000 MG tablet Take 1,000 mg by mouth daily.      CALCIUM PO Take 600 mg by mouth daily.      cholecalciferol (VITAMIN D) 1000 units tablet Take 1,000 Units by mouth daily.       docusate sodium (COLACE) 100 MG capsule Take 1 capsule (100 mg total) by mouth 2 (two) times daily. 20 capsule 2    ferrous sulfate 325 (65 FE) MG tablet Take 1 tablet (325 mg total) by mouth 2 (two) times daily with a meal. 60 tablet 3  letrozole (FEMARA) 2.5 MG tablet Take 1 tablet (2.5 mg total) by mouth daily. 90 tablet 3    MAGNESIUM PO Take 500 mg by mouth daily.      warfarin (COUMADIN) 5 MG tablet Take 1 tablet (5 mg total) by mouth daily. Except Tue and Thu (Patient taking differently: Take 5 mg by mouth See admin instructions. On mon, wed, fri, sun)      warfarin (COUMADIN) 7.5 MG tablet Take 1 tablet (7.5 mg total) by mouth daily. Tue and Thu (Patient taking  differently: Take 5-7.5 mg by mouth See admin instructions. On Tues, thur sat)      No current facility-administered medications for this visit.   No Known Allergies  Social History   Tobacco Use   Smoking status: Never   Smokeless tobacco: Never  Substance Use Topics   Alcohol use: No    Family History  Problem Relation Age of Onset   Prostate cancer Father    Prostate cancer Brother      Review of Systems  Musculoskeletal:  Positive for arthralgias and gait problem.  All other systems reviewed and are negative.  Objective:  Physical Exam Constitutional:      Appearance: Normal appearance.  HENT:     Head: Normocephalic and atraumatic.     Right Ear: External ear normal.     Left Ear: External ear normal.     Nose: Nose normal.     Mouth/Throat:     Mouth: Mucous membranes are dry.     Pharynx: Oropharynx is clear.  Eyes:     Extraocular Movements: Extraocular movements intact.     Conjunctiva/sclera: Conjunctivae normal.     Pupils: Pupils are equal, round, and reactive to light.  Cardiovascular:     Rate and Rhythm: Normal rate and regular rhythm.     Pulses: Normal pulses.  Pulmonary:     Effort: Pulmonary effort is normal.  Abdominal:     General: Abdomen is flat.     Palpations: Abdomen is soft.  Genitourinary:    Comments: deferred Musculoskeletal:     Cervical back: Normal range of motion and neck supple.     Right hip: Bony tenderness present. Decreased range of motion.       Legs:  Skin:    General: Skin is warm and dry.     Capillary Refill: Capillary refill takes less than 2 seconds.  Neurological:     General: No focal deficit present.     Mental Status: She is alert and oriented to person, place, and time.  Psychiatric:        Mood and Affect: Mood normal.        Behavior: Behavior normal.        Thought Content: Thought content normal.        Judgment: Judgment normal.    Vital signs in last 24  hours: @VSRANGES @  Labs:   Estimated body mass index is 33.28 kg/m as calculated from the following:   Height as of 07/24/21: 5\' 5"  (1.651 m).   Weight as of 07/24/21: 90.7 kg.   Imaging Review Plain radiographs demonstrate severe degenerative joint disease of the right hip(s). The bone quality appears to be adequate for age and reported activity level.      Assessment/Plan:  End stage arthritis post-traumatic, right hip(s)  The patient history, physical examination, clinical judgement of the provider and imaging studies are consistent with end stage degenerative joint disease of the right hip(s) and total hip  arthroplasty is deemed medically necessary. The treatment options including medical management, injection therapy, arthroscopy and arthroplasty were discussed at length. The risks and benefits of total hip arthroplasty were presented and reviewed. The risks due to aseptic loosening, infection, stiffness, dislocation/subluxation,  thromboembolic complications and other imponderables were discussed.  The patient acknowledged the explanation, agreed to proceed with the plan and consent was signed. Patient is being admitted for inpatient treatment for surgery, pain control, PT, OT, prophylactic antibiotics, VTE prophylaxis, progressive ambulation and ADL's and discharge planning.The patient is planning to be discharged  home with HEP   Anticipated LOS equal to or greater than 2 midnights due to - Age 52 and older with one or more of the following:  - Obesity  - Expected need for hospital services (PT, OT, Nursing) required for safe  discharge  - Anticipated need for postoperative skilled nursing care or inpatient rehab  - Active co-morbidities: DVT/VTE OR   - Unanticipated findings during/Post Surgery: DVT/VTE  - Patient is a high risk of re-admission due to: None

## 2021-11-19 ENCOUNTER — Other Ambulatory Visit: Payer: Self-pay | Admitting: Orthopedic Surgery

## 2021-11-19 DIAGNOSIS — M25551 Pain in right hip: Secondary | ICD-10-CM

## 2021-11-21 NOTE — Patient Instructions (Addendum)
DUE TO COVID-19 ONLY ONE VISITOR IS ALLOWED TO COME WITH YOU AND STAY IN THE WAITING ROOM ONLY DURING PRE OP AND PROCEDURE DAY OF SURGERY IF YOU ARE GOING HOME AFTER SURGERY. IF YOU ARE SPENDING THE NIGHT 2 PEOPLE MAY VISIT WITH YOU IN YOUR PRIVATE ROOM AFTER SURGERY UNTIL VISITING  HOURS ARE OVER AT 800 PM AND 1  VISITOR  MAY  SPEND THE NIGHT.   YOU NEED TO HAVE A COVID 19 TEST ON__2/7/23_____ @__9 :45_____, THIS TEST MUST BE DONE BEFORE SURGERY,                 Brittany [name]     Your procedure is scheduled on: 12/05/21   Report to [facility] Main  Entrance   Report to admitting at   7:45 AM     Call this number if you have problems the morning of surgery [phone]   No food after midnight.    You may have clear liquid until 7:30 AM.    At 7:15 AM drink pre surgery drink.   Nothing by mouth after 7:30 AM.    CLEAR LIQUID DIET   Foods Allowed                                                                     Foods Excluded  Coffee and tea, regular and decaf                             liquids that you cannot  Plain Jell-O any favor except red or purple                                           see through such as: Fruit ices (not with fruit pulp)                                     milk, soups, orange juice  Iced Popsicles                                    All solid food Carbonated beverages, regular and diet                                    Cranberry, grape and apple juices Sports drinks like Gatorade Lightly seasoned clear broth or consume(fat free) Sugar    BRUSH YOUR TEETH MORNING OF SURGERY AND RINSE YOUR MOUTH OUT, NO CHEWING GUM CANDY OR MINTS.     Take these medicines the morning of surgery with A SIP OF WATER: none  Stop taking _Coumadin__________on ___2/4_______as instructed by _Dr. Harris____________.  Start taking Lovenox bride on 2/5                                  You may not have any metal  on your body including hair pins and               piercings  Do not wear jewelry, make-up, lotions, powders or perfumes, deodorant             Do not wear nail polish on your fingernails.  Do not shave  48 hours prior to surgery.                 Do not bring valuables to the hospital. [name].  Contacts, dentures or bridgework may not be worn into surgery.  Leave suitcase in the car. After surgery it may be brought to your room.                  [place] - Preparing for Surgery Before surgery, you can play an important role.  Because skin is not sterile, your skin needs to be as free of germs as possible.  You can reduce the number of germs on your skin by washing with CHG (chlorahexidine gluconate) soap before surgery.  CHG is an antiseptic cleaner which kills germs and bonds with the skin to continue killing germs even after washing. Please DO NOT use if you have an allergy to CHG or antibacterial soaps.  If your skin becomes reddened/irritated stop using the CHG and inform your nurse when you arrive at Short Stay. Do not shave (including legs and underarms) for at least 48 hours prior to the first CHG shower.   Please follow these instructions carefully:  1.  Shower with CHG Soap the night before surgery and the  morning of Surgery.  2.  If you choose to wash your hair, wash your hair first as usual with your  normal  shampoo.  3.  After you shampoo, rinse your hair and body thoroughly to remove the  shampoo.                            4.  Use CHG as you would any other liquid soap.  You can apply chg directly  to the skin and wash                       Gently with a scrungie or clean washcloth.  5.  Apply the CHG Soap to your body ONLY FROM THE NECK DOWN.   Do not use on face/ open                           Wound or open sores. Avoid contact with eyes, ears mouth and genitals (private parts).                       Wash face,  Genitals (private parts) with your normal soap.              6.  Wash thoroughly, paying special attention to the area where your surgery  will be performed.  7.  Thoroughly rinse your body with warm water from the neck down.  8.  DO NOT shower/wash with your normal soap after using and rinsing off  the CHG Soap.                9.  Pat yourself dry with a clean towel.  10.  Wear clean pajamas.            11.  Place clean sheets on your bed the night of your first shower and do not  sleep with pets. Day of Surgery : Do not apply any lotions/deodorants the morning of surgery.  Please wear clean clothes to the hospital/surgery center.  FAILURE TO FOLLOW THESE INSTRUCTIONS MAY RESULT IN THE CANCELLATION OF YOUR SURGERY   ________________________________________________________________________   Incentive Spirometer  An incentive spirometer is a tool that can help keep your lungs clear and active. This tool measures how well you are filling your lungs with each breath. Taking long deep breaths may help reverse or decrease the chance of developing breathing (pulmonary) problems (especially infection) following: A long period of time when you are unable to move or be active. BEFORE THE PROCEDURE  If the spirometer includes an indicator to show your best effort, your nurse or respiratory therapist will set it to a desired goal. If possible, sit up straight or lean slightly forward. Try not to slouch. Hold the incentive spirometer in an upright position. INSTRUCTIONS FOR USE  Sit on the edge of your bed if possible, or sit up as far as you can in bed or on a chair. Hold the incentive spirometer in an upright position. Breathe out normally. Place the mouthpiece in your mouth and seal your lips tightly around it. Breathe in slowly and as deeply as possible, raising the piston or the ball toward the top of the column. Hold your breath for 3-5 seconds or for as long as possible. Allow the piston or ball to fall to the bottom of the  column. Remove the mouthpiece from your mouth and breathe out normally. Rest for a few seconds and repeat Steps 1 through 7 at least 10 times every 1-2 hours when you are awake. Take your time and take a few normal breaths between deep breaths. The spirometer may include an indicator to show your best effort. Use the indicator as a goal to work toward during each repetition. After each set of 10 deep breaths, practice coughing to be sure your lungs are clear. If you have an incision (the cut made at the time of surgery), support your incision when coughing by placing a pillow or rolled up towels firmly against it. Once you are able to get out of bed, walk around indoors and cough well. You may stop using the incentive spirometer when instructed by your caregiver.  RISKS AND COMPLICATIONS Take your time so you do not get dizzy or light-headed. If you are in pain, you may need to take or ask for pain medication before doing incentive spirometry. It is harder to take a deep breath if you are having pain. AFTER USE Rest and breathe slowly and easily. It can be helpful to keep track of a log of your progress. Your caregiver can provide you with a simple table to help with this. If you are using the spirometer at home, follow these instructions: [place] IF:  You are having difficultly using the spirometer. You have trouble using the spirometer as often as instructed. Your pain medication is not giving enough relief while using the spirometer. You develop fever of 100.5 F (38.1 C) or higher. SEEK IMMEDIATE MEDICAL CARE IF:  You cough up bloody sputum that had not been present before. You develop fever of 102 F (38.9 C) or greater. You develop worsening pain at or near the incision site. MAKE  SURE YOU:  Understand these instructions. Will watch your condition. Will get help right away if you are not doing well or get worse. Document Released: 02/23/2007 Document Revised: 01/05/2012  Document Reviewed: 04/26/2007 [facility] Patient Information 2014 [employer], [place].   ________________________________________________________________________ pcr

## 2021-11-22 ENCOUNTER — Encounter (HOSPITAL_COMMUNITY)
Admission: RE | Admit: 2021-11-22 | Discharge: 2021-11-22 | Disposition: A | Discharge status: Home / Self Care | Payer: Medicare Other | Source: Ambulatory Visit | Attending: Orthopedic Surgery | Admitting: Orthopedic Surgery

## 2021-11-22 ENCOUNTER — Encounter (HOSPITAL_COMMUNITY): Payer: Self-pay

## 2021-11-22 ENCOUNTER — Other Ambulatory Visit: Payer: Self-pay

## 2021-11-22 VITALS — BP 136/63 | HR 84 | Temp 97.6°F | Resp 18 | Wt 194.4 lb

## 2021-11-22 DIAGNOSIS — Z01818 Encounter for other preprocedural examination: Secondary | ICD-10-CM

## 2021-11-22 DIAGNOSIS — Z7901 Long term (current) use of anticoagulants: Secondary | ICD-10-CM | POA: Diagnosis not present

## 2021-11-22 DIAGNOSIS — Z01812 Encounter for preprocedural laboratory examination: Secondary | ICD-10-CM | POA: Diagnosis not present

## 2021-11-22 DIAGNOSIS — M1651 Unilateral post-traumatic osteoarthritis, right hip: Secondary | ICD-10-CM

## 2021-11-22 HISTORY — DX: Malignant (primary) neoplasm, unspecified: C80.1

## 2021-11-22 LAB — CBC
HCT: 38.4 % (ref 36.0–46.0)
Hemoglobin: 11.8 g/dL — ABNORMAL LOW (ref 12.0–15.0)
MCH: 25.4 pg — ABNORMAL LOW (ref 26.0–34.0)
MCHC: 30.7 g/dL (ref 30.0–36.0)
MCV: 82.8 fL (ref 80.0–100.0)
Platelets: 252 10*3/uL (ref 150–400)
RBC: 4.64 MIL/uL (ref 3.87–5.11)
RDW: 14.6 % (ref 11.5–15.5)
WBC: 6.8 10*3/uL (ref 4.0–10.5)
nRBC: 0 % (ref 0.0–0.2)

## 2021-11-22 LAB — TYPE AND SCREEN
ABO/RH(D): A POS
Antibody Screen: NEGATIVE

## 2021-11-22 LAB — SURGICAL PCR SCREEN
MRSA, PCR: NEGATIVE
Staphylococcus aureus: NEGATIVE

## 2021-11-22 NOTE — Progress Notes (Signed)
COVID test- 2/7/at 9:45  PCP - Dr. Rogers Blocker Cardiologist - Dr. Gasper Sells  Chest x-ray - no EKG - 11/02/21-epic Stress Test - no ECHO - no Cardiac Cath - NA Pacemaker/ICD device last checked:NA  Sleep Study - no CPAP -   Fasting Blood Sugar - NA Checks Blood Sugar _____ times a day  Blood Thinner Instructions:Coumadin/ Dr. Kenton Kingfisher Aspirin Instructions:Stop on 2/4 and start lovenox on 2/5/ Dr. Kenton Kingfisher Last Dose:2/4  Anesthesia review: yes  Patient denies shortness of breath, fever, cough and chest pain at PAT appointment  Pt will have an IVP filter placed on 11/25/21 for her history ov PE and DVT  Patient verbalized understanding of instructions that were given to them at the PAT appointment. Patient was also instructed that they will need to review over the PAT instructions again at home before surgery. yes

## 2021-11-25 ENCOUNTER — Encounter (HOSPITAL_COMMUNITY): Payer: Self-pay | Admitting: Vascular Surgery

## 2021-11-25 ENCOUNTER — Ambulatory Visit (HOSPITAL_COMMUNITY)
Admission: RE | Admit: 2021-11-25 | Discharge: 2021-11-25 | Disposition: A | Discharge status: Home / Self Care | Payer: Medicare Other | Source: Ambulatory Visit | Attending: Vascular Surgery | Admitting: Vascular Surgery

## 2021-11-25 ENCOUNTER — Other Ambulatory Visit: Payer: Self-pay

## 2021-11-25 ENCOUNTER — Encounter (HOSPITAL_COMMUNITY)
Admission: RE | Disposition: A | Discharge status: Home / Self Care | Payer: Self-pay | Source: Ambulatory Visit | Attending: Vascular Surgery

## 2021-11-25 DIAGNOSIS — Z408 Encounter for other prophylactic surgery: Secondary | ICD-10-CM | POA: Insufficient documentation

## 2021-11-25 DIAGNOSIS — Z86718 Personal history of other venous thrombosis and embolism: Secondary | ICD-10-CM | POA: Diagnosis not present

## 2021-11-25 DIAGNOSIS — Z7901 Long term (current) use of anticoagulants: Secondary | ICD-10-CM | POA: Diagnosis not present

## 2021-11-25 DIAGNOSIS — Z86711 Personal history of pulmonary embolism: Secondary | ICD-10-CM | POA: Insufficient documentation

## 2021-11-25 DIAGNOSIS — M25551 Pain in right hip: Secondary | ICD-10-CM | POA: Diagnosis not present

## 2021-11-25 HISTORY — PX: IVC FILTER INSERTION: CATH118245

## 2021-11-25 LAB — POCT I-STAT, CHEM 8
BUN: 14 mg/dL (ref 8–23)
Calcium, Ion: 1.18 mmol/L (ref 1.15–1.40)
Chloride: 110 mmol/L (ref 98–111)
Creatinine, Ser: 0.6 mg/dL (ref 0.44–1.00)
Glucose, Bld: 89 mg/dL (ref 70–99)
HCT: 35 % — ABNORMAL LOW (ref 36.0–46.0)
Hemoglobin: 11.9 g/dL — ABNORMAL LOW (ref 12.0–15.0)
Potassium: 3.8 mmol/L (ref 3.5–5.1)
Sodium: 142 mmol/L (ref 135–145)
TCO2: 27 mmol/L (ref 22–32)

## 2021-11-25 LAB — PROTIME-INR
INR: 2.2 — ABNORMAL HIGH (ref 0.8–1.2)
Prothrombin Time: 24.6 seconds — ABNORMAL HIGH (ref 11.4–15.2)

## 2021-11-25 SURGERY — IVC FILTER INSERTION
Anesthesia: LOCAL

## 2021-11-25 MED ORDER — ACETAMINOPHEN 325 MG PO TABS: 650.0000 mg | ORAL_TABLET | ORAL | Status: DC | PRN

## 2021-11-25 MED ORDER — SODIUM CHLORIDE 0.9 % IV SOLN: INTRAVENOUS | Status: DC

## 2021-11-25 MED ORDER — HYDRALAZINE HCL 20 MG/ML IJ SOLN: 5.0000 mg | INTRAMUSCULAR | Status: DC | PRN

## 2021-11-25 MED ORDER — LIDOCAINE HCL (PF) 1 % IJ SOLN
INTRAMUSCULAR | Status: AC
  Filled 2021-11-25: qty 30

## 2021-11-25 MED ORDER — LABETALOL HCL 5 MG/ML IV SOLN: 10.0000 mg | INTRAVENOUS | Status: DC | PRN

## 2021-11-25 MED ORDER — FENTANYL CITRATE (PF) 100 MCG/2ML IJ SOLN
INTRAMUSCULAR | Status: DC | PRN
  Administered 2021-11-25: 25 ug via INTRAVENOUS

## 2021-11-25 MED ORDER — SODIUM CHLORIDE 0.9 % IV SOLN: 250.0000 mL | INTRAVENOUS | Status: DC | PRN

## 2021-11-25 MED ORDER — HEPARIN (PORCINE) IN NACL 1000-0.9 UT/500ML-% IV SOLN
INTRAVENOUS | Status: DC | PRN
  Administered 2021-11-25: 500 mL

## 2021-11-25 MED ORDER — FENTANYL CITRATE (PF) 100 MCG/2ML IJ SOLN
INTRAMUSCULAR | Status: AC
  Filled 2021-11-25: qty 2

## 2021-11-25 MED ORDER — MIDAZOLAM HCL 2 MG/2ML IJ SOLN
INTRAMUSCULAR | Status: DC | PRN
  Administered 2021-11-25: 1 mg via INTRAVENOUS

## 2021-11-25 MED ORDER — IODIXANOL 320 MG/ML IV SOLN
INTRAVENOUS | Status: DC | PRN
  Administered 2021-11-25: 35 mL via INTRAVENOUS

## 2021-11-25 MED ORDER — MIDAZOLAM HCL 2 MG/2ML IJ SOLN
INTRAMUSCULAR | Status: AC
  Filled 2021-11-25: qty 2

## 2021-11-25 MED ORDER — SODIUM CHLORIDE 0.9% FLUSH: 3.0000 mL | INTRAVENOUS | Status: DC | PRN

## 2021-11-25 MED ORDER — LIDOCAINE HCL (PF) 1 % IJ SOLN
INTRAMUSCULAR | Status: DC | PRN
  Administered 2021-11-25: 15 mL via INTRADERMAL

## 2021-11-25 MED ORDER — ONDANSETRON HCL 4 MG/2ML IJ SOLN: 4.0000 mg | Freq: Four times a day (QID) | INTRAMUSCULAR | Status: DC | PRN

## 2021-11-25 MED ORDER — SODIUM CHLORIDE 0.9% FLUSH: 3.0000 mL | Freq: Two times a day (BID) | INTRAVENOUS | Status: DC

## 2021-11-25 SURGICAL SUPPLY — 12 items
BAG SNAP BAND KOVER 36X36 (MISCELLANEOUS) ×1 IMPLANT
COVER DOME SNAP 22 D (MISCELLANEOUS) ×1 IMPLANT
FILTER VC CELECT-FEMORAL (Filter) ×1 IMPLANT
KIT MICROPUNCTURE NIT STIFF (SHEATH) ×1 IMPLANT
KIT PV (KITS) ×3 IMPLANT
PROTECTION STATION PRESSURIZED (MISCELLANEOUS) ×2
SHEATH PROBE COVER 6X72 (BAG) ×1 IMPLANT
STATION PROTECTION PRESSURIZED (MISCELLANEOUS) IMPLANT
SYR MEDRAD MARK V 150ML (SYRINGE) ×1 IMPLANT
TRAY PV CATH (CUSTOM PROCEDURE TRAY) ×3 IMPLANT
TUBING CONTRAST HIGH PRESS 48 (TUBING) ×1 IMPLANT
WIRE BENTSON .035X145CM (WIRE) ×1 IMPLANT

## 2021-11-25 NOTE — Progress Notes (Signed)
Discharge one hour post procedure

## 2021-11-25 NOTE — H&P (Signed)
H+P  History of Present Illness: This is a 76 y.o. female with at least 2 prior pe now on chronic coumadin. Plan for R tha on 2/9. Here today for ivc filter placement. No previous filters.  Past Medical History:  Diagnosis Date   Acute deep vein thrombosis (DVT) of brachial vein of left upper extremity (HCC) 06/14/2018   Cancer (HCC)    Chronic back pain    History of ectopic pregnancy    History of pulmonary embolism    anticoagulated on coumadin   Personal history of chemotherapy     Past Surgical History:  Procedure Laterality Date   BREAST BIOPSY Right 05/25/2017   malignant   BREAST BIOPSY Right 07/14/2017   BREAST BIOPSY Right 06/22/2017   malignant   BREAST LUMPECTOMY Right 05/2018   BREAST LUMPECTOMY WITH RADIOACTIVE SEED AND SENTINEL LYMPH NODE BIOPSY Right 06/08/2018   Procedure: RIGHT BREAST LUMPECTOMY WITH DOUBLE BRACKETED RADIOACTIVE SEEDS AND SENTINEL LYMPH NODE BIOPSY ERAS PATHWAY;  Surgeon: [name], MD;  Location: [place];  Service: General;  Laterality: Right;  TAP BLOCK   HIP FRACTURE SURGERY     MINOR BREAST BIOPSY Right 06/13/2018   Procedure: EVACUATION OF RIGHT BREAST HEMATOMA;  Surgeon: [name], MD;  Location: [place];  Service: General;  Laterality: Right;   TUBAL LIGATION Bilateral     No Known Allergies  Prior to Admission medications   Medication Sig Start Date End Date Taking? Authorizing Provider  Ascorbic Acid (VITAMIN C) 1000 MG tablet Take 1,000 mg by mouth daily.   Yes [provider]  CALCIUM PO Take 600 mg by mouth daily.   Yes [provider]  cholecalciferol (VITAMIN D) 1000 units tablet Take 1,000 Units by mouth daily.    Yes [provider]  docusate sodium (COLACE) 100 MG capsule Take 1 capsule (100 mg total) by mouth 2 (two) times daily. Patient taking differently: Take 100 mg by mouth daily. 06/15/18  Yes [name], MD  ferrous sulfate 325 (65 FE) MG tablet Take 1 tablet  (325 mg total) by mouth 2 (two) times daily with a meal. Patient taking differently: Take 325 mg by mouth daily. 06/15/18  Yes [name], MD  MAGNESIUM PO Take 500 mg by mouth daily.   Yes [provider]  warfarin (COUMADIN) 5 MG tablet Take 1 tablet (5 mg total) by mouth daily. Except Tue and Thu Patient taking differently: Take 5 mg by mouth See admin instructions. On mon, wed, fri, sun 06/25/17  Yes [name], MD  warfarin (COUMADIN) 7.5 MG tablet Take 1 tablet (7.5 mg total) by mouth daily. Tue and Thu Patient taking differently: Take 7.5 mg by mouth See admin instructions. On Tues, thur sat 06/25/17  Yes [name], MD  acetaminophen (TYLENOL) 500 MG tablet Take 500 mg by mouth every 6 (six) hours as needed for mild pain.     [provider]  letrozole (FEMARA) 2.5 MG tablet Take 1 tablet (2.5 mg total) by mouth daily. Patient not taking: Reported on 11/20/2021 02/25/21   [name], MD    Social History   Socioeconomic History   Marital status: Married    Spouse name: Not on file   Number of children: Not on file   Years of education: Not on file   Highest education level: Not on file  Occupational History   Not on file  Tobacco Use   Smoking status: Never   Smokeless tobacco: Never  Vaping  Use   Vaping Use: Never used  Substance and Sexual Activity   Alcohol use: No   Drug use: No   Sexual activity: Not on file  Other Topics Concern   Not on file  Social History Narrative   Not on file   Social Determinants of Health   Financial Resource Strain: Not on file  Food Insecurity: Not on file  Transportation Needs: Not on file  Physical Activity: Not on file  Stress: Not on file  Social Connections: Not on file  Intimate Partner Violence: Not on file    Family History  Problem Relation Age of Onset   Prostate cancer Father    Prostate cancer Brother     ROS: R hip pain   Physical Examination  Vitals:   11/25/21 0538  BP: 130/67   Pulse: 66  Resp: 18  Temp: 98 F (36.7 C)  SpO2: 100%   Body mass index is 32.62 kg/m.  Aaox3 Non labored respirations Abdomen is soft  CBC    Component Value Date/Time   WBC 6.8 11/22/2021 1045   RBC 4.64 11/22/2021 1045   HGB 11.9 (L) 11/25/2021 0619   HGB 11.4 (L) 12/30/2018 1126   HCT 35.0 (L) 11/25/2021 0619   PLT 252 11/22/2021 1045   PLT 288 12/30/2018 1126   MCV 82.8 11/22/2021 1045   MCH 25.4 (L) 11/22/2021 1045   MCHC 30.7 11/22/2021 1045   RDW 14.6 11/22/2021 1045   LYMPHSABS 2.9 12/30/2018 1126   MONOABS 0.6 12/30/2018 1126   EOSABS 0.3 12/30/2018 1126   BASOSABS 0.0 12/30/2018 1126    BMET    Component Value Date/Time   NA 142 11/25/2021 0619   K 3.8 11/25/2021 0619   CL 110 11/25/2021 0619   CO2 25 06/04/2019 1251   GLUCOSE 89 11/25/2021 0619   BUN 14 11/25/2021 0619   CREATININE 0.60 11/25/2021 0619   CREATININE 0.83 12/28/2017 0854   CALCIUM 9.4 06/04/2019 1251   GFRNONAA >60 06/04/2019 1251   GFRNONAA >60 12/28/2017 0854   GFRAA >60 06/04/2019 1251   GFRAA >60 12/28/2017 0854    COAGS: Lab Results  Component Value Date   INR 2.2 (H) 11/25/2021   INR 2.9 (H) 06/04/2019   INR 1.84 06/15/2018     Non-Invasive Vascular Imaging:   No studies  ASSESSMENT/PLAN: This is a 76 y.o. female with history of dvt and pe now with plans for R THA. Plan IVC filter placement today and removal in 3 months. We have discussed r/b/a/I and she agrees to proceed.   Cassius Cullinane C. Donzetta Matters, MD [facility] Office: [phone] Pager: [phone]

## 2021-11-25 NOTE — Op Note (Signed)
° ° °  Patient name: Brittany [name] MRN: [id number] DOB: 08-08-1946 Sex: female  11/25/2021 Pre-operative Diagnosis: History of PE, need for right total hip arthroplasty Post-operative diagnosis:  Same Surgeon:  Eda Paschal. Donzetta Matters, MD Procedure Performed: 1.  Ultrasound-guided cannulation right common femoral vein 2.  Placement of Cook Celect IVC filter in infrarenal position 3.  Moderate sedation with fentanyl and Versed for 22 minutes 4.  IVC venogram   Indications: 76 year old female with history of PE on 2 separate occasions.  She is now on chronic Coumadin.  She is scheduled for right total hip arthroplasty.  She is indicated for filter placement.  Findings: IVC was patent and measured less than 28 mm filter was placed in the infrarenal position.  Plan will be to remove filter in 3 months   Procedure:  The patient was identified in the holding area and taken to room 8.  The patient was then placed supine on the table and prepped and draped in the usual sterile fashion.  A time out was called.  Ultrasound was used to evaluate the right common femoral vein was patent and compressible.  There is anesthetized 1% lidocaine.  Moderate sedation was administered with fentanyl and Versed and her vital signs were monitored throughout the case.  The common femoral vein was cannulated with micropuncture needle followed by wire and sheath.  Bentson wire was then placed and the introducer sheath was placed and venogram was performed.  Filter was then placed in the infrarenal position.  Sheath was removed she tolerated procedure well without immediate complication.   Contrast: 35 minutes  Cathyrn Deas C. Donzetta Matters, MD [facility] [place] Office: [phone] Pager: [phone]

## 2021-11-29 ENCOUNTER — Ambulatory Visit
Admission: RE | Admit: 2021-11-29 | Discharge: 2021-11-29 | Disposition: A | Discharge status: Home / Self Care | Payer: Medicare Other | Source: Ambulatory Visit | Attending: Orthopedic Surgery | Admitting: Orthopedic Surgery

## 2021-11-29 DIAGNOSIS — M1611 Unilateral primary osteoarthritis, right hip: Secondary | ICD-10-CM | POA: Diagnosis not present

## 2021-11-29 DIAGNOSIS — M25551 Pain in right hip: Secondary | ICD-10-CM

## 2021-12-03 ENCOUNTER — Encounter (HOSPITAL_COMMUNITY)
Admission: RE | Admit: 2021-12-03 | Discharge: 2021-12-03 | Disposition: A | Discharge status: Home / Self Care | Payer: Medicare Other | Source: Ambulatory Visit | Attending: Orthopedic Surgery | Admitting: Orthopedic Surgery

## 2021-12-03 ENCOUNTER — Other Ambulatory Visit: Payer: Self-pay

## 2021-12-03 DIAGNOSIS — M1611 Unilateral primary osteoarthritis, right hip: Secondary | ICD-10-CM | POA: Diagnosis present

## 2021-12-03 DIAGNOSIS — Z6832 Body mass index (BMI) 32.0-32.9, adult: Secondary | ICD-10-CM | POA: Diagnosis not present

## 2021-12-03 DIAGNOSIS — E669 Obesity, unspecified: Secondary | ICD-10-CM | POA: Diagnosis not present

## 2021-12-03 DIAGNOSIS — T1490XS Injury, unspecified, sequela: Secondary | ICD-10-CM | POA: Diagnosis not present

## 2021-12-03 DIAGNOSIS — M1651 Unilateral post-traumatic osteoarthritis, right hip: Secondary | ICD-10-CM | POA: Diagnosis not present

## 2021-12-03 DIAGNOSIS — Z20822 Contact with and (suspected) exposure to covid-19: Secondary | ICD-10-CM | POA: Diagnosis not present

## 2021-12-03 DIAGNOSIS — X58XXXS Exposure to other specified factors, sequela: Secondary | ICD-10-CM | POA: Diagnosis not present

## 2021-12-03 LAB — SARS CORONAVIRUS 2 (TAT 6-24 HRS): SARS Coronavirus 2: NEGATIVE

## 2021-12-04 ENCOUNTER — Other Ambulatory Visit: Payer: Self-pay | Admitting: Family Medicine

## 2021-12-04 ENCOUNTER — Ambulatory Visit
Admission: RE | Admit: 2021-12-04 | Discharge: 2021-12-04 | Disposition: A | Discharge status: Home / Self Care | Payer: Medicare Other | Source: Ambulatory Visit | Attending: Family Medicine | Admitting: Family Medicine

## 2021-12-04 ENCOUNTER — Encounter (HOSPITAL_COMMUNITY): Payer: Self-pay | Admitting: Orthopedic Surgery

## 2021-12-04 DIAGNOSIS — Z1239 Encounter for other screening for malignant neoplasm of breast: Secondary | ICD-10-CM

## 2021-12-04 DIAGNOSIS — Z1231 Encounter for screening mammogram for malignant neoplasm of breast: Secondary | ICD-10-CM | POA: Diagnosis not present

## 2021-12-04 DIAGNOSIS — Z853 Personal history of malignant neoplasm of breast: Secondary | ICD-10-CM

## 2021-12-05 ENCOUNTER — Ambulatory Visit (HOSPITAL_BASED_OUTPATIENT_CLINIC_OR_DEPARTMENT_OTHER): Payer: Medicare Other | Admitting: Anesthesiology

## 2021-12-05 ENCOUNTER — Ambulatory Visit (HOSPITAL_COMMUNITY): Payer: Medicare Other

## 2021-12-05 ENCOUNTER — Ambulatory Visit (HOSPITAL_COMMUNITY)
Admission: RE | Admit: 2021-12-05 | Discharge: 2021-12-06 | Disposition: A | Discharge status: Home / Self Care | Payer: Medicare Other | Source: Ambulatory Visit | Attending: Orthopedic Surgery | Admitting: Orthopedic Surgery

## 2021-12-05 ENCOUNTER — Other Ambulatory Visit: Payer: Self-pay

## 2021-12-05 ENCOUNTER — Encounter (HOSPITAL_COMMUNITY)
Admission: RE | Disposition: A | Discharge status: Home / Self Care | Payer: Self-pay | Source: Ambulatory Visit | Attending: Orthopedic Surgery

## 2021-12-05 ENCOUNTER — Ambulatory Visit (HOSPITAL_COMMUNITY): Payer: Medicare Other | Admitting: Physician Assistant

## 2021-12-05 ENCOUNTER — Encounter (HOSPITAL_COMMUNITY): Payer: Self-pay | Admitting: Orthopedic Surgery

## 2021-12-05 DIAGNOSIS — M1611 Unilateral primary osteoarthritis, right hip: Secondary | ICD-10-CM | POA: Diagnosis present

## 2021-12-05 DIAGNOSIS — Z6832 Body mass index (BMI) 32.0-32.9, adult: Secondary | ICD-10-CM | POA: Diagnosis not present

## 2021-12-05 DIAGNOSIS — Z471 Aftercare following joint replacement surgery: Secondary | ICD-10-CM | POA: Diagnosis not present

## 2021-12-05 DIAGNOSIS — Z472 Encounter for removal of internal fixation device: Secondary | ICD-10-CM | POA: Diagnosis not present

## 2021-12-05 DIAGNOSIS — T1490XS Injury, unspecified, sequela: Secondary | ICD-10-CM | POA: Insufficient documentation

## 2021-12-05 DIAGNOSIS — E669 Obesity, unspecified: Secondary | ICD-10-CM | POA: Diagnosis not present

## 2021-12-05 DIAGNOSIS — M1651 Unilateral post-traumatic osteoarthritis, right hip: Secondary | ICD-10-CM

## 2021-12-05 DIAGNOSIS — Z96641 Presence of right artificial hip joint: Secondary | ICD-10-CM | POA: Diagnosis not present

## 2021-12-05 DIAGNOSIS — X58XXXS Exposure to other specified factors, sequela: Secondary | ICD-10-CM | POA: Insufficient documentation

## 2021-12-05 DIAGNOSIS — Z419 Encounter for procedure for purposes other than remedying health state, unspecified: Secondary | ICD-10-CM

## 2021-12-05 DIAGNOSIS — Z20822 Contact with and (suspected) exposure to covid-19: Secondary | ICD-10-CM | POA: Diagnosis not present

## 2021-12-05 DIAGNOSIS — Z09 Encounter for follow-up examination after completed treatment for conditions other than malignant neoplasm: Secondary | ICD-10-CM

## 2021-12-05 DIAGNOSIS — M12551 Traumatic arthropathy, right hip: Secondary | ICD-10-CM | POA: Diagnosis present

## 2021-12-05 DIAGNOSIS — Z01812 Encounter for preprocedural laboratory examination: Secondary | ICD-10-CM

## 2021-12-05 HISTORY — PX: TOTAL HIP ARTHROPLASTY: SHX124

## 2021-12-05 LAB — CBC
HCT: 36.2 % (ref 36.0–46.0)
HCT: 36.7 % (ref 36.0–46.0)
Hemoglobin: 11.1 g/dL — ABNORMAL LOW (ref 12.0–15.0)
Hemoglobin: 11.3 g/dL — ABNORMAL LOW (ref 12.0–15.0)
MCH: 25.5 pg — ABNORMAL LOW (ref 26.0–34.0)
MCH: 25.9 pg — ABNORMAL LOW (ref 26.0–34.0)
MCHC: 30.7 g/dL (ref 30.0–36.0)
MCHC: 30.8 g/dL (ref 30.0–36.0)
MCV: 83.2 fL (ref 80.0–100.0)
MCV: 84.2 fL (ref 80.0–100.0)
Platelets: 240 10*3/uL (ref 150–400)
Platelets: 233 10*3/uL (ref 150–400)
RBC: 4.35 MIL/uL (ref 3.87–5.11)
RBC: 4.36 MIL/uL (ref 3.87–5.11)
RDW: 14.6 % (ref 11.5–15.5)
RDW: 14.6 % (ref 11.5–15.5)
WBC: 5.5 10*3/uL (ref 4.0–10.5)
WBC: 10.2 10*3/uL (ref 4.0–10.5)
nRBC: 0 % (ref 0.0–0.2)
nRBC: 0 % (ref 0.0–0.2)

## 2021-12-05 LAB — CREATININE, SERUM
Creatinine, Ser: 0.77 mg/dL (ref 0.44–1.00)
GFR, Estimated: >60 mL/min

## 2021-12-05 LAB — PROTIME-INR
INR: 1.1 (ref 0.8–1.2)
Prothrombin Time: 13.9 seconds (ref 11.4–15.2)

## 2021-12-05 SURGERY — ARTHROPLASTY, HIP, TOTAL, ANTERIOR APPROACH
Anesthesia: Spinal | Site: Hip | Laterality: Right

## 2021-12-05 MED ORDER — LACTATED RINGERS IV SOLN: INTRAVENOUS | Status: DC

## 2021-12-05 MED ORDER — KETOROLAC TROMETHAMINE 30 MG/ML IJ SOLN
INTRAMUSCULAR | Status: AC
  Filled 2021-12-05: qty 1

## 2021-12-05 MED ORDER — PHENYLEPHRINE HCL-NACL 20-0.9 MG/250ML-% IV SOLN
INTRAVENOUS | Status: DC | PRN
Start: 2021-12-05 — End: 2021-12-05
  Administered 2021-12-05: 50 ug/min via INTRAVENOUS

## 2021-12-05 MED ORDER — ACETAMINOPHEN 325 MG PO TABS
325.0000 mg | ORAL_TABLET | Freq: Four times a day (QID) | ORAL | Status: DC | PRN
  Administered 2021-12-06: 325 mg via ORAL
  Filled 2021-12-05: qty 1

## 2021-12-05 MED ORDER — ONDANSETRON HCL 4 MG/2ML IJ SOLN
INTRAMUSCULAR | Status: AC
  Filled 2021-12-05: qty 2

## 2021-12-05 MED ORDER — POVIDONE-IODINE 10 % EX SWAB: 2.0000 "application " | Freq: Once | CUTANEOUS | Status: DC

## 2021-12-05 MED ORDER — ONDANSETRON HCL 4 MG/2ML IJ SOLN
INTRAMUSCULAR | Status: DC | PRN
  Administered 2021-12-05: 4 mg via INTRAVENOUS

## 2021-12-05 MED ORDER — MAGNESIUM 200 MG PO TABS: 500.0000 mg | ORAL_TABLET | Freq: Every day | ORAL | Status: DC

## 2021-12-05 MED ORDER — HYDROCODONE-ACETAMINOPHEN 7.5-325 MG PO TABS: 1.0000 | ORAL_TABLET | ORAL | Status: DC | PRN

## 2021-12-05 MED ORDER — FENTANYL CITRATE (PF) 100 MCG/2ML IJ SOLN
INTRAMUSCULAR | Status: DC | PRN
Start: 2021-12-05 — End: 2021-12-05
  Administered 2021-12-05: 50 ug via INTRAVENOUS

## 2021-12-05 MED ORDER — ONDANSETRON HCL 4 MG/2ML IJ SOLN: 4.0000 mg | Freq: Four times a day (QID) | INTRAMUSCULAR | Status: DC | PRN

## 2021-12-05 MED ORDER — MAGNESIUM OXIDE -MG SUPPLEMENT 400 (240 MG) MG PO TABS
200.0000 mg | ORAL_TABLET | Freq: Every day | ORAL | Status: DC
  Administered 2021-12-06: 200 mg via ORAL
  Filled 2021-12-05: qty 1

## 2021-12-05 MED ORDER — WATER FOR IRRIGATION, STERILE IR SOLN
Status: DC | PRN
  Administered 2021-12-05: 2000 mL

## 2021-12-05 MED ORDER — ACETAMINOPHEN 10 MG/ML IV SOLN: 1000.0000 mg | Freq: Once | INTRAVENOUS | Status: DC | PRN

## 2021-12-05 MED ORDER — SENNA 8.6 MG PO TABS
1.0000 | ORAL_TABLET | Freq: Two times a day (BID) | ORAL | Status: DC
  Administered 2021-12-05 – 2021-12-06 (×2): 8.6 mg via ORAL
  Filled 2021-12-05 (×2): qty 1

## 2021-12-05 MED ORDER — ISOPROPYL ALCOHOL 70 % SOLN
Status: DC | PRN
  Administered 2021-12-05: 1 via TOPICAL

## 2021-12-05 MED ORDER — CEFAZOLIN SODIUM-DEXTROSE 2-4 GM/100ML-% IV SOLN
2.0000 g | INTRAVENOUS | Status: AC
  Administered 2021-12-05: 2 g via INTRAVENOUS
  Filled 2021-12-05: qty 100

## 2021-12-05 MED ORDER — CEFAZOLIN SODIUM-DEXTROSE 2-4 GM/100ML-% IV SOLN
2.0000 g | Freq: Four times a day (QID) | INTRAVENOUS | Status: AC
  Administered 2021-12-05 (×2): 2 g via INTRAVENOUS
  Filled 2021-12-05 (×2): qty 100

## 2021-12-05 MED ORDER — ALUM & MAG HYDROXIDE-SIMETH 200-200-20 MG/5ML PO SUSP: 30.0000 mL | ORAL | Status: DC | PRN

## 2021-12-05 MED ORDER — HYDROMORPHONE HCL 1 MG/ML IJ SOLN: 0.2500 mg | INTRAMUSCULAR | Status: DC | PRN

## 2021-12-05 MED ORDER — AMISULPRIDE (ANTIEMETIC) 5 MG/2ML IV SOLN: 10.0000 mg | Freq: Once | INTRAVENOUS | Status: DC | PRN

## 2021-12-05 MED ORDER — KETOROLAC TROMETHAMINE 30 MG/ML IJ SOLN
INTRAMUSCULAR | Status: DC | PRN
  Administered 2021-12-05: 30 mg

## 2021-12-05 MED ORDER — SODIUM CHLORIDE 0.9 % IR SOLN
Status: DC | PRN
  Administered 2021-12-05 (×2): 1000 mL

## 2021-12-05 MED ORDER — DIPHENHYDRAMINE HCL 12.5 MG/5ML PO ELIX: 12.5000 mg | ORAL_SOLUTION | ORAL | Status: DC | PRN

## 2021-12-05 MED ORDER — CELECOXIB 200 MG PO CAPS
200.0000 mg | ORAL_CAPSULE | Freq: Two times a day (BID) | ORAL | Status: DC
  Administered 2021-12-05 – 2021-12-06 (×2): 200 mg via ORAL
  Filled 2021-12-05 (×2): qty 1

## 2021-12-05 MED ORDER — ORAL CARE MOUTH RINSE: 15.0000 mL | Freq: Once | OROMUCOSAL | Status: AC

## 2021-12-05 MED ORDER — METOCLOPRAMIDE HCL 5 MG PO TABS: 5.0000 mg | ORAL_TABLET | Freq: Three times a day (TID) | ORAL | Status: DC | PRN

## 2021-12-05 MED ORDER — FENTANYL CITRATE (PF) 100 MCG/2ML IJ SOLN
INTRAMUSCULAR | Status: AC
  Filled 2021-12-05: qty 2

## 2021-12-05 MED ORDER — ACETAMINOPHEN 10 MG/ML IV SOLN
INTRAVENOUS | Status: AC
  Filled 2021-12-05: qty 100

## 2021-12-05 MED ORDER — PHENOL 1.4 % MT LIQD: 1.0000 | OROMUCOSAL | Status: DC | PRN

## 2021-12-05 MED ORDER — ONDANSETRON HCL 4 MG PO TABS: 4.0000 mg | ORAL_TABLET | Freq: Four times a day (QID) | ORAL | Status: DC | PRN

## 2021-12-05 MED ORDER — CALCIUM CARBONATE 1250 (500 CA) MG PO TABS
1.0000 | ORAL_TABLET | Freq: Every day | ORAL | Status: DC
  Administered 2021-12-06: 500 mg via ORAL
  Filled 2021-12-05: qty 1

## 2021-12-05 MED ORDER — CHLORHEXIDINE GLUCONATE 0.12 % MT SOLN
15.0000 mL | Freq: Once | OROMUCOSAL | Status: AC
  Administered 2021-12-05: 15 mL via OROMUCOSAL

## 2021-12-05 MED ORDER — WARFARIN - PHARMACIST DOSING INPATIENT: Freq: Every day | Status: DC

## 2021-12-05 MED ORDER — METOCLOPRAMIDE HCL 5 MG/ML IJ SOLN: 5.0000 mg | Freq: Three times a day (TID) | INTRAMUSCULAR | Status: DC | PRN

## 2021-12-05 MED ORDER — PROPOFOL 10 MG/ML IV BOLUS
INTRAVENOUS | Status: DC | PRN
Start: 2021-12-05 — End: 2021-12-05
  Administered 2021-12-05: 30 mg via INTRAVENOUS

## 2021-12-05 MED ORDER — BUPIVACAINE IN DEXTROSE 0.75-8.25 % IT SOLN
INTRATHECAL | Status: DC | PRN
Start: 2021-12-05 — End: 2021-12-05
  Administered 2021-12-05: 2 mL via INTRATHECAL

## 2021-12-05 MED ORDER — SODIUM CHLORIDE 0.9 % IV SOLN: INTRAVENOUS | Status: DC

## 2021-12-05 MED ORDER — ACETAMINOPHEN 325 MG PO TABS: 325.0000 mg | ORAL_TABLET | Freq: Once | ORAL | Status: DC | PRN

## 2021-12-05 MED ORDER — MEPERIDINE HCL 50 MG/ML IJ SOLN: 6.2500 mg | INTRAMUSCULAR | Status: DC | PRN

## 2021-12-05 MED ORDER — ACETAMINOPHEN 10 MG/ML IV SOLN
INTRAVENOUS | Status: DC | PRN
  Administered 2021-12-05: 1000 mg via INTRAVENOUS

## 2021-12-05 MED ORDER — POLYETHYLENE GLYCOL 3350 17 G PO PACK: 17.0000 g | PACK | Freq: Every day | ORAL | Status: DC | PRN

## 2021-12-05 MED ORDER — TRANEXAMIC ACID-NACL 1000-0.7 MG/100ML-% IV SOLN
1000.0000 mg | INTRAVENOUS | Status: AC
  Administered 2021-12-05: 1000 mg via INTRAVENOUS
  Filled 2021-12-05: qty 100

## 2021-12-05 MED ORDER — PROPOFOL 500 MG/50ML IV EMUL
INTRAVENOUS | Status: DC | PRN
  Administered 2021-12-05: 50 ug/kg/min via INTRAVENOUS

## 2021-12-05 MED ORDER — DEXAMETHASONE SODIUM PHOSPHATE 10 MG/ML IJ SOLN
10.0000 mg | Freq: Once | INTRAMUSCULAR | Status: AC
  Administered 2021-12-06: 10 mg via INTRAVENOUS
  Filled 2021-12-05: qty 1

## 2021-12-05 MED ORDER — ACETAMINOPHEN 160 MG/5ML PO SOLN: 325.0000 mg | Freq: Once | ORAL | Status: DC | PRN

## 2021-12-05 MED ORDER — DEXAMETHASONE SODIUM PHOSPHATE 10 MG/ML IJ SOLN
INTRAMUSCULAR | Status: AC
  Filled 2021-12-05: qty 1

## 2021-12-05 MED ORDER — ENOXAPARIN SODIUM 40 MG/0.4ML IJ SOSY
40.0000 mg | PREFILLED_SYRINGE | INTRAMUSCULAR | Status: DC
  Administered 2021-12-06: 40 mg via SUBCUTANEOUS
  Filled 2021-12-05: qty 0.4

## 2021-12-05 MED ORDER — BUPIVACAINE-EPINEPHRINE (PF) 0.25% -1:200000 IJ SOLN
INTRAMUSCULAR | Status: AC
  Filled 2021-12-05: qty 30

## 2021-12-05 MED ORDER — MORPHINE SULFATE (PF) 2 MG/ML IV SOLN: 0.5000 mg | INTRAVENOUS | Status: DC | PRN

## 2021-12-05 MED ORDER — IRRISEPT - 450ML BOTTLE WITH 0.05% CHG IN STERILE WATER, USP 99.95% OPTIME
TOPICAL | Status: DC | PRN
  Administered 2021-12-05: 450 mL

## 2021-12-05 MED ORDER — ISOPROPYL ALCOHOL 70 % SOLN
Status: AC
  Filled 2021-12-05: qty 480

## 2021-12-05 MED ORDER — WARFARIN SODIUM 4 MG PO TABS
8.0000 mg | ORAL_TABLET | Freq: Once | ORAL | Status: AC
  Administered 2021-12-05: 8 mg via ORAL
  Filled 2021-12-05: qty 2

## 2021-12-05 MED ORDER — BUPIVACAINE-EPINEPHRINE 0.25% -1:200000 IJ SOLN
INTRAMUSCULAR | Status: DC | PRN
  Administered 2021-12-05: 30 mL

## 2021-12-05 MED ORDER — HYDROCODONE-ACETAMINOPHEN 5-325 MG PO TABS
1.0000 | ORAL_TABLET | ORAL | Status: DC | PRN
  Administered 2021-12-06: 1 via ORAL
  Filled 2021-12-05: qty 1

## 2021-12-05 MED ORDER — PROPOFOL 1000 MG/100ML IV EMUL
INTRAVENOUS | Status: AC
  Filled 2021-12-05: qty 100

## 2021-12-05 MED ORDER — CALCIUM 600 MG PO TABS: 600.0000 mg | ORAL_TABLET | Freq: Every day | ORAL | Status: DC

## 2021-12-05 MED ORDER — METHOCARBAMOL 500 MG PO TABS: 500.0000 mg | ORAL_TABLET | Freq: Four times a day (QID) | ORAL | Status: DC | PRN

## 2021-12-05 MED ORDER — METHOCARBAMOL 500 MG IVPB - SIMPLE MED
500.0000 mg | Freq: Four times a day (QID) | INTRAVENOUS | Status: DC | PRN
  Filled 2021-12-05: qty 50

## 2021-12-05 MED ORDER — SODIUM CHLORIDE (PF) 0.9 % IJ SOLN
INTRAMUSCULAR | Status: DC | PRN
  Administered 2021-12-05: 30 mL via INTRAVENOUS

## 2021-12-05 MED ORDER — POVIDONE-IODINE 10 % EX SWAB
2.0000 "application " | Freq: Once | CUTANEOUS | Status: AC
  Administered 2021-12-05: 2 via TOPICAL

## 2021-12-05 MED ORDER — MENTHOL 3 MG MT LOZG: 1.0000 | LOZENGE | OROMUCOSAL | Status: DC | PRN

## 2021-12-05 MED ORDER — ASCORBIC ACID 500 MG PO TABS
1000.0000 mg | ORAL_TABLET | Freq: Every day | ORAL | Status: DC
  Administered 2021-12-06: 1000 mg via ORAL
  Filled 2021-12-05: qty 2

## 2021-12-05 MED ORDER — DEXAMETHASONE SODIUM PHOSPHATE 10 MG/ML IJ SOLN
INTRAMUSCULAR | Status: DC | PRN
  Administered 2021-12-05: 10 mg via INTRAVENOUS

## 2021-12-05 MED ORDER — PROMETHAZINE HCL 25 MG/ML IJ SOLN: 6.2500 mg | INTRAMUSCULAR | Status: DC | PRN

## 2021-12-05 MED ORDER — VITAMIN D 25 MCG (1000 UNIT) PO TABS
1000.0000 [IU] | ORAL_TABLET | Freq: Every day | ORAL | Status: DC
  Administered 2021-12-06: 1000 [IU] via ORAL
  Filled 2021-12-05: qty 1

## 2021-12-05 MED ORDER — DOCUSATE SODIUM 100 MG PO CAPS
100.0000 mg | ORAL_CAPSULE | Freq: Two times a day (BID) | ORAL | Status: DC
  Administered 2021-12-05 – 2021-12-06 (×2): 100 mg via ORAL
  Filled 2021-12-05 (×2): qty 1

## 2021-12-05 SURGICAL SUPPLY — 72 items
ACE SHELL 54 4H HIP (Shell) ×2 IMPLANT
ADH SKN CLS APL DERMABOND .7 (GAUZE/BANDAGES/DRESSINGS) ×1
APL PRP STRL LF DISP 70% ISPRP (MISCELLANEOUS) ×1
BAG COUNTER SPONGE SURGICOUNT (BAG) IMPLANT
BAG DECANTER FOR FLEXI CONT (MISCELLANEOUS) IMPLANT
BAG SPEC THK2 15X12 ZIP CLS (MISCELLANEOUS)
BAG SPNG CNTER NS LX DISP (BAG)
BAG ZIPLOCK 12X15 (MISCELLANEOUS) IMPLANT
CHLORAPREP W/TINT 26 (MISCELLANEOUS) ×3 IMPLANT
COVER PERINEAL POST (MISCELLANEOUS) ×3 IMPLANT
COVER SURGICAL LIGHT HANDLE (MISCELLANEOUS) ×3 IMPLANT
DERMABOND ADVANCED (GAUZE/BANDAGES/DRESSINGS) ×1
DERMABOND ADVANCED .7 DNX12 (GAUZE/BANDAGES/DRESSINGS) ×4 IMPLANT
DRAPE C-ARM 42X120 X-RAY (DRAPES) ×1 IMPLANT
DRAPE IMP U-DRAPE 54X76 (DRAPES) ×3 IMPLANT
DRAPE SHEET LG 3/4 BI-LAMINATE (DRAPES) ×9 IMPLANT
DRAPE STERI IOBAN 125X83 (DRAPES) ×3 IMPLANT
DRAPE U-SHAPE 47X51 STRL (DRAPES) ×6 IMPLANT
DRSG AQUACEL AG ADV 3.5X10 (GAUZE/BANDAGES/DRESSINGS) ×3 IMPLANT
ELECT REM PT RETURN 15FT ADLT (MISCELLANEOUS) ×3 IMPLANT
GAUZE SPONGE 4X4 12PLY STRL (GAUZE/BANDAGES/DRESSINGS) ×3 IMPLANT
GLOVE SRG 8 PF TXTR STRL LF DI (GLOVE) ×2 IMPLANT
GLOVE SURG ENC MOIS LTX SZ8.5 (GLOVE) ×6 IMPLANT
GLOVE SURG ENC TEXT LTX SZ7.5 (GLOVE) ×6 IMPLANT
GLOVE SURG POLYISO LF SZ7 (GLOVE) ×1 IMPLANT
GLOVE SURG POLYISO LF SZ7.5 (GLOVE) ×1 IMPLANT
GLOVE SURG UNDER POLY LF SZ7 (GLOVE) ×5 IMPLANT
GLOVE SURG UNDER POLY LF SZ7.5 (GLOVE) ×1 IMPLANT
GLOVE SURG UNDER POLY LF SZ8 (GLOVE) ×2
GLOVE SURG UNDER POLY LF SZ8.5 (GLOVE) ×3 IMPLANT
GOWN SPEC L3 XXLG W/TWL (GOWN DISPOSABLE) ×5 IMPLANT
GOWN STRL REUS W/TWL XL LVL3 (GOWN DISPOSABLE) ×6 IMPLANT
HANDPIECE INTERPULSE COAX TIP (DISPOSABLE) ×2
HEAD FEM -6XOFST 36XMDLR (Orthopedic Implant) IMPLANT
HEAD MODULAR 36MM (Orthopedic Implant) ×2 IMPLANT
HOLDER FOLEY CATH W/STRAP (MISCELLANEOUS) ×3 IMPLANT
HOOD PEEL AWAY FLYTE STAYCOOL (MISCELLANEOUS) ×9 IMPLANT
IV NS 1000ML (IV SOLUTION) ×4
IV NS 1000ML BAXH (IV SOLUTION) IMPLANT
IV NS 50ML (IV SOLUTION) IMPLANT
JET LAVAGE IRRISEPT WOUND (IRRIGATION / IRRIGATOR) ×2
KIT TURNOVER KIT A (KITS) IMPLANT
LAVAGE JET IRRISEPT WOUND (IRRIGATION / IRRIGATOR) ×2 IMPLANT
LINER ACETAB NN G7 F 36 (Liner) ×1 IMPLANT
MANIFOLD NEPTUNE II (INSTRUMENTS) ×3 IMPLANT
MARKER SKIN DUAL TIP RULER LAB (MISCELLANEOUS) ×3 IMPLANT
NDL SAFETY ECLIPSE 18X1.5 (NEEDLE) ×2 IMPLANT
NDL SPNL 18GX3.5 QUINCKE PK (NEEDLE) ×2 IMPLANT
NEEDLE HYPO 18GX1.5 SHARP (NEEDLE) ×2
NEEDLE SPNL 18GX3.5 QUINCKE PK (NEEDLE) ×2 IMPLANT
NS IRRIG 1000ML POUR BTL (IV SOLUTION) ×1 IMPLANT
PACK ANTERIOR HIP CUSTOM (KITS) ×3 IMPLANT
PENCIL SMOKE EVACUATOR (MISCELLANEOUS) IMPLANT
SAW OSC TIP CART 19.5X105X1.3 (SAW) ×3 IMPLANT
SEALER BIPOLAR AQUA 6.0 (INSTRUMENTS) ×3 IMPLANT
SET HNDPC FAN SPRY TIP SCT (DISPOSABLE) ×2 IMPLANT
SHELL ACETAB 54 4H HIP (Shell) IMPLANT
SPIKE FLUID TRANSFER (MISCELLANEOUS) ×3 IMPLANT
SPONGE T-LAP 18X18 ~~LOC~~+RFID (SPONGE) ×9 IMPLANT
STAPLER INSORB 30 2030 C-SECTI (MISCELLANEOUS) IMPLANT
STEM FEM SZ18 121X42.6 133D (Stem) ×1 IMPLANT
SUT MNCRL AB 3-0 PS2 18 (SUTURE) ×3 IMPLANT
SUT MON AB 2-0 CT1 36 (SUTURE) ×3 IMPLANT
SUT STRATAFIX PDO 1 14 VIOLET (SUTURE) ×2
SUT STRATFX PDO 1 14 VIOLET (SUTURE) ×1
SUT VIC AB 2-0 CT1 27 (SUTURE)
SUT VIC AB 2-0 CT1 TAPERPNT 27 (SUTURE) IMPLANT
SUTURE STRATFX PDO 1 14 VIOLET (SUTURE) ×2 IMPLANT
SYR 3ML LL SCALE MARK (SYRINGE) ×3 IMPLANT
TRAY FOLEY MTR SLVR 16FR STAT (SET/KITS/TRAYS/PACK) IMPLANT
TUBE SUCTION HIGH CAP CLEAR NV (SUCTIONS) ×3 IMPLANT
WATER STERILE IRR 1000ML POUR (IV SOLUTION) ×4 IMPLANT

## 2021-12-05 NOTE — Evaluation (Signed)
Physical Therapy Evaluation Patient Details Name: Brittany [name] MRN: [id number] DOB: 06-Mar-1946 Today's Date: 12/05/2021  History of Present Illness  Pt is a 76 y.o. female s/p Rt THA on 12/05/2021. PMH significant for chronic back pain, PE, DVT, and breast cancer.  Clinical Impression  Pt is POD 0 s/p Rt THA resulting in the deficits listed below (see PT Problem List). Pt performed sit to stand transfers with MIN A for power up and stability in standing due to unsteadiness and sway, suspect effects of spinal block. Pt able to perform step pivot to transfer to recliner chair, reported onset of dizziness in standing, BP 124/62. Resolution of symptoms with seated rest.  Pt educated on therapeutic interventions for circulation, pt demonstrated understanding and provided HEP handout to utilize in future sessions. Pt will have assist from her husband upon d/c. Pt will benefit from skilled PT to maximize safety with functional mobility and increase independence.         Recommendations for follow up therapy are one component of a multi-disciplinary discharge planning process, led by the attending physician.  Recommendations may be updated based on patient status, additional functional criteria and insurance authorization.  Follow Up Recommendations Follow physician's recommendations for discharge plan and follow up therapies    Assistance Recommended at Discharge Frequent or constant Supervision/Assistance  Patient can return home with the following  A little help with walking and/or transfers;A little help with bathing/dressing/bathroom;Assistance with cooking/housework;Assist for transportation;Help with stairs or ramp for entrance    Equipment Recommendations Rolling walker (2 wheels)  Recommendations for Other Services       Functional Status Assessment Patient has had a recent decline in their functional status and demonstrates the ability to make significant improvements in function in a  reasonable and predictable amount of time.     Precautions / Restrictions Precautions Precautions: Fall Restrictions Weight Bearing Restrictions: No Other Position/Activity Restrictions: WBAT      Mobility  Bed Mobility Overal bed mobility: Needs Assistance Bed Mobility: Supine to Sit     Supine to sit: Supervision, HOB elevated          Transfers Overall transfer level: Needs assistance Equipment used: Rolling walker (2 wheels) Transfers: Sit to/from Stand, Bed to chair/wheelchair/BSC Sit to Stand: Min assist   Step pivot transfers: Min assist       General transfer comment: MIN A to power up to stand, mild sway and instability in standing, suspect due to effects of spinal block not completely worn off. Pt reporting numbness in buttocks and decreased sensation to light touch in B feet. Reported onset of dizziness in standing, pt assisted to recliner chair with MIN A and cues for sequencing. BP 1245/62, HR 76bpm seated in recliner. Dizziness resolved with seated rest.    Ambulation/Gait                  Stairs            Wheelchair Mobility    Modified Rankin (Stroke Patients Only)       Balance Overall balance assessment: Needs assistance Sitting-balance support: Feet supported Sitting balance-Leahy Scale: Good     Standing balance support: Bilateral upper extremity supported, Reliant on assistive device for balance Standing balance-Leahy Scale: Poor                               Pertinent Vitals/Pain Pain Assessment Pain Assessment: No/denies pain    Home  Living Family/patient expects to be discharged to:: Private residence Living Arrangements: Spouse/significant other Available Help at Discharge: Family;Friend(s) Type of Home: House Home Access: Stairs to enter Entrance Stairs-Rails: None Entrance Stairs-Number of Steps: 1   Home Layout: Two level;Full bath on main level;Able to live on main level with  bedroom/bathroom Home Equipment: Rollator (4 wheels);Standard Walker;Cane - single point;Grab bars - tub/shower;Hand held shower head Additional Comments: husband will assist at d/c    Prior Function Prior Level of Function : Independent/Modified Independent;Driving                     Hand Dominance   Dominant Hand: Left    Extremity/Trunk Assessment   Upper Extremity Assessment Upper Extremity Assessment: Overall WFL for tasks assessed    Lower Extremity Assessment Lower Extremity Assessment: RLE deficits/detail RLE Deficits / Details: good quad sets trength. B DF/PF 4+/5 RLE Sensation: decreased light touch (plantar surface B feet)    Cervical / Trunk Assessment Cervical / Trunk Assessment: Normal  Communication   Communication: No difficulties  Cognition Arousal/Alertness: Awake/alert Behavior During Therapy: WFL for tasks assessed/performed Overall Cognitive Status: Within Functional Limits for tasks assessed                                          General Comments      Exercises Total Joint Exercises Ankle Circles/Pumps: AROM, Both, 20 reps, Seated Quad Sets: AROM, Both, 15 reps, Seated Heel Slides: AROM, Right, 10 reps, Seated   Assessment/Plan    PT Assessment Patient needs continued PT services  PT Problem List Decreased strength;Decreased range of motion;Decreased activity tolerance;Decreased balance;Decreased mobility;Decreased knowledge of use of DME;Pain       PT Treatment Interventions DME instruction;Gait training;Stair training;Functional mobility training;Therapeutic activities;Therapeutic exercise;Balance training;Patient/family education    PT Goals (Current goals can be found in the Care Plan section)  Acute Rehab PT Goals Patient Stated Goal: Go home and have good recovery to spend time with grandchildren PT Goal Formulation: With patient Time For Goal Achievement: 12/19/21 Potential to Achieve Goals: Good     Frequency 7X/week     Co-evaluation               AM-PAC PT "6 Clicks" Mobility  Outcome Measure Help needed turning from your back to your side while in a flat bed without using bedrails?: A Little Help needed moving from lying on your back to sitting on the side of a flat bed without using bedrails?: A Little Help needed moving to and from a bed to a chair (including a wheelchair)?: A Little Help needed standing up from a chair using your arms (e.g., wheelchair or bedside chair)?: A Little Help needed to walk in hospital room?: A Little Help needed climbing 3-5 steps with a railing? : A Lot 6 Click Score: 17    End of Session Equipment Utilized During Treatment: Gait belt Activity Tolerance: Patient tolerated treatment well Patient left: in chair;with call bell/phone within reach Nurse Communication: Mobility status PT Visit Diagnosis: Unsteadiness on feet (R26.81);Muscle weakness (generalized) (M62.81);Pain Pain - Right/Left: Right Pain - part of body: Hip    Time: 1517-6160 PT Time Calculation (min) (ACUTE ONLY): 24 min   Charges:   PT Evaluation $PT Eval Low Complexity: 1 Low PT Treatments $Therapeutic Activity: 8-22 mins        Festus Barren., PT, DPT  Acute  Rehabilitation Services  Office [phone]   12/05/2021, 4:45 PM

## 2021-12-05 NOTE — Plan of Care (Signed)
°  Problem: Education: Goal: Knowledge of the prescribed therapeutic regimen will improve Outcome: Progressing Goal: Understanding of discharge needs will improve Outcome: Progressing   Problem: Activity: Goal: Ability to tolerate increased activity will improve Outcome: Progressing   Problem: Pain Management: Goal: Pain level will decrease with appropriate interventions Outcome: Progressing

## 2021-12-05 NOTE — Care Plan (Signed)
Ortho Bundle Case Management Note  Patient Details  Name: Brittany [name] MRN: [id number] Date of Birth: June 23, 1946                  R THA on 12-05-21 DCP: Home with husband DME: RW ordered through [place] PT: HEP   DME Arranged:  [place]ford Rile rolling DME Agency:  [employer]  HH Arranged:    [place] Agency:     Additional Comments: Please contact me with any questions of if this plan should need to change.  Marianne Sofia, RN,CCM [facility]  [phone] 12/05/2021, 10:31 AM

## 2021-12-05 NOTE — Anesthesia Preprocedure Evaluation (Addendum)
Anesthesia Evaluation  Patient identified by MRN, date of birth, ID band Patient awake    Reviewed: Allergy & Precautions, NPO status , Patient's Chart, lab work & pertinent test results  Airway Mallampati: I  TM Distance: >3 FB Neck ROM: Full    Dental  (+) Teeth Intact, Dental Advisory Given   Pulmonary neg pulmonary ROS,    breath sounds clear to auscultation       Cardiovascular negative cardio ROS   Rhythm:Regular Rate:Normal     Neuro/Psych negative neurological ROS  negative psych ROS   GI/Hepatic negative GI ROS, Neg liver ROS,   Endo/Other  negative endocrine ROS  Renal/GU negative Renal ROS     Musculoskeletal negative musculoskeletal ROS (+)   Abdominal Normal abdominal exam  (+)   Peds  Hematology negative hematology ROS (+)   Anesthesia Other Findings   Reproductive/Obstetrics                            Anesthesia Physical Anesthesia Plan  ASA: 2  Anesthesia Plan: Spinal   Post-op Pain Management:    Induction:   PONV Risk Score and Plan: 3 and Ondansetron, Propofol infusion and Dexamethasone  Airway Management Planned: Natural Airway and Simple Face Mask  Additional Equipment: None  Intra-op Plan:   Post-operative Plan:   Informed Consent: I have reviewed the patients History and Physical, chart, labs and discussed the procedure including the risks, benefits and alternatives for the proposed anesthesia with the patient or authorized representative who has indicated his/her understanding and acceptance.       Plan Discussed with: CRNA  Anesthesia Plan Comments: (Lab Results      Component                Value               Date                      WBC                      6.8                 11/22/2021                HGB                      11.9 (L)            11/25/2021                HCT                      35.0 (L)            11/25/2021                 MCV                      82.8                11/22/2021                PLT                      252                 11/22/2021           )  Anesthesia Quick Evaluation

## 2021-12-05 NOTE — Anesthesia Procedure Notes (Signed)
Spinal  Patient location during procedure: OR Start time: 12/05/2021 10:02 AM End time: 12/05/2021 10:05 AM Reason for block: surgical anesthesia Staffing Performed: resident/CRNA  Anesthesiologist: Effie Berkshire, MD Resident/CRNA: Lind Covert, CRNA Preanesthetic Checklist Completed: patient identified, IV checked, site marked, risks and benefits discussed, surgical consent, monitors and equipment checked, pre-op evaluation and timeout performed Spinal Block Patient position: sitting Prep: DuraPrep Patient monitoring: heart rate, cardiac monitor, continuous pulse ox and blood pressure Approach: midline Location: L3-4 Injection technique: single-shot Needle Needle type: Pencan  Needle gauge: 24 G Needle length: 10 cm Needle insertion depth: 7 cm Assessment Sensory level: T4 Additional Notes Timeout performed. Patient in sitting position. L3-4 identified. SAB without difficulty on 2nd attempt. Patient to supine position.

## 2021-12-05 NOTE — Interval H&P Note (Signed)
History and Physical Interval Note:  12/05/2021 9:58 AM  Brittany [name]  has presented today for surgery, with the diagnosis of Post traumatic  right hip arthritis, retained hardware right femur.  The various methods of treatment have been discussed with the patient and family. After consideration of risks, benefits and other options for treatment, the patient has consented to  Procedure(s) with comments: [place] (Right) - 150 as a surgical intervention.  The patient's history has been reviewed, patient examined, no change in status, stable for surgery.  I have reviewed the patient's chart and labs.  Questions were answered to the patient's satisfaction.     Hilton Cork Kushal Saunders

## 2021-12-05 NOTE — Op Note (Signed)
OPERATIVE REPORT  SURGEON: Rod Can, MD   ASSISTANT: Nehemiah Massed, PA-C.  PREOPERATIVE DIAGNOSIS: Posttraumatic Right hip arthritis.   POSTOPERATIVE DIAGNOSIS: Posttraumatic Right hip arthritis.   PROCEDURE: Right total hip arthroplasty, anterior approach.   IMPLANTS: Biomet Taperloc Complete Microplasty stem, size 18 x 121 mm, high offset. Biomet G7 OsseoTi Cup, size 54 mm. Biomet Vivacit-E liner, size 36 mm, F, neutral. Biomet metal head ball, size 36 - 6 mm.  ANESTHESIA:  MAC and Spinal  ESTIMATED BLOOD LOSS:-250 mL    ANTIBIOTICS: 2 g Ancef.  DRAINS: None.  COMPLICATIONS: None.   CONDITION: PACU - hemodynamically stable.   BRIEF CLINICAL NOTE: Brittany [name] is a 76 y.o. female with a long-standing history of posttraumatic Right hip arthritis with retained broken screw in the proximal femur. After failing conservative management, the patient was indicated for total hip arthroplasty. The risks, benefits, and alternatives to the procedure were explained, and the patient elected to proceed. Temporary IVC filter was placed by Dr. Donzetta Matters due to history of VTE with chronic coumadin usage.  PROCEDURE IN DETAIL: Surgical site was marked by myself in the pre-op holding area. Once inside the operating room, spinal anesthesia was obtained, and a foley catheter was inserted. The patient was then positioned on the Hana table.  All bony prominences were well padded.  The hip was prepped and draped in the normal sterile surgical fashion.  A time-out was called verifying side and site of surgery. The patient received IV antibiotics within 60 minutes of beginning the procedure.   Bikini incision was made, and superficial dissection was performed lateral to the ASIS. The direct anterior approach to the hip was performed through the Hueter interval.  Lateral femoral circumflex vessels were treated with the Auqumantys. The anterior capsule was exposed and an inverted T capsulotomy was  made. The femoral neck cut was made to the level of the templated cut.  A corkscrew was placed into the head and the head was removed.  The femoral head was found to have eburnated bone. The head was passed to the back table and was measured. Pubofemoral ligament was released off of the calcar, taking care to stay on bone. Superior capsule was released from the greater trochanter, taking care to stay lateral to the posterior border of the femoral neck in order to preserve the short external rotators.   Acetabular exposure was achieved, and the pulvinar and labrum were excised. Sequential reaming of the acetabulum was then performed up to a size 53 mm reamer. A 54 mm cup was then opened and impacted into place at approximately 40 degrees of abduction and 20 degrees of anteversion. The final polyethylene liner was impacted into place and acetabular osteophytes were removed.    I then gained femoral exposure taking care to protect the abductors and greater trochanter.  This was performed using standard external rotation, extension, and adduction.  A cookie cutter was used to enter the femoral canal, and then the femoral canal finder was placed.  Sequential broaching was performed up to a size 18.  Calcar planer was used on the femoral neck remnant.  I placed a high offset neck and a trial head ball.  The hip was reduced.  Leg lengths and offset were checked fluoroscopically.  The hip was dislocated and trial components were removed.  The final implants were placed, and the hip was reduced.  Fluoroscopy was used to confirm component position and leg lengths.  At 90 degrees of external rotation and  full extension, the hip was stable to an anterior directed force.   The wound was copiously irrigated with Irrisept solution and normal saline using pule lavage.  Marcaine solution was injected into the periarticular soft tissue.  The wound was closed in layers using #1 Vicryl and V-Loc for the fascia, 2-0 Vicryl for the  subcutaneous fat, 2-0 Monocryl for the deep dermal layer, 3-0 running Monocryl subcuticular stitch, and Dermabond for the skin.  Once the glue was fully dried, an Aquacell Ag dressing was applied.  The patient was transported to the recovery room in stable condition.  Sponge, needle, and instrument counts were correct at the end of the case x2.  The patient tolerated the procedure well and there were no known complications.  Please note that a surgical assistant was a medical necessity for this procedure to perform it in a safe and expeditious manner. Assistant was necessary to provide appropriate retraction of vital neurovascular structures, to prevent femoral fracture, and to allow for anatomic placement of the prosthesis.

## 2021-12-05 NOTE — Progress Notes (Signed)
ANTICOAGULATION CONSULT NOTE - Initial Consult  Pharmacy Consult for Warfarin Indication: Hx PE/DVT and VTE prophylaxis  No Known Allergies  Patient Measurements:   Heparin Dosing Weight:   Vital Signs: Temp: 97.4 F (36.3 C) (02/09 1352) Temp Source: Oral (02/09 1352) BP: 112/67 (02/09 1352) Pulse Rate: 58 (02/09 1352)  Labs: Recent Labs    12/05/21 0831  HGB 11.1*  HCT 36.2  PLT 240  LABPROT 13.9  INR 1.1    Estimated Creatinine Clearance: 68 mL/min (by C-G formula based on SCr of 0.6 mg/dL).   Medical History: Past Medical History:  Diagnosis Date   Acute deep vein thrombosis (DVT) of brachial vein of left upper extremity (HCC) 06/14/2018   Cancer (HCC)    Chronic back pain    History of ectopic pregnancy    History of pulmonary embolism    anticoagulated on coumadin   Personal history of chemotherapy     Medications:  Scheduled:   [START ON 12/06/2021] vitamin C  1,000 mg Oral Daily   [START ON 12/06/2021] calcium carbonate  1 tablet Oral Q breakfast   celecoxib  200 mg Oral BID   [START ON 12/06/2021] cholecalciferol  1,000 Units Oral Daily   [START ON 12/06/2021] dexamethasone (DECADRON) injection  10 mg Intravenous Once   docusate sodium  100 mg Oral BID   [START ON 12/06/2021] enoxaparin (LOVENOX) injection  40 mg Subcutaneous Q24H   [START ON 12/06/2021] magnesium oxide  200 mg Oral Daily   senna  1 tablet Oral BID   Infusions:   sodium chloride      ceFAZolin (ANCEF) IV     methocarbamol (ROBAXIN) IV      Assessment: 48 yoF admitted on 12/05/2021 for Right total hip arthroplasty, anterior approach.  Pharmacy is consulted to resume warfarin dosing for history of PE/DVT.    Her home warfarin dose was 5mg  daily on Mon, Wed, Fri, Sunday and 7.5 mg daily on Tues, Thurs, Saturday (last dose on 2/4).  She was then bridged on Enoxaparin 150 mg (~ 1.68 mg/kg) and was instructed to inject q12h starting on Sunday x3 days (2/5 - 2/7).  Intra-op: TXA 1g IV on  2/9 Postop: Enoxaparin 40mg  Reader q24h starting 2/10 8am per MD.  INR 1.1 CBC: Hgb 11.3, Plt WNL  Goal of Therapy:  INR 2-3 Monitor platelets by anticoagulation protocol: Yes   Plan:  Warfarin 8 mg PO x 1. Daily PT/INR. Monitor for signs and symptoms of bleeding. Follow up Lovenox bridge dosing.     Gretta Arab PharmD, BCPS Clinical Pharmacist [facility] main pharmacy [phone] 12/05/2021 2:30 PM

## 2021-12-05 NOTE — Transfer of Care (Signed)
Immediate Anesthesia Transfer of Care Note  Patient: Brittany [name]  Procedure(s) Performed: TOTAL HIP ARTHROPLASTY ANTERIOR APPROACH (Right: Hip)  Patient Location: PACU  Anesthesia Type:Spinal  Level of Consciousness: sedated  Airway & Oxygen Therapy: Patient Spontanous Breathing and Patient connected to face mask oxygen  Post-op Assessment: Report given to RN and Post -op Vital signs reviewed and stable  Post vital signs: Reviewed and stable  Last Vitals:  Vitals Value Taken Time  BP 107/60 12/05/21 1224  Temp    Pulse 70 12/05/21 1227  Resp 17 12/05/21 1227  SpO2 100 % 12/05/21 1227  Vitals shown include unvalidated device data.  Last Pain:  Vitals:   12/05/21 0820  TempSrc: Oral  PainSc: 1          Complications: No notable events documented.

## 2021-12-05 NOTE — Discharge Instructions (Signed)
? ?Dr. Cornelius Marullo ?Joint Replacement Specialist ?[facility] ?[address] ?[place], [place] [address] ?[phone] ? ? ?TOTAL HIP REPLACEMENT POSTOPERATIVE DIRECTIONS ? ? ? ?Hip Rehabilitation, Guidelines Following Surgery  ? ?WEIGHT BEARING ?Weight bearing as tolerated with assist device (walker, cane, etc) as directed, use it as long as suggested by your surgeon or therapist, typically at least 4-6 weeks. ? ?The results of a hip operation are greatly improved after range of motion and muscle strengthening exercises. Follow all safety measures which are given to protect your hip. If any of these exercises cause increased pain or swelling in your joint, decrease the amount until you are comfortable again. Then slowly increase the exercises. Call your caregiver if you have problems or questions.  ? ?HOME CARE INSTRUCTIONS  ?Most of the following instructions are designed to prevent the dislocation of your new hip.  ?Remove items at home which could result in a fall. This includes throw rugs or furniture in walking pathways.  ?Continue medications as instructed at time of discharge. ?You may have some home medications which will be placed on hold until you complete the course of blood thinner medication. ?You may start showering once you are discharged home. Do not remove your dressing. ?Do not put on socks or shoes without following the instructions of your caregivers.   ?Sit on chairs with arms. Use the chair arms to help push yourself up when arising.  ?Arrange for the use of a toilet seat elevator so you are not sitting low.  ?Walk with walker as instructed.  ?You may resume a sexual relationship in one month or when given the OK by your caregiver.  ?Use walker as long as suggested by your caregivers.  ?You may put full weight on your legs and walk as much as is comfortable. ?Avoid periods of inactivity such as sitting longer than an hour when not asleep. This helps prevent blood  clots.  ?You may return to work once you are cleared by your surgeon.  ?Do not drive a car for 6 weeks or until released by your surgeon.  ?Do not drive while taking narcotics.  ?Wear elastic stockings for two weeks following surgery during the day but you may remove then at night.  ?Make sure you keep all of your appointments after your operation with all of your doctors and caregivers. You should call the office at the above phone number and make an appointment for approximately two weeks after the date of your surgery. ?Please pick up a stool softener and laxative for home use as long as you are requiring pain medications. ?ICE to the affected hip every three hours for 30 minutes at a time and then as needed for pain and swelling. Continue to use ice on the hip for pain and swelling from surgery. You may notice swelling that will progress down to the foot and ankle.  This is normal after surgery.  Elevate the leg when you are not up walking on it.   ?It is important for you to complete the blood thinner medication as prescribed by your doctor. ?Continue to use the breathing machine which will help keep your temperature down.  It is common for your temperature to cycle up and down following surgery, especially at night when you are not up moving around and exerting yourself.  The breathing machine keeps your lungs expanded and your temperature down. ? ?RANGE OF MOTION AND STRENGTHENING EXERCISES  ?These exercises are designed to help you   keep full movement of your hip joint. Follow your caregiver's or physical therapist's instructions. Perform all exercises about fifteen times, three times per day or as directed. Exercise both hips, even if you have had only one joint replacement. These exercises can be done on a training (exercise) mat, on the floor, on a table or on a bed. Use whatever works the best and is most comfortable for you. Use music or television while you are exercising so that the exercises are a  pleasant break in your day. This will make your life better with the exercises acting as a break in routine you can look forward to.  ?Lying on your back, slowly slide your foot toward your buttocks, raising your knee up off the floor. Then slowly slide your foot back down until your leg is straight again.  ?Lying on your back spread your legs as far apart as you can without causing discomfort.  ?Lying on your side, raise your upper leg and foot straight up from the floor as far as is comfortable. Slowly lower the leg and repeat.  ?Lying on your back, tighten up the muscle in the front of your thigh (quadriceps muscles). You can do this by keeping your leg straight and trying to raise your heel off the floor. This helps strengthen the largest muscle supporting your knee.  ?Lying on your back, tighten up the muscles of your buttocks both with the legs straight and with the knee bent at a comfortable angle while keeping your heel on the floor.  ? ?SKILLED REHAB INSTRUCTIONS: ?If the patient is transferred to a skilled rehab facility following release from the hospital, a list of the current medications will be sent to the facility for the patient to continue.  When discharged from the skilled rehab facility, please have the facility set up the patient's Home Health Physical Therapy prior to being released. Also, the skilled facility will be responsible for providing the patient with their medications at time of release from the facility to include their pain medication and their blood thinner medication. If the patient is still at the rehab facility at time of the two week follow up appointment, the skilled rehab facility will also need to assist the patient in arranging follow up appointment in our office and any transportation needs. ? ?POST-OPERATIVE OPIOID TAPER INSTRUCTIONS: ?It is important to wean off of your opioid medication as soon as possible. If you do not need pain medication after your surgery it is ok  to stop day one. ?Opioids include: ?Codeine, Hydrocodone(Norco, Vicodin), Oxycodone(Percocet, oxycontin) and hydromorphone amongst others.  ?Long term and even short term use of opiods can cause: ?Increased pain response ?Dependence ?Constipation ?Depression ?Respiratory depression ?And more.  ?Withdrawal symptoms can include ?Flu like symptoms ?Nausea, vomiting ?And more ?Techniques to manage these symptoms ?Hydrate well ?Eat regular healthy meals ?Stay active ?Use relaxation techniques(deep breathing, meditating, yoga) ?Do Not substitute Alcohol to help with tapering ?If you have been on opioids for less than two weeks and do not have pain than it is ok to stop all together.  ?Plan to wean off of opioids ?This plan should start within one week post op of your joint replacement. ?Maintain the same interval or time between taking each dose and first decrease the dose.  ?Cut the total daily intake of opioids by one tablet each day ?Next start to increase the time between doses. ?The last dose that should be eliminated is the evening dose.  ? ? ?MAKE   SURE YOU:  ?Understand these instructions.  ?Will watch your condition.  ?Will get help right away if you are not doing well or get worse. ? ?Pick up stool softner and laxative for home use following surgery while on pain medications. ?Do not remove your dressing. ?The dressing is waterproof--it is OK to take showers. ?Continue to use ice for pain and swelling after surgery. ?Do not use any lotions or creams on the incision until instructed by your surgeon. ?Total Hip Protocol. ? ?

## 2021-12-05 NOTE — Anesthesia Postprocedure Evaluation (Signed)
Anesthesia Post Note  Patient: Brittany [name]  Procedure(s) Performed: TOTAL HIP ARTHROPLASTY ANTERIOR APPROACH (Right: Hip)     Patient location during evaluation: PACU Anesthesia Type: Spinal Level of consciousness: oriented and awake and alert Pain management: pain level controlled Vital Signs Assessment: post-procedure vital signs reviewed and stable Respiratory status: spontaneous breathing, respiratory function stable and patient connected to nasal cannula oxygen Cardiovascular status: blood pressure returned to baseline and stable Postop Assessment: no headache, no backache and no apparent nausea or vomiting Anesthetic complications: no   No notable events documented.  Last Vitals:  Vitals:   12/05/21 1330 12/05/21 1352  BP: 100/64 112/67  Pulse: 62 (!) 58  Resp: 12 16  Temp:  (!) 36.3 C  SpO2: 98% 99%    Last Pain:  Vitals:   12/05/21 1352  TempSrc: Oral  PainSc:                  Effie Berkshire

## 2021-12-06 DIAGNOSIS — Z6832 Body mass index (BMI) 32.0-32.9, adult: Secondary | ICD-10-CM | POA: Diagnosis not present

## 2021-12-06 DIAGNOSIS — Z20822 Contact with and (suspected) exposure to covid-19: Secondary | ICD-10-CM | POA: Diagnosis not present

## 2021-12-06 DIAGNOSIS — M1651 Unilateral post-traumatic osteoarthritis, right hip: Secondary | ICD-10-CM | POA: Diagnosis not present

## 2021-12-06 DIAGNOSIS — T1490XS Injury, unspecified, sequela: Secondary | ICD-10-CM | POA: Diagnosis not present

## 2021-12-06 DIAGNOSIS — E669 Obesity, unspecified: Secondary | ICD-10-CM | POA: Diagnosis not present

## 2021-12-06 LAB — BASIC METABOLIC PANEL
Anion gap: 4 — ABNORMAL LOW (ref 5–15)
BUN: 16 mg/dL (ref 8–23)
CO2: 23 mmol/L (ref 22–32)
Calcium: 8.3 mg/dL — ABNORMAL LOW (ref 8.9–10.3)
Chloride: 112 mmol/L — ABNORMAL HIGH (ref 98–111)
Creatinine, Ser: 0.6 mg/dL (ref 0.44–1.00)
GFR, Estimated: >60 mL/min (ref >60)
Glucose, Bld: 135 mg/dL — ABNORMAL HIGH (ref 70–99)
Potassium: 3.9 mmol/L (ref 3.5–5.1)
Sodium: 139 mmol/L (ref 135–145)

## 2021-12-06 LAB — CBC
HCT: 31.5 % — ABNORMAL LOW (ref 36.0–46.0)
Hemoglobin: 9.7 g/dL — ABNORMAL LOW (ref 12.0–15.0)
MCH: 25.8 pg — ABNORMAL LOW (ref 26.0–34.0)
MCHC: 30.8 g/dL (ref 30.0–36.0)
MCV: 83.8 fL (ref 80.0–100.0)
Platelets: 194 10*3/uL (ref 150–400)
RBC: 3.76 MIL/uL — ABNORMAL LOW (ref 3.87–5.11)
RDW: 14.6 % (ref 11.5–15.5)
WBC: 10.6 10*3/uL — ABNORMAL HIGH (ref 4.0–10.5)
nRBC: 0 % (ref 0.0–0.2)

## 2021-12-06 LAB — PROTIME-INR
INR: 1.2 (ref 0.8–1.2)
Prothrombin Time: 14.9 seconds (ref 11.4–15.2)

## 2021-12-06 MED ORDER — METHOCARBAMOL 500 MG PO TABS: 500.0000 mg | ORAL_TABLET | Freq: Four times a day (QID) | ORAL | 0 refills | Status: DC | PRN

## 2021-12-06 MED ORDER — ONDANSETRON HCL 4 MG PO TABS: 4.0000 mg | ORAL_TABLET | Freq: Four times a day (QID) | ORAL | 0 refills | Status: DC | PRN

## 2021-12-06 MED ORDER — HYDROCODONE-ACETAMINOPHEN 5-325 MG PO TABS: 1.0000 | ORAL_TABLET | ORAL | 0 refills | Status: DC | PRN

## 2021-12-06 MED ORDER — DOCUSATE SODIUM 100 MG PO CAPS: 100.0000 mg | ORAL_CAPSULE | Freq: Two times a day (BID) | ORAL | 1 refills | Status: AC

## 2021-12-06 MED ORDER — SENNA 8.6 MG PO TABS: 1.0000 | ORAL_TABLET | Freq: Two times a day (BID) | ORAL | 0 refills | Status: AC

## 2021-12-06 MED ORDER — POLYETHYLENE GLYCOL 3350 17 G PO PACK: 17.0000 g | PACK | Freq: Every day | ORAL | 0 refills | Status: DC | PRN

## 2021-12-06 MED ORDER — ENOXAPARIN SODIUM 40 MG/0.4ML IJ SOSY
80.0000 mg | PREFILLED_SYRINGE | Freq: Two times a day (BID) | INTRAMUSCULAR | 0 refills | Status: DC
Start: 2021-12-06 — End: 2022-03-13

## 2021-12-06 MED ORDER — WARFARIN SODIUM 4 MG PO TABS
8.0000 mg | ORAL_TABLET | Freq: Once | ORAL | Status: DC
  Filled 2021-12-06: qty 2

## 2021-12-06 NOTE — Progress Notes (Signed)
° ° °  Subjective:  Patient reports pain as mild.  Denies N/V/CP/SOB. No c/o.  Objective:   VITALS:   Vitals:   12/05/21 1352 12/05/21 2102 12/06/21 0208 12/06/21 0434  BP: 112/67 (!) 134/58 (!) 132/56 (!) 132/57  Pulse: (!) 58 77 83 73  Resp: 16 17 16 17   Temp: (!) 97.4 F (36.3 C) 97.8 F (36.6 C) 97.9 F (36.6 C) (!) 97.4 F (36.3 C)  TempSrc: Oral Oral Oral Oral  SpO2: 99% 97% 98% 96%  Weight:  88.9 kg    Height:  5\' 5"  (1.651 m)      NAD ABD soft Sensation intact distally Intact pulses distally Dorsiflexion/Plantar flexion intact Incision: dressing C/D/I Compartment soft   Lab Results  Component Value Date   WBC 10.6 (H) 12/06/2021   HGB 9.7 (L) 12/06/2021   HCT 31.5 (L) 12/06/2021   MCV 83.8 12/06/2021   PLT 194 12/06/2021   BMET    Component Value Date/Time   NA 139 12/06/2021 0317   K 3.9 12/06/2021 0317   CL 112 (H) 12/06/2021 0317   CO2 23 12/06/2021 0317   GLUCOSE 135 (H) 12/06/2021 0317   BUN 16 12/06/2021 0317   CREATININE 0.60 12/06/2021 0317   CREATININE 0.83 12/28/2017 0854   CALCIUM 8.3 (L) 12/06/2021 0317   GFRNONAA >60 12/06/2021 0317   GFRNONAA >60 12/28/2017 0854     Assessment/Plan: 1 Day Post-Op   Principal Problem:   Traumatic arthritis of right hip Active Problems:   Osteoarthritis of right hip   WBAT with walker DVT ppx: Lovenox and Coumadin, SCDs, TEDS PO pain control PT/OT Dispo: D/C home after clears PT, f/u PCP on Monday for INR check   Hilton Cork Haileigh Pitz 12/06/2021, 7:46 AM   Rod Can, MD [phone] [facility] now [facility]   [place] [address], [address], [place], [place] [address] Phone: [phone] [email or web address] Facebook   [employer]

## 2021-12-06 NOTE — TOC Transition Note (Signed)
Transition of Care [facility]) - CM/SW Discharge Note   Patient Details  Name: SANJUANA MRUK MRN: [id number] Date of Birth: 1945-10-28  Transition of Care [facility]) CM/SW Contact:  Lennart Pall, LCSW Phone Number: 12/06/2021, 10:00 AM   Clinical Narrative:    Met briefly with pt and confirming she has received rw via [place].  Plan for HEP.  No TOC needs.   Final next level of care: Home/Self Care Barriers to Discharge: No Barriers Identified   Patient Goals and CMS Choice Patient states their goals for this hospitalization and ongoing recovery are:: return home      Discharge Placement                       Discharge Plan and Services                DME Arranged: Walker rolling DME Agency: [place]                  Social Determinants of Health (SDOH) Interventions     Readmission Risk Interventions No flowsheet data found.

## 2021-12-06 NOTE — Progress Notes (Signed)
[place] for Warfarin Indication: Hx PE/DVT and VTE prophylaxis  No Known Allergies  Patient Measurements: Height: 5\' 5"  (165.1 cm) Weight: 88.9 kg (196 lb) IBW/kg (Calculated) : 57 Heparin Dosing Weight:   Vital Signs: Temp: 97.4 F (36.3 C) (02/10 0434) Temp Source: Oral (02/10 0434) BP: 132/57 (02/10 0434) Pulse Rate: 73 (02/10 0434)  Labs: Recent Labs    12/05/21 0831 12/05/21 1442 12/06/21 0317  HGB 11.1* 11.3* 9.7*  HCT 36.2 36.7 31.5*  PLT 240 233 194  LABPROT 13.9  --  14.9  INR 1.1  --  1.2  CREATININE  --  0.77 0.60     Estimated Creatinine Clearance: 68 mL/min (by C-G formula based on SCr of 0.6 mg/dL).   Medical History: Past Medical History:  Diagnosis Date   Acute deep vein thrombosis (DVT) of brachial vein of left upper extremity (HCC) 06/14/2018   Cancer (HCC)    Chronic back pain    History of ectopic pregnancy    History of pulmonary embolism    anticoagulated on coumadin   Personal history of chemotherapy     Medications:  Scheduled:   vitamin C  1,000 mg Oral Daily   calcium carbonate  1 tablet Oral Q breakfast   celecoxib  200 mg Oral BID   cholecalciferol  1,000 Units Oral Daily   dexamethasone (DECADRON) injection  10 mg Intravenous Once   docusate sodium  100 mg Oral BID   enoxaparin (LOVENOX) injection  40 mg Subcutaneous Q24H   magnesium oxide  200 mg Oral Daily   senna  1 tablet Oral BID   Warfarin - Pharmacist Dosing Inpatient   Does not apply q1600   Infusions:   sodium chloride 150 mL/hr at 12/05/21 1559   methocarbamol (ROBAXIN) IV      Assessment: 89 yoF admitted on 12/06/2021 for Right total hip arthroplasty, anterior approach.  Pharmacy is consulted to resume warfarin dosing for history of PE/DVT.    Her home warfarin dose was 5mg  daily on Mon, Wed, Fri, Sunday and 7.5 mg daily on Tues, Thurs, Saturday (last dose on 2/4).  She was then bridged on Enoxaparin 150 mg (~ 1.68  mg/kg) and was instructed to inject q12h starting on Sunday x3 days (2/5 - 2/7).  Intra-op: TXA 1g IV on 2/9 Postop: Enoxaparin 40mg  [place] q24h starting 2/10 8am per MD.   Today 12/06/21  INR 1.2 CBC: Hgb 9.7, Plt WNL  Goal of Therapy:  INR 2-3 Monitor platelets by anticoagulation protocol: Yes   Plan:  Warfarin 8 mg PO x 1. Lovenox 40 mg daily until INR therapeutic with > or = 5 day overlap Daily PT/INR. Monitor for signs and symptoms of bleeding.  Ulice Dash, PharmD 12/06/2021 9:48 AM

## 2021-12-06 NOTE — Progress Notes (Signed)
Physical Therapy Treatment Patient Details Name: Brittany [name] MRN: [id number] DOB: 12/04/1945 Today's Date: 12/06/2021   History of Present Illness Pt is a 76 y.o. female s/p Rt THA on 12/05/2021. PMH significant for chronic back pain, PE, DVT, and breast cancer.    PT Comments    Pt continues very motivated and progressing well with mobility.  Pt up to ambulate increased distance in hall, negotiated stairs, reviewed written HEP with progression, reviewed car transfers and finished up in bathroom.  [name] present for session and written instruction for curb and HEP provided.  Pt eager for dc home this date.   Recommendations for follow up therapy are one component of a multi-disciplinary discharge planning process, led by the attending physician.  Recommendations may be updated based on patient status, additional functional criteria and insurance authorization.  Follow Up Recommendations  Follow physician's recommendations for discharge plan and follow up therapies     Assistance Recommended at Discharge Frequent or constant Supervision/Assistance  Patient can return home with the following A little help with walking and/or transfers;A little help with bathing/dressing/bathroom;Assistance with cooking/housework;Assist for transportation;Help with stairs or ramp for entrance   Equipment Recommendations  Rolling walker (2 wheels)    Recommendations for Other Services       Precautions / Restrictions Precautions Precautions: Fall Restrictions Weight Bearing Restrictions: No Other Position/Activity Restrictions: WBAT     Mobility  Bed Mobility Overal bed mobility: Needs Assistance Bed Mobility: Supine to Sit     Supine to sit: Supervision, HOB elevated     General bed mobility comments: Pt in recliner on arrival and in bathroom at session end    Transfers Overall transfer level: Needs assistance Equipment used: Rolling walker (2 wheels) Transfers: Sit to/from  Stand Sit to Stand: Min guard, Supervision           General transfer comment: cues for LE management and use of UEs to self assist    Ambulation/Gait Ambulation/Gait assistance: Min guard, Supervision Gait Distance (Feet): 150 Feet Assistive device: Rolling walker (2 wheels) Gait Pattern/deviations: Step-to pattern, Step-through pattern, Decreased step length - right, Decreased step length - left, Shuffle, Trunk flexed Gait velocity: decr     General Gait Details: cues for posture, position from RW and to increase ER on R   Stairs Stairs: Yes Stairs assistance: Min assist Stair Management: No rails, Step to pattern, Backwards, Forwards, With walker Number of Stairs: 4 General stair comments: single step twice fwd and twice bkwd with cues for sequence and foot/RW placement   Wheelchair Mobility    Modified Rankin (Stroke Patients Only)       Balance Overall balance assessment: Needs assistance Sitting-balance support: Feet supported Sitting balance-Leahy Scale: Good     Standing balance support: No upper extremity supported Standing balance-Leahy Scale: Fair                              Cognition Arousal/Alertness: Awake/alert Behavior During Therapy: WFL for tasks assessed/performed Overall Cognitive Status: Within Functional Limits for tasks assessed                                          Exercises Total Joint Exercises Ankle Circles/Pumps: AROM, Both, 20 reps, Seated Quad Sets: AROM, Both, 10 reps, Supine Heel Slides: Right, AAROM, 20 reps, Supine Hip ABduction/ADduction: AAROM, Right,  15 reps, Supine Long Arc Quad: AROM, Right, 10 reps, Seated    General Comments        Pertinent Vitals/Pain Pain Assessment Pain Assessment: 0-10 Pain Score: 2  Pain Location: R hip Pain Descriptors / Indicators: Aching, Sore Pain Intervention(s): Limited activity within patient's tolerance, Monitored during session, Premedicated  before session    Home Living                          Prior Function            PT Goals (current goals can now be found in the care plan section) Acute Rehab PT Goals Patient Stated Goal: Go home and have good recovery to spend time with grandchildren PT Goal Formulation: With patient Time For Goal Achievement: 12/19/21 Potential to Achieve Goals: Good Progress towards PT goals: Progressing toward goals    Frequency    7X/week      PT Plan Current plan remains appropriate    Co-evaluation              AM-PAC PT "6 Clicks" Mobility   Outcome Measure  Help needed turning from your back to your side while in a flat bed without using bedrails?: A Little Help needed moving from lying on your back to sitting on the side of a flat bed without using bedrails?: A Little Help needed moving to and from a bed to a chair (including a wheelchair)?: A Little Help needed standing up from a chair using your arms (e.g., wheelchair or bedside chair)?: A Little Help needed to walk in hospital room?: A Little Help needed climbing 3-5 steps with a railing? : A Little 6 Click Score: 18    End of Session Equipment Utilized During Treatment: Gait belt Activity Tolerance: Patient tolerated treatment well Patient left: Other (comment) (bathroom) Nurse Communication: Mobility status PT Visit Diagnosis: Unsteadiness on feet (R26.81);Muscle weakness (generalized) (M62.81);Pain Pain - Right/Left: Right Pain - part of body: Hip     Time: 0623-7628 PT Time Calculation (min) (ACUTE ONLY): 27 min  Charges:  $Gait Training: 8-22 mins $Therapeutic Exercise: 8-22 mins $Therapeutic Activity: 8-22 mins                     [place] Pager [phone] Office [phone]    Brittany [name] 12/06/2021, 11:46 AM

## 2021-12-06 NOTE — Progress Notes (Signed)
Physical Therapy Treatment Patient Details Name: Brittany [name] MRN: [id number] DOB: 29-Jul-1946 Today's Date: 12/06/2021   History of Present Illness Pt is a 76 y.o. female s/p Rt THA on 12/05/2021. PMH significant for chronic back pain, PE, DVT, and breast cancer.    PT Comments    Pt very motivated and progressing well with mobility.  Pt hopeful for dc home this date.   Recommendations for follow up therapy are one component of a multi-disciplinary discharge planning process, led by the attending physician.  Recommendations may be updated based on patient status, additional functional criteria and insurance authorization.  Follow Up Recommendations  Follow physician's recommendations for discharge plan and follow up therapies     Assistance Recommended at Discharge Frequent or constant Supervision/Assistance  Patient can return home with the following A little help with walking and/or transfers;A little help with bathing/dressing/bathroom;Assistance with cooking/housework;Assist for transportation;Help with stairs or ramp for entrance   Equipment Recommendations  Rolling walker (2 wheels)    Recommendations for Other Services       Precautions / Restrictions Precautions Precautions: Fall Restrictions Weight Bearing Restrictions: No Other Position/Activity Restrictions: WBAT     Mobility  Bed Mobility Overal bed mobility: Needs Assistance Bed Mobility: Supine to Sit     Supine to sit: Supervision, HOB elevated     General bed mobility comments: cues for sequence and use of L LE to self assist    Transfers Overall transfer level: Needs assistance Equipment used: Rolling walker (2 wheels) Transfers: Sit to/from Stand Sit to Stand: Min guard           General transfer comment: cues for LE management and use of UEs to self assist    Ambulation/Gait Ambulation/Gait assistance: Min assist, Min guard Gait Distance (Feet): 100 Feet Assistive device: Rolling  walker (2 wheels) Gait Pattern/deviations: Step-to pattern, Step-through pattern, Decreased step length - right, Decreased step length - left, Shuffle, Trunk flexed Gait velocity: decr     General Gait Details: cues for posture, position from RW and to increase ER on R   Stairs             Wheelchair Mobility    Modified Rankin (Stroke Patients Only)       Balance Overall balance assessment: Needs assistance Sitting-balance support: Feet supported Sitting balance-Leahy Scale: Good     Standing balance support: No upper extremity supported Standing balance-Leahy Scale: Fair                              Cognition Arousal/Alertness: Awake/alert Behavior During Therapy: WFL for tasks assessed/performed Overall Cognitive Status: Within Functional Limits for tasks assessed                                          Exercises Total Joint Exercises Ankle Circles/Pumps: AROM, Both, 20 reps, Seated Quad Sets: AROM, Both, 10 reps, Supine Heel Slides: Right, AAROM, 20 reps, Supine Hip ABduction/ADduction: AAROM, Right, 15 reps, Supine Long Arc Quad: AROM, Right, 10 reps, Seated    General Comments        Pertinent Vitals/Pain Pain Assessment Pain Assessment: 0-10 Pain Score: 2  Pain Location: R hip Pain Descriptors / Indicators: Aching, Sore Pain Intervention(s): Limited activity within patient's tolerance, Monitored during session, Premedicated before session, Ice applied    Home Living  Prior Function            PT Goals (current goals can now be found in the care plan section) Acute Rehab PT Goals Patient Stated Goal: Go home and have good recovery to spend time with grandchildren PT Goal Formulation: With patient Time For Goal Achievement: 12/19/21 Potential to Achieve Goals: Good Progress towards PT goals: Progressing toward goals    Frequency    7X/week      PT Plan Current plan  remains appropriate    Co-evaluation              AM-PAC PT "6 Clicks" Mobility   Outcome Measure  Help needed turning from your back to your side while in a flat bed without using bedrails?: A Little Help needed moving from lying on your back to sitting on the side of a flat bed without using bedrails?: A Little Help needed moving to and from a bed to a chair (including a wheelchair)?: A Little Help needed standing up from a chair using your arms (e.g., wheelchair or bedside chair)?: A Little Help needed to walk in hospital room?: A Little Help needed climbing 3-5 steps with a railing? : A Lot 6 Click Score: 17    End of Session Equipment Utilized During Treatment: Gait belt Activity Tolerance: Patient tolerated treatment well Patient left: in chair;with call bell/phone within reach Nurse Communication: Mobility status PT Visit Diagnosis: Unsteadiness on feet (R26.81);Muscle weakness (generalized) (M62.81);Pain Pain - Right/Left: Right Pain - part of body: Hip     Time: 9826-4158 PT Time Calculation (min) (ACUTE ONLY): 37 min  Charges:  $Gait Training: 8-22 mins $Therapeutic Exercise: 8-22 mins                     [place] Pager [phone] Office [phone]    Zasha Belleau 12/06/2021, 11:42 AM

## 2021-12-06 NOTE — Progress Notes (Signed)
° ° °  Subjective:  Patient reports pain as mild.  Denies N/V/CP/SOB. No c/o.  Objective:   VITALS:   Vitals:   12/05/21 2102 12/06/21 0208 12/06/21 0434 12/06/21 1011  BP: (!) 134/58 (!) 132/56 (!) 132/57 (!) 113/51  Pulse: 77 83 73 76  Resp: 17 16 17 18   Temp: 97.8 F (36.6 C) 97.9 F (36.6 C) (!) 97.4 F (36.3 C) 97.8 F (36.6 C)  TempSrc: Oral Oral Oral   SpO2: 97% 98% 96% 98%  Weight: 88.9 kg     Height: 5\' 5"  (1.651 m)       NAD ABD soft Sensation intact distally Intact pulses distally Dorsiflexion/Plantar flexion intact Incision: dressing C/D/I Compartment soft   Lab Results  Component Value Date   WBC 10.6 (H) 12/06/2021   HGB 9.7 (L) 12/06/2021   HCT 31.5 (L) 12/06/2021   MCV 83.8 12/06/2021   PLT 194 12/06/2021   BMET    Component Value Date/Time   NA 139 12/06/2021 0317   K 3.9 12/06/2021 0317   CL 112 (H) 12/06/2021 0317   CO2 23 12/06/2021 0317   GLUCOSE 135 (H) 12/06/2021 0317   BUN 16 12/06/2021 0317   CREATININE 0.60 12/06/2021 0317   CREATININE 0.83 12/28/2017 0854   CALCIUM 8.3 (L) 12/06/2021 0317   GFRNONAA >60 12/06/2021 0317   GFRNONAA >60 12/28/2017 0854     Assessment/Plan: 1 Day Post-Op   Principal Problem:   Traumatic arthritis of right hip Active Problems:   Osteoarthritis of right hip   WBAT with walker DVT ppx: Lovenox and Coumadin, SCDs, TEDS PO pain control PT/OT Dispo: D/C home, INR check on Monday at PCP   [place] 12/06/2021, 10:54 AM   Rod Can, MD [phone] [place] is now [facility]   [place] [address], [address], [place], [place] [address] Phone: [phone] [email or web address]   [employer]

## 2021-12-06 NOTE — Discharge Summary (Signed)
Physician Discharge Summary  Patient ID: Brittany [name] MRN: [id number] DOB/AGE: 1946/03/05 76 y.o.  Admit date: 12/05/2021 Discharge date: 12/06/2021  Admission Diagnoses:  Traumatic arthritis of right hip  Discharge Diagnoses:  Principal Problem:   Traumatic arthritis of right hip Active Problems:   Osteoarthritis of right hip   Past Medical History:  Diagnosis Date   Acute deep vein thrombosis (DVT) of brachial vein of left upper extremity (HCC) 06/14/2018   Cancer (HCC)    Chronic back pain    History of ectopic pregnancy    History of pulmonary embolism    anticoagulated on coumadin   Personal history of chemotherapy     Surgeries: Procedure(s): TOTAL HIP ARTHROPLASTY ANTERIOR APPROACH on 12/05/2021   Consultants (if any):   Discharged Condition: Improved  Hospital Course: Brittany [name] is an 76 y.o. female who was admitted 12/05/2021 with a diagnosis of Traumatic arthritis of right hip and went to the operating room on 12/05/2021 and underwent the above named procedures.    She was given perioperative antibiotics:  Anti-infectives (From admission, onward)    Start     Dose/Rate Route Frequency Ordered Stop   12/05/21 1600  ceFAZolin (ANCEF) IVPB 2g/100 mL premix        2 g 200 mL/hr over 30 Minutes Intravenous Every 6 hours 12/05/21 1357 12/05/21 2124   12/05/21 0800  ceFAZolin (ANCEF) IVPB 2g/100 mL premix        2 g 200 mL/hr over 30 Minutes Intravenous On call to O.R. 12/05/21 0757 12/05/21 1006     .  She was given sequential compression devices, early ambulation, and Coumadin with Lovenox bridge for DVT prophylaxis.  She benefited maximally from the hospital stay and there were no complications.    Recent vital signs:  Vitals:   12/06/21 0434 12/06/21 1011  BP: (!) 132/57 (!) 113/51  Pulse: 73 76  Resp: 17 18  Temp: (!) 97.4 F (36.3 C) 97.8 F (36.6 C)  SpO2: 96% 98%    Recent laboratory studies:  Lab Results  Component Value Date   HGB 9.7  (L) 12/06/2021   HGB 11.3 (L) 12/05/2021   HGB 11.1 (L) 12/05/2021   Lab Results  Component Value Date   WBC 10.6 (H) 12/06/2021   PLT 194 12/06/2021   Lab Results  Component Value Date   INR 1.2 12/06/2021   Lab Results  Component Value Date   NA 139 12/06/2021   K 3.9 12/06/2021   CL 112 (H) 12/06/2021   CO2 23 12/06/2021   BUN 16 12/06/2021   CREATININE 0.60 12/06/2021   GLUCOSE 135 (H) 12/06/2021     WEIGHT BEARING   Weight bearing as tolerated with assist device (walker, cane, etc) as directed, use it as long as suggested by your surgeon or therapist, typically at least 4-6 weeks.   EXERCISES  Results after joint replacement surgery are often greatly improved when you follow the exercise, range of motion and muscle strengthening exercises prescribed by your doctor. Safety measures are also important to protect the joint from further injury. Any time any of these exercises cause you to have increased pain or swelling, decrease what you are doing until you are comfortable again and then slowly increase them. If you have problems or questions, call your caregiver or physical therapist for advice.   Rehabilitation is important following a joint replacement. After just a few days of immobilization, the muscles of the leg can become weakened and shrink (atrophy).  These exercises are designed to build up the tone and strength of the thigh and leg muscles and to improve motion. Often times heat used for twenty to thirty minutes before working out will loosen up your tissues and help with improving the range of motion but do not use heat for the first two weeks following surgery (sometimes heat can increase post-operative swelling).   These exercises can be done on a training (exercise) mat, on the floor, on a table or on a bed. Use whatever works the best and is most comfortable for you.    Use music or television while you are exercising so that the exercises are a pleasant break  in your day. This will make your life better with the exercises acting as a break in your routine that you can look forward to.   Perform all exercises about fifteen times, three times per day or as directed.  You should exercise both the operative leg and the other leg as well.  Exercises include:   Quad Sets - Tighten up the muscle on the front of the thigh (Quad) and hold for 5-10 seconds.   Straight Leg Raises - With your knee straight (if you were given a brace, keep it on), lift the leg to 60 degrees, hold for 3 seconds, and slowly lower the leg.  Perform this exercise against resistance later as your leg gets stronger.  Leg Slides: Lying on your back, slowly slide your foot toward your buttocks, bending your knee up off the floor (only go as far as is comfortable). Then slowly slide your foot back down until your leg is flat on the floor again.  Angel Wings: Lying on your back spread your legs to the side as far apart as you can without causing discomfort.  Hamstring Strength:  Lying on your back, push your heel against the floor with your leg straight by tightening up the muscles of your buttocks.  Repeat, but this time bend your knee to a comfortable angle, and push your heel against the floor.  You may put a pillow under the heel to make it more comfortable if necessary.   A rehabilitation program following joint replacement surgery can speed recovery and prevent re-injury in the future due to weakened muscles. Contact your doctor or a physical therapist for more information on knee rehabilitation.    CONSTIPATION  Constipation is defined medically as fewer than three stools per week and severe constipation as less than one stool per week.  Even if you have a regular bowel pattern at home, your normal regimen is likely to be disrupted due to multiple reasons following surgery.  Combination of anesthesia, postoperative narcotics, change in appetite and fluid intake all can affect your bowels.    YOU MUST use at least one of the following options; they are listed in order of increasing strength to get the job done.  They are all available over the counter, and you may need to use some, POSSIBLY even all of these options:    Drink plenty of fluids (prune juice may be helpful) and high fiber foods Colace 100 mg by mouth twice a day  Senokot for constipation as directed and as needed Dulcolax (bisacodyl), take with full glass of water  Miralax (polyethylene glycol) once or twice a day as needed.  If you have tried all these things and are unable to have a bowel movement in the first 3-4 days after surgery call either your surgeon or your primary  doctor.    If you experience loose stools or diarrhea, hold the medications until you stool forms back up.  If your symptoms do not get better within 1 week or if they get worse, check with your doctor.  If you experience "the worst abdominal pain ever" or develop nausea or vomiting, please contact the office immediately for further recommendations for treatment.   ITCHING:  If you experience itching with your medications, try taking only a single pain pill, or even half a pain pill at a time.  You can also use Benadryl over the counter for itching or also to help with sleep.   TED HOSE STOCKINGS:  Use stockings on both legs until for at least 2 weeks or as directed by physician office. They may be removed at night for sleeping.  MEDICATIONS:  See your medication summary on the After Visit Summary that nursing will review with you.  You may have some home medications which will be placed on hold until you complete the course of blood thinner medication.  It is important for you to complete the blood thinner medication as prescribed.  PRECAUTIONS:  If you experience chest pain or shortness of breath - call 911 immediately for transfer to the hospital emergency department.   If you develop a fever greater that 101 F, purulent drainage from wound,  increased redness or drainage from wound, foul odor from the wound/dressing, or calf pain - CONTACT YOUR SURGEON.                                                   FOLLOW-UP APPOINTMENTS:  If you do not already have a post-op appointment, please call the office for an appointment to be seen by your surgeon.  Guidelines for how soon to be seen are listed in your After Visit Summary, but are typically between 1-4 weeks after surgery.  OTHER INSTRUCTIONS:   Knee Replacement:  Do not place pillow under knee, focus on keeping the knee straight while resting. CPM instructions: 0-90 degrees, 2 hours in the morning, 2 hours in the afternoon, and 2 hours in the evening. Place foam block, curve side up under heel at all times except when in CPM or when walking.  DO NOT modify, tear, cut, or change the foam block in any way.   MAKE SURE YOU:  Understand these instructions.  Get help right away if you are not doing well or get worse.    Thank you for letting us be a part of your medical care team.  It is a privilege we respect greatly.  We hope these instructions will help you stay on track for a fast and full recovery!   Diagnostic Studies: DG Pelvis Portable  Result Date: 12/05/2021 CLINICAL DATA:  Right hip arthroplasty EXAM: PORTABLE PELVIS 1-2 VIEWS COMPARISON:  CT done on 11/29/2021 FINDINGS: There is interval right hip arthroplasty. Recent fracture is seen. There is surgical screw in the proximal shaft of right femur which was also seen in the previous CT. This most likely is residual from previous intervention. There are pockets of air in the soft tissues. IMPRESSION: Status post right hip arthroplasty. Electronically Signed   By: Elmer Picker M.D.   On: 12/05/2021 12:53   CT HIP RIGHT WO CONTRAST  Result Date: 12/01/2021 CLINICAL DATA:  Pain in right hip M25.551 (  ICD-10-CM) EXAM: CT OF THE RIGHT HIP WITHOUT CONTRAST TECHNIQUE: Multidetector CT imaging of the right hip was performed  according to the standard protocol. Multiplanar CT image reconstructions were also generated. RADIATION DOSE REDUCTION: This exam was performed according to the departmental dose-optimization program which includes automated exposure control, adjustment of the mA and/or kV according to patient size and/or use of iterative reconstruction technique. COMPARISON:  None. FINDINGS: Bones/Joint/Cartilage No acute fracture or dislocation. Post-traumatic varus angulation at the right femoral neck with evidence of prior ORIF and subsequent hardware removal. Retained screw fragment within the proximal femoral diaphysis. Severe, end-stage osteoarthritis of the right hip joint. Complete joint space loss with subchondral sclerosis, prominent subchondral cystic changes, and bulky marginal osteophytes. Remodeling of the superior acetabulum. No large hip joint effusion is evident. Visualized right sacroiliac joint and pubic symphysis are intact. Moderate arthropathy of the pubic symphysis with chondrocalcinosis. No suspicious lytic or sclerotic bone lesion. Ligaments Suboptimally assessed by CT. Muscles and Tendons No acute musculotendinous abnormality by CT. Soft tissues No soft tissue edema or fluid collection. No right inguinal lymphadenopathy. IMPRESSION: 1. Severe, end-stage osteoarthritis of the right hip joint. 2. Post-traumatic deformity of the right femoral neck with evidence of prior ORIF and subsequent hardware removal. Retained screw fragment within the proximal femoral diaphysis. Electronically Signed   By: Davina Poke D.O.   On: 12/01/2021 09:37   PERIPHERAL VASCULAR CATHETERIZATION  Result Date: 11/25/2021 Images from the original result were not included. Patient name: Brittany [name] MRN: [id number] DOB: 07-Nov-1945 Sex: female 11/25/2021 Pre-operative Diagnosis: History of PE, need for right total hip arthroplasty Post-operative diagnosis:  Same Surgeon:  Eda Paschal. Donzetta Matters, MD Procedure Performed: 1.   Ultrasound-guided cannulation right common femoral vein 2.  Placement of Cook Celect IVC filter in infrarenal position 3.  Moderate sedation with fentanyl and Versed for 22 minutes 4.  IVC venogram Indications: 76 year old female with history of PE on 2 separate occasions.  She is now on chronic Coumadin.  She is scheduled for right total hip arthroplasty.  She is indicated for filter placement. Findings: IVC was patent and measured less than 28 mm filter was placed in the infrarenal position. Plan will be to remove filter in 3 months  Procedure:  The patient was identified in the holding area and taken to room 8.  The patient was then placed supine on the table and prepped and draped in the usual sterile fashion.  A time out was called.  Ultrasound was used to evaluate the right common femoral vein was patent and compressible.  There is anesthetized 1% lidocaine.  Moderate sedation was administered with fentanyl and Versed and her vital signs were monitored throughout the case.  The common femoral vein was cannulated with micropuncture needle followed by wire and sheath.  Bentson wire was then placed and the introducer sheath was placed and venogram was performed.  Filter was then placed in the infrarenal position.  Sheath was removed she tolerated procedure well without immediate complication. Contrast: 35 minutes Brandon C. Donzetta Matters, MD [facility] Office: [phone] Pager: [phone]   DG C-Arm 1-60 Min-No Report  Result Date: 12/05/2021 Fluoroscopy was utilized by the requesting physician.  No radiographic interpretation.   DG C-Arm 1-60 Min-No Report  Result Date: 12/05/2021 Fluoroscopy was utilized by the requesting physician.  No radiographic interpretation.   MM 3D SCREEN BREAST BILATERAL  Result Date: 12/05/2021 CLINICAL DATA:  Screening. EXAM: DIGITAL SCREENING BILATERAL MAMMOGRAM WITH TOMOSYNTHESIS AND CAD TECHNIQUE:  Bilateral screening digital craniocaudal and  mediolateral oblique mammograms were obtained. Bilateral screening digital breast tomosynthesis was performed. The images were evaluated with computer-aided detection. COMPARISON:  Previous exam(s). ACR Breast Density Category b: There are scattered areas of fibroglandular density. FINDINGS: There are no findings suspicious for malignancy. IMPRESSION: No mammographic evidence of malignancy. A result letter of this screening mammogram will be mailed directly to the patient. RECOMMENDATION: Screening mammogram in one year. (Code:[id number]-B-01Y) BI-RADS CATEGORY  1: Negative. Electronically Signed   By: Audie Pinto M.D.   On: 12/05/2021 08:36   DG HIP UNILAT WITH PELVIS 1V RIGHT  Result Date: 12/05/2021 CLINICAL DATA:  Fluoroscopic assistance for right hip arthroplasty EXAM: DG HIP (WITH OR WITHOUT PELVIS) 1V RIGHT COMPARISON:  CT done on 11/29/2021 FINDINGS: Fluoroscopic images show right hip arthroplasty. No fracture or dislocation is seen. IMPRESSION: Fluoroscopic assistance was provided for right hip arthroplasty. Electronically Signed   By: Elmer Picker M.D.   On: 12/05/2021 12:05    Disposition:  Discharge disposition: 01-Home or Self Care       Discharge Instructions     Call MD / Call 911   Complete by: As directed    If you experience chest pain or shortness of breath, CALL 911 and be transported to the hospital emergency room.  If you develope a fever above 101 F, pus (white drainage) or increased drainage or redness at the wound, or calf pain, call your surgeon's office.   Constipation Prevention   Complete by: As directed    Drink plenty of fluids.  Prune juice may be helpful.  You may use a stool softener, such as Colace (over the counter) 100 mg twice a day.  Use MiraLax (over the counter) for constipation as needed.   Diet - low sodium heart healthy   Complete by: As directed    Driving restrictions   Complete by: As directed    No driving for 6 weeks   Increase activity  slowly as tolerated   Complete by: As directed    Lifting restrictions   Complete by: As directed    No lifting for 6 weeks   Post-operative opioid taper instructions:   Complete by: As directed    POST-OPERATIVE OPIOID TAPER INSTRUCTIONS: It is important to wean off of your opioid medication as soon as possible. If you do not need pain medication after your surgery it is ok to stop day one. Opioids include: Codeine, Hydrocodone(Norco, Vicodin), Oxycodone(Percocet, oxycontin) and hydromorphone amongst others.  Long term and even short term use of opiods can cause: Increased pain response Dependence Constipation Depression Respiratory depression And more.  Withdrawal symptoms can include Flu like symptoms Nausea, vomiting And more Techniques to manage these symptoms Hydrate well Eat regular healthy meals Stay active Use relaxation techniques(deep breathing, meditating, yoga) Do Not substitute Alcohol to help with tapering If you have been on opioids for less than two weeks and do not have pain than it is ok to stop all together.  Plan to wean off of opioids This plan should start within one week post op of your joint replacement. Maintain the same interval or time between taking each dose and first decrease the dose.  Cut the total daily intake of opioids by one tablet each day Next start to increase the time between doses. The last dose that should be eliminated is the evening dose.      TED hose   Complete by: As directed    Use stockings (TED  hose) for 2 weeks on both leg(s).  You may remove them at night for sleeping.        Follow-up Information     Rod Can, MD. Go on 12/20/2021.   Specialty: Orthopedic Surgery Why: You are scheduled for first post op appointment on Friday February 24th at 3:30pm. Contact information: [address] [place] [address] [phone]                  Signed: Hilton Cork Roena Sassaman 12/06/2021, 11:30  AM

## 2021-12-09 DIAGNOSIS — Z7901 Long term (current) use of anticoagulants: Secondary | ICD-10-CM | POA: Diagnosis not present

## 2021-12-11 ENCOUNTER — Encounter (HOSPITAL_COMMUNITY): Payer: Self-pay | Admitting: Orthopedic Surgery

## 2021-12-20 DIAGNOSIS — Z471 Aftercare following joint replacement surgery: Secondary | ICD-10-CM | POA: Diagnosis not present

## 2021-12-20 DIAGNOSIS — Z96641 Presence of right artificial hip joint: Secondary | ICD-10-CM | POA: Diagnosis not present

## 2021-12-20 DIAGNOSIS — Z4789 Encounter for other orthopedic aftercare: Secondary | ICD-10-CM | POA: Diagnosis not present

## 2021-12-23 DIAGNOSIS — Z7901 Long term (current) use of anticoagulants: Secondary | ICD-10-CM | POA: Diagnosis not present

## 2021-12-23 DIAGNOSIS — I2782 Chronic pulmonary embolism: Secondary | ICD-10-CM | POA: Diagnosis not present

## 2021-12-30 DIAGNOSIS — I2782 Chronic pulmonary embolism: Secondary | ICD-10-CM | POA: Diagnosis not present

## 2021-12-30 DIAGNOSIS — Z7901 Long term (current) use of anticoagulants: Secondary | ICD-10-CM | POA: Diagnosis not present

## 2022-01-15 DIAGNOSIS — Z7901 Long term (current) use of anticoagulants: Secondary | ICD-10-CM | POA: Diagnosis not present

## 2022-01-15 DIAGNOSIS — I2782 Chronic pulmonary embolism: Secondary | ICD-10-CM | POA: Diagnosis not present

## 2022-01-17 DIAGNOSIS — Z4789 Encounter for other orthopedic aftercare: Secondary | ICD-10-CM | POA: Diagnosis not present

## 2022-01-17 DIAGNOSIS — Z471 Aftercare following joint replacement surgery: Secondary | ICD-10-CM | POA: Diagnosis not present

## 2022-01-17 DIAGNOSIS — Z96641 Presence of right artificial hip joint: Secondary | ICD-10-CM | POA: Diagnosis not present

## 2022-02-11 NOTE — Progress Notes (Incomplete)
? ?Patient Care Team: ?[name], MD as PCP - General (Family Medicine) ?Werner Lean, MD as PCP - Cardiology (Cardiology) ?[name], MD as Consulting Physician (Hematology and Oncology) ?[name], MD as Consulting Physician (General Surgery) ?[name], MD as Consulting Physician (Radiation Oncology) ?[name], NP as Nurse Practitioner (Hematology and Oncology) ? ?DIAGNOSIS: No diagnosis found. ? ?SUMMARY OF ONCOLOGIC HISTORY: ?Oncology History Overview Note  ?8/ ? ?  ?Malignant neoplasm of upper-outer quadrant of right breast in female, estrogen receptor positive ([place])  ?06/19/2017 Initial Diagnosis  ? Intermittent bloody nipple discharge in the right breast, mammogram showed asymmetry right breast upper outer quadrant ultrasound negative biopsy 05/26/2017 fibrocystic changes ; breast MRI revealed 5.6 x 3.4 cm non-mass enhancement, MRI guided biopsy revealed: IDC with DCIS, grade 2, ER 100%, PR 15%, Ki-67 5%, T3 N0 stage IIA AJCC 8 clinical stage ? ?  ?07/03/2017 Oncotype testing  ? Oncotype DX score 18: Risk of recurrence 11% ? ?  ?07/03/2017 -  Anti-estrogen oral therapy  ? Letrozole 2.5 mg daily neoadjuvant hormone therapy ? ?  ?06/08/2018 Surgery  ? Right lumpectomy: IDC grade 1, 0.3 cm, intermediate grade DCIS, margins negative, 0/5 lymph nodes negative, T1 a N0 stage Ia ? ?  ?06/12/2018 Surgery  ? Evacuation of right breast postoperative hematoma most likely due to left brachial vein DVT on anticoagulation ? ?  ?06/23/2018 Cancer Staging  ? Staging form: Breast, AJCC 8th Edition ?- Pathologic: Stage IA (pT1a, pN0, cM0, G1, ER+, PR+, HER2-, Oncotype DX score: 18) - Signed by [name], NP on 10/12/2018 ? ?  ? ? ?CHIEF COMPLIANT: Follow-up of right breast cancer on letrozole ? ?INTERVAL HISTORY: Brittany [name] is a 76 y.o. with above-mentioned history of right breast cancer treated with 1 year of neoadjuvant antiestrogen therapy followed by lumpectomy  and adjuvant antiestrogen therapy with letrozole. ? ? ?ALLERGIES:  has No Known Allergies.  ? ?MEDICATIONS:  ?Current Outpatient Medications  ?Medication Sig Dispense Refill  ? Ascorbic Acid (VITAMIN C) 1000 MG tablet Take 1,000 mg by mouth daily.    ? CALCIUM PO Take 600 mg by mouth daily.    ? cholecalciferol (VITAMIN D) 1000 units tablet Take 1,000 Units by mouth daily.     ? enoxaparin (LOVENOX) 40 MG/0.4ML injection Inject 0.8 mLs (80 mg total) into the skin every 12 (twelve) hours for 3 days. 4.8 mL 0  ? ferrous sulfate 325 (65 FE) MG tablet Take 1 tablet (325 mg total) by mouth 2 (two) times daily with a meal. (Patient taking differently: Take 325 mg by mouth daily.) 60 tablet 3  ? HYDROcodone-acetaminophen (NORCO/VICODIN) 5-325 MG tablet Take 1 tablet by mouth every 4 (four) hours as needed for moderate pain (pain score 4-6). 40 tablet 0  ? MAGNESIUM PO Take 500 mg by mouth daily.    ? methocarbamol (ROBAXIN) 500 MG tablet Take 1 tablet (500 mg total) by mouth every 6 (six) hours as needed for muscle spasms. 20 tablet 0  ? ondansetron (ZOFRAN) 4 MG tablet Take 1 tablet (4 mg total) by mouth every 6 (six) hours as needed for nausea. 20 tablet 0  ? polyethylene glycol (MIRALAX / GLYCOLAX) 17 g packet Take 17 g by mouth daily as needed for mild constipation. 14 each 0  ? senna (SENOKOT) 8.6 MG TABS tablet Take 1 tablet (8.6 mg total) by mouth 2 (two) times daily. 120 tablet 0  ? warfarin (COUMADIN) 5 MG tablet Take 1 tablet (  5 mg total) by mouth daily. Except Tue and Thu (Patient taking differently: Take 5 mg by mouth See admin instructions. On mon, wed, fri, sun)    ? warfarin (COUMADIN) 7.5 MG tablet Take 1 tablet (7.5 mg total) by mouth daily. Tue and Thu (Patient taking differently: Take 7.5 mg by mouth See admin instructions. On Tues, thur sat)    ? ?No current facility-administered medications for this visit.  ? ? ?PHYSICAL EXAMINATION: ?ECOG PERFORMANCE STATUS: {CHL ONC ECOG XI:[id number]} ? ?There were  no vitals filed for this visit. ?There were no vitals filed for this visit. ? ?BREAST:*** No palpable masses or nodules in either right or left breasts. No palpable axillary supraclavicular or infraclavicular adenopathy no breast tenderness or nipple discharge. (exam performed in the presence of a chaperone) ? ?LABORATORY DATA:  ?I have reviewed the data as listed ? ?  Latest Ref Rng & Units 12/06/2021  ?  3:17 AM 12/05/2021  ?  2:42 PM 11/25/2021  ?  6:19 AM  ?CMP  ?Glucose 70 - 99 mg/dL 135    89    ?BUN 8 - 23 mg/dL 16    14    ?Creatinine 0.44 - 1.00 mg/dL 0.60   0.77   0.60    ?Sodium 135 - 145 mmol/L 139    142    ?Potassium 3.5 - 5.1 mmol/L 3.9    3.8    ?Chloride 98 - 111 mmol/L 112    110    ?CO2 22 - 32 mmol/L 23      ?Calcium 8.9 - 10.3 mg/dL 8.3      ? ? ?Lab Results  ?Component Value Date  ? WBC 10.6 (H) 12/06/2021  ? HGB 9.7 (L) 12/06/2021  ? HCT 31.5 (L) 12/06/2021  ? MCV 83.8 12/06/2021  ? PLT 194 12/06/2021  ? NEUTROABS 4.1 12/30/2018  ? ? ?ASSESSMENT & PLAN:  ?No problem-specific Assessment & Plan notes found for this encounter. ? ? ? ?No orders of the defined types were placed in this encounter. ? ?The patient has a good understanding of the overall plan. she agrees with it. she will call with any problems that may develop before the next visit here. ?Total time spent: 30 mins including face to face time and time spent for planning, charting and co-ordination of care ? ? Suzzette Righter, CMA ?02/11/22 ? ? ? I Gardiner Coins am scribing for Dr. Lindi Adie ? ?***  ?

## 2022-02-13 DIAGNOSIS — Z7901 Long term (current) use of anticoagulants: Secondary | ICD-10-CM | POA: Diagnosis not present

## 2022-02-13 DIAGNOSIS — I2782 Chronic pulmonary embolism: Secondary | ICD-10-CM | POA: Diagnosis not present

## 2022-02-25 ENCOUNTER — Ambulatory Visit: Payer: Medicare Other | Admitting: Hematology and Oncology

## 2022-02-25 DIAGNOSIS — M278 Other specified diseases of jaws: Secondary | ICD-10-CM | POA: Diagnosis not present

## 2022-02-27 DIAGNOSIS — Z7901 Long term (current) use of anticoagulants: Secondary | ICD-10-CM | POA: Diagnosis not present

## 2022-02-27 DIAGNOSIS — I2782 Chronic pulmonary embolism: Secondary | ICD-10-CM | POA: Diagnosis not present

## 2022-03-13 ENCOUNTER — Other Ambulatory Visit: Payer: Self-pay

## 2022-03-13 DIAGNOSIS — I2782 Chronic pulmonary embolism: Secondary | ICD-10-CM | POA: Diagnosis not present

## 2022-03-13 DIAGNOSIS — Z7901 Long term (current) use of anticoagulants: Secondary | ICD-10-CM | POA: Diagnosis not present

## 2022-03-13 DIAGNOSIS — Z95828 Presence of other vascular implants and grafts: Secondary | ICD-10-CM

## 2022-03-17 ENCOUNTER — Encounter (HOSPITAL_COMMUNITY)
Admission: RE | Disposition: A | Discharge status: Home / Self Care | Payer: Self-pay | Source: Home / Self Care | Attending: Vascular Surgery

## 2022-03-17 ENCOUNTER — Other Ambulatory Visit: Payer: Self-pay

## 2022-03-17 ENCOUNTER — Ambulatory Visit (HOSPITAL_COMMUNITY)
Admission: RE | Admit: 2022-03-17 | Discharge: 2022-03-17 | Disposition: A | Discharge status: Home / Self Care | Payer: Medicare Other | Attending: Vascular Surgery | Admitting: Vascular Surgery

## 2022-03-17 DIAGNOSIS — Z86711 Personal history of pulmonary embolism: Secondary | ICD-10-CM | POA: Insufficient documentation

## 2022-03-17 DIAGNOSIS — M25551 Pain in right hip: Secondary | ICD-10-CM | POA: Diagnosis not present

## 2022-03-17 DIAGNOSIS — Z95828 Presence of other vascular implants and grafts: Secondary | ICD-10-CM

## 2022-03-17 DIAGNOSIS — Z4589 Encounter for adjustment and management of other implanted devices: Secondary | ICD-10-CM | POA: Insufficient documentation

## 2022-03-17 HISTORY — PX: IVC FILTER REMOVAL: CATH118246

## 2022-03-17 LAB — POCT I-STAT, CHEM 8
BUN: 12 mg/dL (ref 8–23)
Calcium, Ion: 1.14 mmol/L — ABNORMAL LOW (ref 1.15–1.40)
Chloride: 107 mmol/L (ref 98–111)
Creatinine, Ser: 0.6 mg/dL (ref 0.44–1.00)
Glucose, Bld: 90 mg/dL (ref 70–99)
HCT: 41 % (ref 36.0–46.0)
Hemoglobin: 13.9 g/dL (ref 12.0–15.0)
Potassium: 4 mmol/L (ref 3.5–5.1)
Sodium: 141 mmol/L (ref 135–145)
TCO2: 25 mmol/L (ref 22–32)

## 2022-03-17 LAB — PROTIME-INR
INR: 1.3 — ABNORMAL HIGH (ref 0.8–1.2)
Prothrombin Time: 16.5 seconds — ABNORMAL HIGH (ref 11.4–15.2)

## 2022-03-17 SURGERY — IVC FILTER REMOVAL
Anesthesia: LOCAL

## 2022-03-17 MED ORDER — HEPARIN (PORCINE) IN NACL 1000-0.9 UT/500ML-% IV SOLN
INTRAVENOUS | Status: DC | PRN
Start: 2022-03-17 — End: 2022-03-18
  Administered 2022-03-17: 500 mL

## 2022-03-17 MED ORDER — LIDOCAINE HCL (PF) 1 % IJ SOLN
INTRAMUSCULAR | Status: DC | PRN
  Administered 2022-03-17: 2 mL

## 2022-03-17 MED ORDER — IODIXANOL 320 MG/ML IV SOLN
INTRAVENOUS | Status: DC | PRN
  Administered 2022-03-17: 20 mL

## 2022-03-17 MED ORDER — MIDAZOLAM HCL 2 MG/2ML IJ SOLN
INTRAMUSCULAR | Status: DC | PRN
  Administered 2022-03-17 (×2): 1 mg via INTRAVENOUS

## 2022-03-17 MED ORDER — HEPARIN (PORCINE) IN NACL 1000-0.9 UT/500ML-% IV SOLN
INTRAVENOUS | Status: AC
  Filled 2022-03-17: qty 1000

## 2022-03-17 MED ORDER — FENTANYL CITRATE (PF) 100 MCG/2ML IJ SOLN
INTRAMUSCULAR | Status: AC
Start: 2022-03-17 — End: ?
  Filled 2022-03-17: qty 2

## 2022-03-17 MED ORDER — FENTANYL CITRATE (PF) 100 MCG/2ML IJ SOLN
INTRAMUSCULAR | Status: DC | PRN
  Administered 2022-03-17 (×2): 25 ug via INTRAVENOUS

## 2022-03-17 MED ORDER — LIDOCAINE HCL (PF) 1 % IJ SOLN
INTRAMUSCULAR | Status: AC
  Filled 2022-03-17: qty 30

## 2022-03-17 MED ORDER — MIDAZOLAM HCL 2 MG/2ML IJ SOLN
INTRAMUSCULAR | Status: AC
Start: 2022-03-17 — End: ?
  Filled 2022-03-17: qty 2

## 2022-03-17 MED ORDER — SODIUM CHLORIDE 0.9 % IV SOLN: INTRAVENOUS | Status: DC

## 2022-03-17 SURGICAL SUPPLY — 16 items
BALLN MUSTANG 8.0X40 135 (BALLOONS) ×2
BALLOON MUSTANG 8.0X40 135 (BALLOONS) IMPLANT
CATH SOS OMNI O 5F 80CM (CATHETERS) ×1 IMPLANT
CATH VISTA GUIDE 7FR H STICK (CATHETERS) ×1 IMPLANT
CATH VISTA GUIDE 7FR MPA1 (CATHETERS) ×1 IMPLANT
DEVICE ONE SNARE 25MM (VASCULAR PRODUCTS) ×1 IMPLANT
DRYSEAL FLEXSHEATH 16FR 33CM (SHEATH) ×1
GUIDEWIRE ANGLED .035X260CM (WIRE) ×2 IMPLANT
KIT ENCORE 26 ADVANTAGE (KITS) ×1 IMPLANT
KIT MICROPUNCTURE NIT STIFF (SHEATH) ×1 IMPLANT
KIT PV (KITS) ×3 IMPLANT
SET CLOVERSNARE FLT RETRIEVAL (MISCELLANEOUS) ×1 IMPLANT
SHEATH DRYSEAL FLEX 16FR 33CM (SHEATH) IMPLANT
TRAY PV CATH (CUSTOM PROCEDURE TRAY) ×3 IMPLANT
WIRE BENTSON .035X145CM (WIRE) ×2 IMPLANT
WIRE ROSEN-J .035X260CM (WIRE) ×1 IMPLANT

## 2022-03-17 NOTE — Op Note (Signed)
    Patient name: Brittany [name] MRN: [id number] DOB: 1946/10/24 Sex: female  03/17/2022 Pre-operative Diagnosis: indwelling IVC filter Post-operative diagnosis:  Same Surgeon:  Erlene Quan C. Donzetta Matters, MD Procedure Performed: 1.  Ultrasound-guided cannulation right internal jugular vein 2.  Central venogram 3.  IVC filter retrieval 4.  Moderate sedation with fentanyl and Versed for 70 minutes   Indications: 76 year old female with history of IVC filter placement for hip replacement.  She is now indicated for filter removal.  Findings: The hook had grown into the left sidewall of the IVC this was confirmed with venography.  We ultimately placed a 16 French sheath were able to snare a Glidewire around the midportion of the filter and retrieve the filter totally intact.   Procedure:  The patient was identified in the holding area and taken to room 8.  The patient was then placed supine on the table and prepped and draped in the usual sterile fashion.  A time out was called.  Ultrasound was used to evaluate the right IJ.  There was some chronic appearing thrombus there.  It was ultimately cannulated with an 18-gauge needle and a Bentson wire was placed centrally under fluoroscopic guidance.  I placed the [facility] introducer sheath down into the abdomen.  I initially attempted to snare but noted that the hook appeared to be in the wall.  We performed venogram which confirmed this.  We used multiple separate catheters to attempt to snare but ultimately I placed a Rosen wire placed 33 cm 16 French sheath.  I then placed a Glidewire down and around the filter in the midportion of it using an Omni catheter.  I was able to snare this wire.  After had both ends of the wire I pulled tension on the filter and then advance the sheath over it and it was removed intact.  Completion venogram demonstrated no perforation.  The sheath was then removed and I suture-ligated the cannulation site with 4-0 Monocryl suture.  Patient  tolerated the procedure well without immediate complication.  Contrast: 20cc  Shae Hinnenkamp C. Donzetta Matters, MD [facility] [place] Office: [phone] Pager: [phone]

## 2022-03-17 NOTE — H&P (Signed)
H&P  History of Present Illness:  76 y.o. female history of IVC filter placement for history of PE with plan for hip replacement.  She has now healed well from the hip replacement and is indicated for filter removal.  Past Medical History:  Diagnosis Date   Acute deep vein thrombosis (DVT) of brachial vein of left upper extremity ([place]) 06/14/2018   Cancer (HCC)    Chronic back pain    History of ectopic pregnancy    History of pulmonary embolism    anticoagulated on coumadin   Personal history of chemotherapy     Past Surgical History:  Procedure Laterality Date   BREAST BIOPSY Right 05/25/2017   malignant   BREAST BIOPSY Right 07/14/2017   BREAST BIOPSY Right 06/22/2017   malignant   BREAST LUMPECTOMY Right 05/2018   BREAST LUMPECTOMY WITH RADIOACTIVE SEED AND SENTINEL LYMPH NODE BIOPSY Right 06/08/2018   Procedure: RIGHT BREAST LUMPECTOMY WITH DOUBLE BRACKETED RADIOACTIVE SEEDS AND SENTINEL LYMPH NODE BIOPSY ERAS PATHWAY;  Surgeon: [name], MD;  Location: [place];  Service: General;  Laterality: Right;  TAP BLOCK   HIP FRACTURE SURGERY     IVC FILTER INSERTION N/A 11/25/2021   Procedure: IVC FILTER INSERTION;  Surgeon: Waynetta Sandy, MD;  Location: [place] [facility];  Service: Cardiovascular;  Laterality: N/A;   MINOR BREAST BIOPSY Right 06/13/2018   Procedure: EVACUATION OF RIGHT BREAST HEMATOMA;  Surgeon: [name], MD;  Location: [place];  Service: General;  Laterality: Right;   TOTAL HIP ARTHROPLASTY Right 12/05/2021   Procedure: TOTAL HIP ARTHROPLASTY ANTERIOR APPROACH;  Surgeon: Rod Can, MD;  Location: [facility];  Service: Orthopedics;  Laterality: Right;  150   TUBAL LIGATION Bilateral     No Active Allergies  Prior to Admission medications   Medication Sig Start Date End Date Taking? Authorizing Provider  Ascorbic Acid (VITAMIN C) 1000 MG tablet Take 1,000 mg by mouth daily.   Yes [provider]  CALCIUM PO  Take 500 mg by mouth daily.   Yes [provider]  cholecalciferol (VITAMIN D) 1000 units tablet Take 1,000 Units by mouth daily.    Yes [provider]  docusate sodium (COLACE) 100 MG capsule Take 100 mg by mouth 2 (two) times daily.   Yes [provider]  ferrous sulfate 325 (65 FE) MG tablet Take 1 tablet (325 mg total) by mouth 2 (two) times daily with a meal. Patient taking differently: Take 325 mg by mouth daily. 06/15/18  Yes [name], MD  MAGNESIUM PO Take 500 mg by mouth daily.   Yes [provider]  warfarin (COUMADIN) 5 MG tablet Take 1 tablet (5 mg total) by mouth daily. Except Tue and Thu Patient taking differently: Take 5 mg by mouth See admin instructions. Take on mon, wed, fri, sun., sat 06/25/17  Yes [name], MD  warfarin (COUMADIN) 7.5 MG tablet Take 1 tablet (7.5 mg total) by mouth daily. Tue and Thu Patient taking differently: Take 7.5 mg by mouth See admin instructions. On Tues, thur 06/25/17  Yes [name], MD  HYDROcodone-acetaminophen (NORCO/VICODIN) 5-325 MG tablet Take 1 tablet by mouth every 4 (four) hours as needed for moderate pain (pain score 4-6). 12/06/21   Swinteck, Aaron Edelman, MD  methocarbamol (ROBAXIN) 500 MG tablet Take 1 tablet (500 mg total) by mouth every 6 (six) hours as needed for muscle spasms. Patient not taking: Reported on 03/13/2022 12/06/21   Rod Can, MD  ondansetron [facility]) 4  MG tablet Take 1 tablet (4 mg total) by mouth every 6 (six) hours as needed for nausea. Patient not taking: Reported on 03/13/2022 12/06/21   Rod Can, MD  polyethylene glycol (MIRALAX / GLYCOLAX) 17 g packet Take 17 g by mouth daily as needed for mild constipation. Patient not taking: Reported on 03/13/2022 12/06/21   Rod Can, MD  senna (SENOKOT) 8.6 MG TABS tablet Take 1 tablet (8.6 mg total) by mouth 2 (two) times daily. Patient not taking: Reported on 03/13/2022 12/06/21   Rod Can, MD    Social History    Socioeconomic History   Marital status: Married    Spouse name: Not on file   Number of children: 2   Years of education: Not on file   Highest education level: Not on file  Occupational History   Not on file  Tobacco Use   Smoking status: Never   Smokeless tobacco: Never  Vaping Use   Vaping Use: Never used  Substance and Sexual Activity   Alcohol use: No   Drug use: No   Sexual activity: Not on file  Other Topics Concern   Not on file  Social History Narrative   Not on file   Social Determinants of Health   Financial Resource Strain: Not on file  Food Insecurity: Not on file  Transportation Needs: Not on file  Physical Activity: Not on file  Stress: Not on file  Social Connections: Not on file  Intimate Partner Violence: Not on file     Family History  Problem Relation Age of Onset   Prostate cancer Father    Prostate cancer Brother     ROS: no complaints today   Physical Examination  Vitals:   03/17/22 1159  BP: 129/73  Pulse: 100  Resp: 19  Temp: 97.8 F (36.6 C)  SpO2: 98%   Body mass index is 33.28 kg/m.  Aaox3 Non labored respirations Abdomen is soft  CBC    Component Value Date/Time   WBC 10.6 (H) 12/06/2021 0317   RBC 3.76 (L) 12/06/2021 0317   HGB 13.9 03/17/2022 1400   HGB 11.4 (L) 12/30/2018 1126   HCT 41.0 03/17/2022 1400   PLT 194 12/06/2021 0317   PLT 288 12/30/2018 1126   MCV 83.8 12/06/2021 0317   MCH 25.8 (L) 12/06/2021 0317   MCHC 30.8 12/06/2021 0317   RDW 14.6 12/06/2021 0317   LYMPHSABS 2.9 12/30/2018 1126   MONOABS 0.6 12/30/2018 1126   EOSABS 0.3 12/30/2018 1126   BASOSABS 0.0 12/30/2018 1126    BMET    Component Value Date/Time   NA 141 03/17/2022 1400   K 4.0 03/17/2022 1400   CL 107 03/17/2022 1400   CO2 23 12/06/2021 0317   GLUCOSE 90 03/17/2022 1400   BUN 12 03/17/2022 1400   CREATININE 0.60 03/17/2022 1400   CREATININE 0.83 12/28/2017 0854   CALCIUM 8.3 (L) 12/06/2021 0317   GFRNONAA >60  12/06/2021 0317   GFRNONAA >60 12/28/2017 0854   GFRAA >60 06/04/2019 1251   GFRAA >60 12/28/2017 0854    COAGS: Lab Results  Component Value Date   INR 1.3 (H) 03/17/2022   INR 1.2 12/06/2021   INR 1.1 12/05/2021     Non-Invasive Vascular Imaging:   No studies  ASSESSMENT/PLAN: This is a 76 y.o. female history of an IVC filter.  Plan will be for removal today in the Cath Lab.  We discussed the risk benefits alternatives as well as the plan for neck  access and she demonstrates good understanding.  Kaine Mcquillen C. Donzetta Matters, MD [facility] [place] Office: [phone] Pager: [phone]

## 2022-03-18 ENCOUNTER — Encounter (HOSPITAL_COMMUNITY): Payer: Self-pay | Admitting: Vascular Surgery

## 2022-03-18 NOTE — Progress Notes (Signed)
Patient Care Team: [name], MD as PCP - General (Family Medicine) Werner Lean, MD as PCP - Cardiology (Cardiology) [name], MD as Consulting Physician (Hematology and Oncology) [name], MD as Consulting Physician (General Surgery) [name], MD as Consulting Physician (Radiation Oncology) Delice Bison Charlestine Massed, NP as Nurse Practitioner (Hematology and Oncology)  DIAGNOSIS:  Encounter Diagnosis  Name Primary?   Malignant neoplasm of upper-outer quadrant of right breast in female, estrogen receptor positive ([place])     SUMMARY OF ONCOLOGIC HISTORY: Oncology History Overview Note  8/    Malignant neoplasm of upper-outer quadrant of right breast in female, estrogen receptor positive ([place])  06/19/2017 Initial Diagnosis   Intermittent bloody nipple discharge in the right breast, mammogram showed asymmetry right breast upper outer quadrant ultrasound negative biopsy 05/26/2017 fibrocystic changes ; breast MRI revealed 5.6 x 3.4 cm non-mass enhancement, MRI guided biopsy revealed: IDC with DCIS, grade 2, ER 100%, PR 15%, Ki-67 5%, T3 N0 stage IIA AJCC 8 clinical stage    07/03/2017 Oncotype testing   Oncotype DX score 18: Risk of recurrence 11%    07/03/2017 -  Anti-estrogen oral therapy   Letrozole 2.5 mg daily neoadjuvant hormone therapy    06/08/2018 Surgery   Right lumpectomy: IDC grade 1, 0.3 cm, intermediate grade DCIS, margins negative, 0/5 lymph nodes negative, T1 a N0 stage Ia    06/12/2018 Surgery   Evacuation of right breast postoperative hematoma most likely due to left brachial vein DVT on anticoagulation    06/23/2018 Cancer Staging   Staging form: Breast, AJCC 8th Edition - Pathologic: Stage IA (pT1a, pN0, cM0, G1, ER+, PR+, HER2-, Oncotype DX score: 18) - Signed by [name], NP on 10/12/2018      CHIEF COMPLIANT: right breast cancer surveillance    INTERVAL HISTORY: Brittany [name] is a 76 y.o. female with  above-mentioned history of right breast cancer treated with 1 year of neoadjuvant antiestrogen therapy followed by lumpectomy and adjuvant antiestrogen therapy with letrozole. She presents to the clinic today for a follow-up. States that she stopped taking the letrozole due to joint pain. States that she couldn't harley open her hand. States that she has neck and hip pain.   ALLERGIES:  has no active allergies.  MEDICATIONS:  Current Outpatient Medications  Medication Sig Dispense Refill   Ascorbic Acid (VITAMIN C) 1000 MG tablet Take 1,000 mg by mouth daily.     CALCIUM PO Take 500 mg by mouth daily.     cholecalciferol (VITAMIN D) 1000 units tablet Take 1,000 Units by mouth daily.      docusate sodium (COLACE) 100 MG capsule Take 100 mg by mouth 2 (two) times daily.     ferrous sulfate 325 (65 FE) MG tablet Take 1 tablet (325 mg total) by mouth 2 (two) times daily with a meal. (Patient taking differently: Take 325 mg by mouth daily.) 60 tablet 3   HYDROcodone-acetaminophen (NORCO/VICODIN) 5-325 MG tablet Take 1 tablet by mouth every 4 (four) hours as needed for moderate pain (pain score 4-6). 40 tablet 0   MAGNESIUM PO Take 500 mg by mouth daily.     methocarbamol (ROBAXIN) 500 MG tablet Take 1 tablet (500 mg total) by mouth every 6 (six) hours as needed for muscle spasms. (Patient not taking: Reported on 03/13/2022) 20 tablet 0   ondansetron (ZOFRAN) 4 MG tablet Take 1 tablet (4 mg total) by mouth every 6 (six) hours as needed for nausea. (Patient not taking: Reported  on 03/13/2022) 20 tablet 0   polyethylene glycol (MIRALAX / GLYCOLAX) 17 g packet Take 17 g by mouth daily as needed for mild constipation. (Patient not taking: Reported on 03/13/2022) 14 each 0   senna (SENOKOT) 8.6 MG TABS tablet Take 1 tablet (8.6 mg total) by mouth 2 (two) times daily. (Patient not taking: Reported on 03/13/2022) 120 tablet 0   warfarin (COUMADIN) 5 MG tablet Take 1 tablet (5 mg total) by mouth daily. Except Tue  and Thu (Patient taking differently: Take 5 mg by mouth See admin instructions. Take on mon, wed, fri, sun., sat)     warfarin (COUMADIN) 7.5 MG tablet Take 1 tablet (7.5 mg total) by mouth daily. Tue and Thu (Patient taking differently: Take 7.5 mg by mouth See admin instructions. On Tues, thur)     No current facility-administered medications for this visit.    PHYSICAL EXAMINATION: ECOG PERFORMANCE STATUS: 1 - Symptomatic but completely ambulatory  Vitals:   03/25/22 1110  BP: (!) 142/66  Pulse: 92  Resp: 18  Temp: (!) 97.2 F (36.2 C)  SpO2: 100%   Filed Weights   03/25/22 1110  Weight: 194 lb 1.6 oz (88 kg)    BREAST: No palpable masses or nodules in either right or left breasts. No palpable axillary supraclavicular or infraclavicular adenopathy no breast tenderness or nipple discharge. (exam performed in the presence of a chaperone)  LABORATORY DATA:  I have reviewed the data as listed    Latest Ref Rng & Units 03/17/2022    2:00 PM 12/06/2021    3:17 AM 12/05/2021    2:42 PM  CMP  Glucose 70 - 99 mg/dL 90   135     BUN 8 - 23 mg/dL 12   16     Creatinine 0.44 - 1.00 mg/dL 0.60   0.60   0.77    Sodium 135 - 145 mmol/L 141   139     Potassium 3.5 - 5.1 mmol/L 4.0   3.9     Chloride 98 - 111 mmol/L 107   112     CO2 22 - 32 mmol/L  23     Calcium 8.9 - 10.3 mg/dL  8.3       Lab Results  Component Value Date   WBC 10.6 (H) 12/06/2021   HGB 13.9 03/17/2022   HCT 41.0 03/17/2022   MCV 83.8 12/06/2021   PLT 194 12/06/2021   NEUTROABS 4.1 12/30/2018    ASSESSMENT & PLAN:  Malignant neoplasm of upper-outer quadrant of right breast in female, estrogen receptor positive (HCC) Intermittent bloody nipple discharge in the right breast, mammogram showed asymmetry right breast upper outer quadrant ultrasound negative biopsy 05/26/2017 fibrocystic changes ;  Breast MRI revealed 5.6 x 3.4 cm non-mass enhancement, MRI guided biopsy revealed: IDC with DCIS, grade 2, ER 100%, PR  15%, Ki-67 5%, T3 N0 stage IIA AJCC 8 clinical stage Right lumpectomy: IDC grade 1, 0.3 cm, intermediate grade DCIS, margins negative, 0/5 lymph nodes negative, T1 a N0 stage Ia   Oncotype DX score 18: 11% risk of recurrence   Treatment plan: 1. neoadjuvant hormonal therapy versus chemotherapy 07/03/2017-06/05/2018 2. Breast conserving surgery 06/08/2018 3. Adjuvant radiation therapy (given the grade one 0.3 cm final tumor size, patient decided not to undergo adjuvant radiation) 4. Adjuvant antiestrogen therapy  --------------------------------------------------------------------------  Current treatment: Anastrozole 1 mg daily started 06/29/2018 switched to letrozole February 2022 (bone pain) stopped August 2022 because of severe pain in the bones  Breast cancer surveillance: Mammogram: 12/10/2018: Benign, breast density category B  Breast exam 03/25/2022: Benign   Return to clinic in 1 year for follow-up with long-term survivorship clinic      No orders of the defined types were placed in this encounter.  The patient has a good understanding of the overall plan. she agrees with it. she will call with any problems that may develop before the next visit here. Total time spent: 30 mins including face to face time and time spent for planning, charting and co-ordination of care   Harriette Ohara, MD 03/25/22    I Gardiner Coins am scribing for Dr. Lindi Adie  I have reviewed the above documentation for accuracy and completeness, and I agree with the above.

## 2022-03-25 ENCOUNTER — Other Ambulatory Visit: Payer: Self-pay

## 2022-03-25 ENCOUNTER — Inpatient Hospital Stay: Payer: Medicare Other | Attending: Hematology and Oncology | Admitting: Hematology and Oncology

## 2022-03-25 DIAGNOSIS — Z7901 Long term (current) use of anticoagulants: Secondary | ICD-10-CM | POA: Diagnosis not present

## 2022-03-25 DIAGNOSIS — Z79811 Long term (current) use of aromatase inhibitors: Secondary | ICD-10-CM | POA: Diagnosis not present

## 2022-03-25 DIAGNOSIS — C50411 Malignant neoplasm of upper-outer quadrant of right female breast: Secondary | ICD-10-CM | POA: Insufficient documentation

## 2022-03-25 DIAGNOSIS — Z170 Estrogen receptor positive status [ER+]: Secondary | ICD-10-CM | POA: Insufficient documentation

## 2022-03-25 NOTE — Assessment & Plan Note (Addendum)
Intermittent bloody nipple discharge in the right breast, mammogram showed asymmetry right breast upper outer quadrant ultrasound negative biopsy 05/26/2017 fibrocystic changes ; Breast MRI revealed 5.6 x 3.4 cm non-mass enhancement, MRI guided biopsy revealed: IDC with DCIS, grade 2, ER 100%, PR 15%, Ki-67 5%, T3 N0 stage IIA AJCC 8 clinical stage Right lumpectomy: IDC grade 1, 0.3 cm, intermediate grade DCIS, margins negative, 0/5 lymph nodes negative, T1 a N0 stage Ia  Oncotype DX score18: 11% risk of recurrence  Treatment plan: 1.neoadjuvant hormonal therapy versus chemotherapy9/04/2017-06/05/2018 2.Breast conserving surgery8/13/2019 3. Adjuvant radiation therapy(given the grade one 0.3 cm final tumor size, patient decided not to undergo adjuvant radiation) 4. Adjuvant antiestrogen therapy -------------------------------------------------------------------------- Current treatment:Anastrozole 1 mgdailystarted 06/29/2018 switched to letrozole February 2022 (bone pain) stopped August 2022 because of severe pain in the bones   Breast cancer surveillance: Mammogram: 12/10/2018: Benign, breast density category B  Breast exam 03/25/2022: Benign  Return to clinic in 1 year for follow-up with long-term survivorship clinic

## 2022-04-04 ENCOUNTER — Telehealth: Payer: Self-pay

## 2022-04-04 NOTE — Telephone Encounter (Signed)
Pt called stating that she had an IVC filter removed on 5/22 and that she still had sutures in her neck. Returned pt's call, two identifiers used. Spoke with Sam, PA who confirmed sutures should dissolve within a few week's time. Information given to pt, who will monitor and report back if sutures still present after another week's time.

## 2022-04-10 DIAGNOSIS — Z7901 Long term (current) use of anticoagulants: Secondary | ICD-10-CM | POA: Diagnosis not present

## 2022-04-10 DIAGNOSIS — I8291 Chronic embolism and thrombosis of unspecified vein: Secondary | ICD-10-CM | POA: Diagnosis not present

## 2022-05-08 DIAGNOSIS — Z7901 Long term (current) use of anticoagulants: Secondary | ICD-10-CM | POA: Diagnosis not present

## 2022-05-08 DIAGNOSIS — I8291 Chronic embolism and thrombosis of unspecified vein: Secondary | ICD-10-CM | POA: Diagnosis not present

## 2022-06-05 DIAGNOSIS — I2782 Chronic pulmonary embolism: Secondary | ICD-10-CM | POA: Diagnosis not present

## 2022-06-05 DIAGNOSIS — Z7901 Long term (current) use of anticoagulants: Secondary | ICD-10-CM | POA: Diagnosis not present

## 2022-07-07 DIAGNOSIS — Z7901 Long term (current) use of anticoagulants: Secondary | ICD-10-CM | POA: Diagnosis not present

## 2022-07-07 DIAGNOSIS — I2782 Chronic pulmonary embolism: Secondary | ICD-10-CM | POA: Diagnosis not present

## 2022-08-05 DIAGNOSIS — Z7901 Long term (current) use of anticoagulants: Secondary | ICD-10-CM | POA: Diagnosis not present

## 2022-08-05 DIAGNOSIS — I8291 Chronic embolism and thrombosis of unspecified vein: Secondary | ICD-10-CM | POA: Diagnosis not present

## 2022-09-04 DIAGNOSIS — Z7901 Long term (current) use of anticoagulants: Secondary | ICD-10-CM | POA: Diagnosis not present

## 2022-09-04 DIAGNOSIS — I2782 Chronic pulmonary embolism: Secondary | ICD-10-CM | POA: Diagnosis not present

## 2022-10-02 DIAGNOSIS — Z7901 Long term (current) use of anticoagulants: Secondary | ICD-10-CM | POA: Diagnosis not present

## 2022-10-02 DIAGNOSIS — I8291 Chronic embolism and thrombosis of unspecified vein: Secondary | ICD-10-CM | POA: Diagnosis not present

## 2022-10-30 DIAGNOSIS — I2782 Chronic pulmonary embolism: Secondary | ICD-10-CM | POA: Diagnosis not present

## 2022-10-30 DIAGNOSIS — Z7901 Long term (current) use of anticoagulants: Secondary | ICD-10-CM | POA: Diagnosis not present

## 2022-12-02 DIAGNOSIS — I8291 Chronic embolism and thrombosis of unspecified vein: Secondary | ICD-10-CM | POA: Diagnosis not present

## 2022-12-02 DIAGNOSIS — Z7901 Long term (current) use of anticoagulants: Secondary | ICD-10-CM | POA: Diagnosis not present

## 2023-01-09 DIAGNOSIS — Z7901 Long term (current) use of anticoagulants: Secondary | ICD-10-CM | POA: Diagnosis not present

## 2023-02-05 DIAGNOSIS — I8291 Chronic embolism and thrombosis of unspecified vein: Secondary | ICD-10-CM | POA: Diagnosis not present

## 2023-02-05 DIAGNOSIS — Z7901 Long term (current) use of anticoagulants: Secondary | ICD-10-CM | POA: Diagnosis not present

## 2023-03-05 DIAGNOSIS — Z7901 Long term (current) use of anticoagulants: Secondary | ICD-10-CM | POA: Diagnosis not present

## 2023-03-05 DIAGNOSIS — I8291 Chronic embolism and thrombosis of unspecified vein: Secondary | ICD-10-CM | POA: Diagnosis not present

## 2023-03-16 ENCOUNTER — Other Ambulatory Visit: Payer: Self-pay

## 2023-03-16 ENCOUNTER — Emergency Department (HOSPITAL_BASED_OUTPATIENT_CLINIC_OR_DEPARTMENT_OTHER): Payer: Medicare Other

## 2023-03-16 ENCOUNTER — Encounter (HOSPITAL_BASED_OUTPATIENT_CLINIC_OR_DEPARTMENT_OTHER): Payer: Self-pay

## 2023-03-16 ENCOUNTER — Emergency Department (HOSPITAL_BASED_OUTPATIENT_CLINIC_OR_DEPARTMENT_OTHER)
Admission: EM | Admit: 2023-03-16 | Discharge: 2023-03-16 | Disposition: A | Discharge status: Home / Self Care | Payer: Medicare Other | Attending: Emergency Medicine | Admitting: Emergency Medicine

## 2023-03-16 ENCOUNTER — Emergency Department (HOSPITAL_BASED_OUTPATIENT_CLINIC_OR_DEPARTMENT_OTHER): Payer: Medicare Other | Admitting: Radiology

## 2023-03-16 ENCOUNTER — Other Ambulatory Visit (HOSPITAL_BASED_OUTPATIENT_CLINIC_OR_DEPARTMENT_OTHER): Payer: Self-pay

## 2023-03-16 DIAGNOSIS — M79672 Pain in left foot: Secondary | ICD-10-CM | POA: Insufficient documentation

## 2023-03-16 DIAGNOSIS — M79605 Pain in left leg: Secondary | ICD-10-CM | POA: Diagnosis not present

## 2023-03-16 DIAGNOSIS — Z7901 Long term (current) use of anticoagulants: Secondary | ICD-10-CM | POA: Insufficient documentation

## 2023-03-16 DIAGNOSIS — M19072 Primary osteoarthritis, left ankle and foot: Secondary | ICD-10-CM | POA: Diagnosis not present

## 2023-03-16 DIAGNOSIS — R9431 Abnormal electrocardiogram [ECG] [EKG]: Secondary | ICD-10-CM | POA: Diagnosis not present

## 2023-03-16 NOTE — ED Provider Notes (Signed)
[place] [facility] DEPARTMENT AT [facility] Provider Note   CSN: [id number] Arrival date & time: 03/16/23  1158     History  Chief Complaint  Patient presents with   Leg Pain    Brittany [name] is a 77 y.o. female with past medical history of chronic back pain, DVT/PE who presents to the ED complaining of left foot pain for the past 2 weeks.  She states that she has pain to the dorsal aspect of the left foot.  She is currently anticoagulated on Coumadin due to history of DVT/PE.  She reports that she has had a therapeutic INR and has been taking her medications as prescribed.  She denies fever, chest pain, shortness of breath, palpitations, lightheadedness, dizziness, syncope, or any other complaints today.  She states that she came here to make sure she is not developing another blood clot.      Home Medications Prior to Admission medications   Medication Sig Start Date End Date Taking? Authorizing Provider  Ascorbic Acid (VITAMIN C) 1000 MG tablet Take 1,000 mg by mouth daily.    [provider]  CALCIUM PO Take 500 mg by mouth daily.    [provider]  cholecalciferol (VITAMIN D) 1000 units tablet Take 1,000 Units by mouth daily.     [provider]  docusate sodium (COLACE) 100 MG capsule Take 100 mg by mouth 2 (two) times daily.    [provider]  ferrous sulfate 325 (65 FE) MG tablet Take 1 tablet (325 mg total) by mouth 2 (two) times daily with a meal. Patient taking differently: Take 325 mg by mouth daily. 06/15/18   Claud Kelp, MD  HYDROcodone-acetaminophen (NORCO/VICODIN) 5-325 MG tablet Take 1 tablet by mouth every 4 (four) hours as needed for moderate pain (pain score 4-6). 12/06/21   Swinteck, Arlys John, MD  MAGNESIUM PO Take 500 mg by mouth daily.    [provider]  methocarbamol (ROBAXIN) 500 MG tablet Take 1 tablet (500 mg total) by mouth every 6 (six) hours as needed for muscle spasms. Patient not taking:  Reported on 03/13/2022 12/06/21   Samson Frederic, MD  ondansetron (ZOFRAN) 4 MG tablet Take 1 tablet (4 mg total) by mouth every 6 (six) hours as needed for nausea. Patient not taking: Reported on 03/13/2022 12/06/21   Samson Frederic, MD  polyethylene glycol (MIRALAX / GLYCOLAX) 17 g packet Take 17 g by mouth daily as needed for mild constipation. Patient not taking: Reported on 03/13/2022 12/06/21   Samson Frederic, MD  senna (SENOKOT) 8.6 MG TABS tablet Take 1 tablet (8.6 mg total) by mouth 2 (two) times daily. Patient not taking: Reported on 03/13/2022 12/06/21   Samson Frederic, MD  warfarin (COUMADIN) 5 MG tablet Take 1 tablet (5 mg total) by mouth daily. Except Tue and Thu Patient taking differently: Take 5 mg by mouth See admin instructions. Take on mon, wed, fri, sun., sat 06/25/17   Serena Croissant, MD  warfarin (COUMADIN) 7.5 MG tablet Take 1 tablet (7.5 mg total) by mouth daily. Tue and Thu Patient taking differently: Take 7.5 mg by mouth See admin instructions. On Tues, thur 06/25/17   Serena Croissant, MD      Allergies    Patient has no known allergies.    Review of Systems   Review of Systems  All other systems reviewed and are negative.   Physical Exam Updated Vital Signs BP 121/60   Pulse 61   Temp (!) 97.5 F (36.4 C) (  Oral)   Resp 18   Ht 5\' 5"  (1.651 m)   Wt 90.7 kg   SpO2 99%   BMI 33.28 kg/m  Physical Exam Vitals and nursing note reviewed.  Constitutional:      General: She is not in acute distress.    Appearance: Normal appearance. She is not ill-appearing or toxic-appearing.  HENT:     Head: Normocephalic and atraumatic.     Mouth/Throat:     Mouth: Mucous membranes are moist.  Eyes:     Conjunctiva/sclera: Conjunctivae normal.  Cardiovascular:     Rate and Rhythm: Normal rate and regular rhythm.     Heart sounds: No murmur heard. Pulmonary:     Effort: Pulmonary effort is normal. No respiratory distress.     Breath sounds: Normal breath sounds. No stridor.  No wheezing or rales.  Chest:     Chest wall: No tenderness.  Abdominal:     General: Abdomen is flat. There is no distension.     Palpations: Abdomen is soft.     Tenderness: There is no abdominal tenderness. There is no guarding or rebound.  Musculoskeletal:        General: Normal range of motion.     Cervical back: Neck supple.     Right lower leg: No edema.     Left lower leg: No edema.     Comments: Mild tenderness to the left dorsal foot diffusely, no overlying erythema, increased warmth, wounds, swelling, or other skin changes, 2+ DP and PT pulses bilaterally, no calf tenderness bilaterally, no tenderness to right lower extremity, range of motion and sensation intact, soft compartments  Skin:    General: Skin is warm and dry.     Capillary Refill: Capillary refill takes less than 2 seconds.  Neurological:     Mental Status: She is alert. Mental status is at baseline.  Psychiatric:        Behavior: Behavior normal.     ED Results / Procedures / Treatments   Labs (all labs ordered are listed, but only abnormal results are displayed) Labs Reviewed - No data to display  EKG NSR at 64bpm, no acute ST-T changes, no STEMI  Radiology US Venous Img Lower  Left (DVT Study)  Result Date: 03/16/2023 CLINICAL DATA:  Left leg pain EXAM: LEFT LOWER EXTREMITY VENOUS DOPPLER ULTRASOUND TECHNIQUE: Gray-scale sonography with compression, as well as color and duplex ultrasound, were performed to evaluate the deep venous system(s) from the level of the common femoral vein through the popliteal and proximal calf veins. COMPARISON:  07/24/2021 FINDINGS: VENOUS Normal compressibility of the common femoral, superficial femoral, and popliteal veins, as well as the visualized calf veins. Visualized portions of profunda femoral vein and great saphenous vein unremarkable. No filling defects to suggest DVT on grayscale or color Doppler imaging. Doppler waveforms show normal direction of venous flow,  normal respiratory plasticity and response to augmentation. Limited views of the contralateral common femoral vein are unremarkable. OTHER None. Limitations: none IMPRESSION: Negative. Electronically Signed   By: Duanne Guess D.O.   On: 03/16/2023 14:26   DG Foot 2 Views Left  Result Date: 03/16/2023 CLINICAL DATA:  Left lower leg and foot pain.  History of DVT. EXAM: LEFT FOOT - 2 VIEW COMPARISON:  Radiographs 03/08/2007. FINDINGS: The bones appear mildly demineralized. There is no evidence of acute fracture or dislocation. 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 Mild-to-moderate degenerative changes at the 1st metatarsophalangeal joint. A suggested tiny metallic  foreign body within the medial hindfoot soft tissues on the frontal examination is not clearly seen on the lateral view and could be artifactual. Stable small plantar calcaneal spur. IMPRESSION: No acute osseous findings. 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 Electronically Signed   By: Carey Bullocks M.D.   On: 03/16/2023 12:49    Procedures Procedures    Medications Ordered in ED Medications - No data to display  ED Course/ Medical Decision Making/ A&P                             Medical Decision Making Amount and/or Complexity of Data Reviewed Radiology: ordered. Decision-making details documented in ED Course. ECG/medicine tests: ordered. Decision-making details documented in ED Course.   Medical Decision Making:   ZHARIAH ZALES is a 77 y.o. female who presented to the ED today with foot pain detailed above.    Patient's presentation is complicated by their history of DVT.  Complete initial physical exam performed, notably the patient was with mild tenderness to the left dorsal foot diffusely, no overlying erythema, increased warmth, wounds, swelling, or other skin changes, 2+ DP and PT pulses bilaterally, no  calf tenderness bilaterally, no tenderness to right lower extremity, range of motion and sensation intact, soft compartments.  LCTA. RRR. NAD.  Reviewed and confirmed nursing documentation for past medical history, family history, social history.    Initial Assessment:   With the patient's presentation, differential diagnosis includes but is not limited to fracture, dislocation, septic joint, compartment syndrome, DVT, PE, sprain, strain, foot ulcer. This is most consistent with an acute complicated illness  Initial Plan:  X-ray to assess for bony pathology Ultrasound to assess for DVT EKG to evaluate for cardiac pathology Symptomatic management declined the patient Objective evaluation as below reviewed   Initial Study Results:   EKG EKG was reviewed independently. NSR. No acute ST-T changes. No STEMI.   Radiology:  All images reviewed independently. Agree with radiology report at this time.   US Venous Img Lower  Left (DVT Study)  Result Date: 03/16/2023 CLINICAL DATA:  Left leg pain EXAM: LEFT LOWER EXTREMITY VENOUS DOPPLER ULTRASOUND TECHNIQUE: Gray-scale sonography with compression, as well as color and duplex ultrasound, were performed to evaluate the deep venous system(s) from the level of the common femoral vein through the popliteal and proximal calf veins. COMPARISON:  07/24/2021 FINDINGS: VENOUS Normal compressibility of the common femoral, superficial femoral, and popliteal veins, as well as the visualized calf veins. Visualized portions of profunda femoral vein and great saphenous vein unremarkable. No filling defects to suggest DVT on grayscale or color Doppler imaging. Doppler waveforms show normal direction of venous flow, normal respiratory plasticity and response to augmentation. Limited views of the contralateral common femoral vein are unremarkable. OTHER None. Limitations: none IMPRESSION: Negative. Electronically Signed   By: Duanne Guess D.O.   On: 03/16/2023 14:26    DG Foot 2 Views Left  Result Date: 03/16/2023 CLINICAL DATA:  Left lower leg and foot pain.  History of DVT. EXAM: LEFT FOOT - 2 VIEW COMPARISON:  Radiographs 03/08/2007. FINDINGS: The bones appear mildly demineralized. There is no evidence of acute fracture or dislocation. 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 Mild-to-moderate degenerative changes at the 1st metatarsophalangeal joint. A suggested tiny metallic foreign body within the medial hindfoot soft tissues on the frontal examination is not clearly seen on the  lateral view and could be artifactual. Stable small plantar calcaneal spur. IMPRESSION: No acute osseous findings. 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 Electronically Signed   By: Carey Bullocks M.D.   On: 03/16/2023 12:49    Final Assessment and Plan:   77 year old female presents to the ED complaining of left foot pain.  No injury to the foot.  She does have a history of DVT currently anticoagulated on Coumadin.  Her main concern is for a DVT.  No chest pain, shortness of breath, palpitations.  Maintaining oxygen saturation on room air.  No signs of respiratory distress.  INR has been therapeutic.  Vital signs stable.  On exam, she has soft compartments, neurovascularly intact.  She does have diffuse mild tenderness over the left dorsal foot but no overlying skin changes or wounds.  No calf tenderness bilaterally.  Ultrasound and x-ray ordered for further evaluation.  EKG normal sinus rhythm without acute ST-T changes.  Patient remained comfortable and in no acute distress throughout ED stay.  X-ray without acute changes.  Ultrasound negative for DVT.  Will have patient continue Coumadin at home and follow-up outpatient as needed.  Strict ED return precautions given, all questions answered, and stable for discharge.   Clinical Impression:  1. Left foot pain      Discharge            Final Clinical Impression(s) / ED Diagnoses Final diagnoses:  Left foot pain    Rx / DC Orders ED Discharge Orders     None         Tonette Lederer, PA-C 03/16/23 1443    Benjiman Core, MD 03/17/23 [phone]

## 2023-03-16 NOTE — ED Notes (Signed)
Pt discharged home after verbalizing understanding of discharge instructions; nad noted. 

## 2023-03-16 NOTE — ED Triage Notes (Signed)
Patient here POV from Home.  Endorses Left Foot Discoloration and left Leg Pain that began 1.5 Weeks ago. Consistent since it began. No Trauma noted.   Warfarin for previous emboli. No SOB. No Fevers.   NAD Noted during Triage. A&Ox4. GCS 15. Ambulatory.

## 2023-03-16 NOTE — Discharge Instructions (Signed)
Thank you for letting us take care of you today.  Your x-ray, EKG, and ultrasound were normal. We do not see any infection or signs of blood clots. Please follow up with your PCP for any continued symptoms. For any injury or worsening condition including weakness in the leg, numbness, fever, chest pain, or shortness of breath, please return to nearest ED for re-evaluation.

## 2023-03-27 ENCOUNTER — Inpatient Hospital Stay: Payer: Medicare Other | Attending: Adult Health | Admitting: Adult Health

## 2023-03-27 ENCOUNTER — Encounter: Payer: Self-pay | Admitting: Adult Health

## 2023-03-27 ENCOUNTER — Ambulatory Visit (HOSPITAL_COMMUNITY)
Admission: RE | Admit: 2023-03-27 | Discharge: 2023-03-27 | Disposition: A | Discharge status: Home / Self Care | Payer: Medicare Other | Source: Ambulatory Visit | Attending: Adult Health | Admitting: Adult Health

## 2023-03-27 VITALS — BP 132/59 | HR 77 | Temp 97.2°F | Resp 18 | Ht 65.0 in | Wt 199.6 lb

## 2023-03-27 DIAGNOSIS — M4316 Spondylolisthesis, lumbar region: Secondary | ICD-10-CM | POA: Diagnosis not present

## 2023-03-27 DIAGNOSIS — M546 Pain in thoracic spine: Secondary | ICD-10-CM

## 2023-03-27 DIAGNOSIS — Z79811 Long term (current) use of aromatase inhibitors: Secondary | ICD-10-CM | POA: Diagnosis not present

## 2023-03-27 DIAGNOSIS — C50411 Malignant neoplasm of upper-outer quadrant of right female breast: Secondary | ICD-10-CM | POA: Insufficient documentation

## 2023-03-27 DIAGNOSIS — Z170 Estrogen receptor positive status [ER+]: Secondary | ICD-10-CM | POA: Insufficient documentation

## 2023-03-27 DIAGNOSIS — M545 Low back pain, unspecified: Secondary | ICD-10-CM | POA: Diagnosis not present

## 2023-03-27 NOTE — Assessment & Plan Note (Signed)
Brittany [name] is a 77 year old woman with history of stage IIa ER/PR positive right breast invasive ductal carcinoma diagnosed in August 2018 status postlumpectomy and antiestrogen therapy with letrozole that she took for approximately 4 years time.  Brittany [name] has no signs of local breast cancer recurrence today.  I recommended she continue to undergo annual mammograms however she has declined that recommendation at this point.  She is experiencing a new thoracic back pain.  I placed orders for an x-ray to further evaluate her thoracic and lumbar spine.  If this is inconclusive we can consider MRI of the spine.  She will continue to follow-up with her primary care provider regularly.  She knows to call for any questions or concerns and we can always see her sooner if needed.  She will return in 1 year for continued long-term follow-up.

## 2023-03-27 NOTE — Progress Notes (Signed)
[facility] Cancer Follow up:    Brittany Blamer, MD [address] [place] [address]   DIAGNOSIS:  Cancer Staging  Malignant neoplasm of upper-outer quadrant of right breast in female, estrogen receptor positive (HCC) Staging form: Breast, AJCC 8th Edition - Clinical: Stage IIA (cT3, cN0, cM0, G2, ER+, PR+, HER2-) - Unsigned Histologic grading system: 3 grade system - Pathologic: Stage IA (pT1a, pN0, cM0, G1, ER+, PR+, HER2-, Oncotype DX score: 18) - Signed by Loa Socks, NP on 10/12/2018 Multigene prognostic tests performed: Oncotype DX Recurrence score range: Greater than or equal to 11 Histologic grading system: 3 grade system   SUMMARY OF ONCOLOGIC HISTORY: Oncology History Overview Note  8/   Malignant neoplasm of upper-outer quadrant of right breast in female, estrogen receptor positive (HCC)  06/19/2017 Initial Diagnosis   Intermittent bloody nipple discharge in the right breast, mammogram showed asymmetry right breast upper outer quadrant ultrasound negative biopsy 05/26/2017 fibrocystic changes ; breast MRI revealed 5.6 x 3.4 cm non-mass enhancement, MRI guided biopsy revealed: IDC with DCIS, grade 2, ER 100%, PR 15%, Ki-67 5%, T3 N0 stage IIA AJCC 8 clinical stage   07/03/2017 Oncotype testing   Oncotype DX score 18: Risk of recurrence 11%   07/03/2017 -  Anti-estrogen oral therapy   Letrozole 2.5 mg daily neoadjuvant hormone therapy   06/08/2018 Surgery   Right lumpectomy: IDC grade 1, 0.3 cm, intermediate grade DCIS, margins negative, 0/5 lymph nodes negative, T1 a N0 stage Ia   06/12/2018 Surgery   Evacuation of right breast postoperative hematoma most likely due to left brachial vein DVT on anticoagulation   06/23/2018 Cancer Staging   Staging form: Breast, AJCC 8th Edition - Pathologic: Stage IA (pT1a, pN0, cM0, G1, ER+, PR+, HER2-, Oncotype DX score: 18) - Signed by Loa Socks, NP on 10/12/2018      CURRENT THERAPY: observation  INTERVAL HISTORY: Brittany [name] 77 y.o. female returns for follow-up of her history of breast cancer.  Her most recent mammogram occurred on December 05, 2021 demonstrating no mammographic evidence of malignancy and breast density category B.  She has not undergone repeat mammography since that time and is unsure if she wants to.  Brittany [name] has noted back pain that is in her mid thoracic spine that started about 4 weeks ago and radiates to the right side of her body.  She denies any associated symptoms with this but has noted the discomfort and it is worse when she is lying down.  She has not taken anything for the pain.   Patient Active Problem List   Diagnosis Date Noted   Traumatic arthritis of right hip 12/05/2021   Osteoarthritis of right hip 12/05/2021   Palpitations 11/02/2020   PVC (premature ventricular contraction) 11/02/2020   Pulmonary embolism (HCC) 11/02/2020   Acute deep vein thrombosis (DVT) of brachial vein of left upper extremity (HCC) 06/14/2018   Breast hematoma 06/12/2018   Malignant neoplasm of upper-outer quadrant of right breast in female, estrogen receptor positive (HCC) 06/25/2017    has No Known Allergies.  MEDICAL HISTORY: Past Medical History:  Diagnosis Date   Acute deep vein thrombosis (DVT) of brachial vein of left upper extremity (HCC) 06/14/2018   Cancer (HCC)    Chronic back pain    History of ectopic pregnancy    History of pulmonary embolism    anticoagulated on coumadin   Personal history of chemotherapy     SURGICAL HISTORY: Past Surgical  History:  Procedure Laterality Date   BREAST BIOPSY Right 05/25/2017   malignant   BREAST BIOPSY Right 07/14/2017   BREAST BIOPSY Right 06/22/2017   malignant   BREAST LUMPECTOMY Right 05/2018   BREAST LUMPECTOMY WITH RADIOACTIVE SEED AND SENTINEL LYMPH NODE BIOPSY Right 06/08/2018   Procedure: RIGHT BREAST LUMPECTOMY WITH DOUBLE BRACKETED RADIOACTIVE SEEDS AND  SENTINEL LYMPH NODE BIOPSY ERAS PATHWAY;  Surgeon: Claud Kelp, MD;  Location: [facility];  Service: General;  Laterality: Right;  TAP BLOCK   HIP FRACTURE SURGERY     IVC FILTER INSERTION N/A 11/25/2021   Procedure: IVC FILTER INSERTION;  Surgeon: Maeola Harman, MD;  Location: [facility] INVASIVE CV LAB;  Service: Cardiovascular;  Laterality: N/A;   IVC FILTER REMOVAL N/A 03/17/2022   Procedure: IVC FILTER REMOVAL;  Surgeon: Maeola Harman, MD;  Location: [facility] INVASIVE CV LAB;  Service: Cardiovascular;  Laterality: N/A;   MINOR BREAST BIOPSY Right 06/13/2018   Procedure: EVACUATION OF RIGHT BREAST HEMATOMA;  Surgeon: Abigail Miyamoto, MD;  Location: [facility] OR;  Service: General;  Laterality: Right;   TOTAL HIP ARTHROPLASTY Right 12/05/2021   Procedure: TOTAL HIP ARTHROPLASTY ANTERIOR APPROACH;  Surgeon: Samson Frederic, MD;  Location: [facility] ORS;  Service: Orthopedics;  Laterality: Right;  150   TUBAL LIGATION Bilateral     SOCIAL HISTORY: Social History   Socioeconomic History   Marital status: Married    Spouse name: Not on file   Number of children: 2   Years of education: Not on file   Highest education level: Not on file  Occupational History   Not on file  Tobacco Use   Smoking status: Never   Smokeless tobacco: Never  Vaping Use   Vaping Use: Never used  Substance and Sexual Activity   Alcohol use: No   Drug use: No   Sexual activity: Not on file  Other Topics Concern   Not on file  Social History Narrative   Not on file   Social Determinants of Health   Financial Resource Strain: Not on file  Food Insecurity: Not on file  Transportation Needs: Not on file  Physical Activity: Not on file  Stress: Not on file  Social Connections: Not on file  Intimate Partner Violence: Not on file    FAMILY HISTORY: Family History  Problem Relation Age of Onset   Prostate cancer Father    Prostate cancer Brother     Review of Systems  Constitutional:   Negative for appetite change, chills, fatigue, fever and unexpected weight change.  HENT:   Negative for hearing loss, lump/mass and trouble swallowing.   Eyes:  Negative for eye problems and icterus.  Respiratory:  Negative for chest tightness, cough and shortness of breath.   Cardiovascular:  Negative for chest pain, leg swelling and palpitations.  Gastrointestinal:  Negative for abdominal distention, abdominal pain, constipation, diarrhea, nausea and vomiting.  Endocrine: Negative for hot flashes.  Genitourinary:  Negative for difficulty urinating.   Musculoskeletal:  Positive for back pain. Negative for arthralgias.  Skin:  Negative for itching and rash.  Neurological:  Negative for dizziness, extremity weakness, headaches and numbness.  Hematological:  Negative for adenopathy. Does not bruise/bleed easily.  Psychiatric/Behavioral:  Negative for depression. The patient is not nervous/anxious.       PHYSICAL EXAMINATION   Onc Performance Status - 03/27/23 1103       ECOG Perf Status   ECOG Perf Status Fully active, able to carry on all pre-disease performance  without restriction      KPS SCALE   KPS % SCORE Normal activity with effort, some s/s of disease             Vitals:   03/27/23 1058  BP: (!) 132/59  Pulse: 77  Resp: 18  Temp: (!) 97.2 F (36.2 C)  SpO2: 100%    Physical Exam Constitutional:      General: She is not in acute distress.    Appearance: Normal appearance. She is not toxic-appearing.  HENT:     Head: Normocephalic and atraumatic.     Mouth/Throat:     Mouth: Mucous membranes are moist.     Pharynx: Oropharynx is clear. No oropharyngeal exudate or posterior oropharyngeal erythema.  Eyes:     General: No scleral icterus. Cardiovascular:     Rate and Rhythm: Normal rate and regular rhythm.     Pulses: Normal pulses.     Heart sounds: Normal heart sounds.  Pulmonary:     Effort: Pulmonary effort is normal.     Breath sounds: Normal breath  sounds.  Chest:     Comments: Right breast status postlumpectomy and radiation no sign of local recurrence left breast is benign. Abdominal:     General: Abdomen is flat. Bowel sounds are normal. There is no distension.     Palpations: Abdomen is soft.     Tenderness: There is no abdominal tenderness.  Musculoskeletal:        General: No swelling.     Cervical back: Neck supple.  Lymphadenopathy:     Cervical: No cervical adenopathy.  Skin:    General: Skin is warm and dry.     Findings: No rash.  Neurological:     General: No focal deficit present.     Mental Status: She is alert.  Psychiatric:        Mood and Affect: Mood normal.        Behavior: Behavior normal.     LABORATORY DATA: None today   ASSESSMENT and THERAPY PLAN:   Malignant neoplasm of upper-outer quadrant of right breast in female, estrogen receptor positive (HCC) Brittany [name] is a 77 year old woman with history of stage IIa ER/PR positive right breast invasive ductal carcinoma diagnosed in August 2018 status postlumpectomy and antiestrogen therapy with letrozole that she took for approximately 4 years time.  British has no signs of local breast cancer recurrence today.  I recommended she continue to undergo annual mammograms however she has declined that recommendation at this point.  She is experiencing a new thoracic back pain.  I placed orders for an x-ray to further evaluate her thoracic and lumbar spine.  If this is inconclusive we can consider MRI of the spine.  She will continue to follow-up with her primary care provider regularly.  She knows to call for any questions or concerns and we can always see her sooner if needed.  She will return in 1 year for continued long-term follow-up.   All questions were answered. The patient knows to call the clinic with any problems, questions or concerns. We can certainly see the patient much sooner if necessary.  Total encounter time:20 minutes*in face-to-face visit  time, chart review, lab review, care coordination, order entry, and documentation of the encounter time.    Lillard Anes, NP 03/27/23 11:37 AM Medical Oncology and Hematology [facility] [address] [place], [place] [address] Tel. [phone]    Fax. [phone]  *Total Encounter Time as defined by the Centers  for Medicare and Medicaid Services includes, in addition to the face-to-face time of a patient visit (documented in the note above) non-face-to-face time: obtaining and reviewing outside history, ordering and reviewing medications, tests or procedures, care coordination (communications with other health care professionals or caregivers) and documentation in the medical record.

## 2023-03-30 ENCOUNTER — Telehealth: Payer: Self-pay | Admitting: Adult Health

## 2023-03-30 NOTE — Telephone Encounter (Signed)
Scheduled appointment per 5/30 los. Patient is aware. 

## 2023-04-02 DIAGNOSIS — Z7901 Long term (current) use of anticoagulants: Secondary | ICD-10-CM | POA: Diagnosis not present

## 2023-04-02 DIAGNOSIS — I8291 Chronic embolism and thrombosis of unspecified vein: Secondary | ICD-10-CM | POA: Diagnosis not present

## 2023-04-03 ENCOUNTER — Telehealth: Payer: Self-pay

## 2023-04-03 NOTE — Telephone Encounter (Signed)
-----   Message from Loa Socks, NP sent at 04/01/2023  6:32 PM EDT ----- Xrays show degeneration, please let patient know and ask how her discomfort is  ----- Message ----- From: Interface, Rad Results In Sent: 04/01/2023   1:41 PM EDT To: Loa Socks, NP

## 2023-04-03 NOTE — Telephone Encounter (Signed)
S/w pt per NP. Pt reports pain has improved, but she still has some dull aching and does not know what exacerbated the pain. She is concerned this will happen again. Message routed to NP for further recommendation.

## 2023-04-03 NOTE — Telephone Encounter (Signed)
Hey Laura--please remind me of exact location of this pain?  She may benefit from referral to Dr. Denyse Amass at sports medicine

## 2023-04-05 NOTE — Telephone Encounter (Signed)
Yes please

## 2023-04-06 ENCOUNTER — Other Ambulatory Visit: Payer: Self-pay

## 2023-04-06 DIAGNOSIS — Z170 Estrogen receptor positive status [ER+]: Secondary | ICD-10-CM

## 2023-04-06 DIAGNOSIS — M546 Pain in thoracic spine: Secondary | ICD-10-CM

## 2023-04-06 NOTE — Telephone Encounter (Signed)
Order entered and pt aware

## 2023-04-10 DIAGNOSIS — Z7901 Long term (current) use of anticoagulants: Secondary | ICD-10-CM | POA: Diagnosis not present

## 2023-04-24 DIAGNOSIS — Z7901 Long term (current) use of anticoagulants: Secondary | ICD-10-CM | POA: Diagnosis not present

## 2023-06-12 DIAGNOSIS — Z7901 Long term (current) use of anticoagulants: Secondary | ICD-10-CM | POA: Diagnosis not present

## 2023-06-12 DIAGNOSIS — I2782 Chronic pulmonary embolism: Secondary | ICD-10-CM | POA: Diagnosis not present

## 2023-06-16 DIAGNOSIS — Z0000 Encounter for general adult medical examination without abnormal findings: Secondary | ICD-10-CM | POA: Diagnosis not present

## 2023-06-16 DIAGNOSIS — Z86711 Personal history of pulmonary embolism: Secondary | ICD-10-CM | POA: Diagnosis not present

## 2023-06-16 DIAGNOSIS — Z853 Personal history of malignant neoplasm of breast: Secondary | ICD-10-CM | POA: Diagnosis not present

## 2023-06-16 DIAGNOSIS — E7800 Pure hypercholesterolemia, unspecified: Secondary | ICD-10-CM | POA: Diagnosis not present

## 2023-06-16 DIAGNOSIS — I8291 Chronic embolism and thrombosis of unspecified vein: Secondary | ICD-10-CM | POA: Diagnosis not present

## 2023-06-16 DIAGNOSIS — E2839 Other primary ovarian failure: Secondary | ICD-10-CM | POA: Diagnosis not present

## 2023-06-16 DIAGNOSIS — Z1211 Encounter for screening for malignant neoplasm of colon: Secondary | ICD-10-CM | POA: Diagnosis not present

## 2023-06-22 ENCOUNTER — Other Ambulatory Visit: Payer: Self-pay | Admitting: Family Medicine

## 2023-06-22 DIAGNOSIS — E2839 Other primary ovarian failure: Secondary | ICD-10-CM

## 2023-07-07 DIAGNOSIS — E7800 Pure hypercholesterolemia, unspecified: Secondary | ICD-10-CM | POA: Diagnosis not present

## 2023-08-25 DIAGNOSIS — Z7901 Long term (current) use of anticoagulants: Secondary | ICD-10-CM | POA: Diagnosis not present

## 2023-08-25 DIAGNOSIS — I8291 Chronic embolism and thrombosis of unspecified vein: Secondary | ICD-10-CM | POA: Diagnosis not present

## 2023-09-03 DIAGNOSIS — H52223 Regular astigmatism, bilateral: Secondary | ICD-10-CM | POA: Diagnosis not present

## 2023-09-03 DIAGNOSIS — H5201 Hypermetropia, right eye: Secondary | ICD-10-CM | POA: Diagnosis not present

## 2023-09-03 DIAGNOSIS — H5212 Myopia, left eye: Secondary | ICD-10-CM | POA: Diagnosis not present

## 2023-09-03 DIAGNOSIS — H25813 Combined forms of age-related cataract, bilateral: Secondary | ICD-10-CM | POA: Diagnosis not present

## 2023-09-03 DIAGNOSIS — H43813 Vitreous degeneration, bilateral: Secondary | ICD-10-CM | POA: Diagnosis not present

## 2023-09-21 DIAGNOSIS — Z7901 Long term (current) use of anticoagulants: Secondary | ICD-10-CM | POA: Diagnosis not present

## 2023-09-21 DIAGNOSIS — Z86711 Personal history of pulmonary embolism: Secondary | ICD-10-CM | POA: Diagnosis not present

## 2023-09-23 ENCOUNTER — Other Ambulatory Visit: Payer: Self-pay | Admitting: Family Medicine

## 2023-09-23 DIAGNOSIS — Z0000 Encounter for general adult medical examination without abnormal findings: Secondary | ICD-10-CM

## 2023-09-30 DIAGNOSIS — Z7901 Long term (current) use of anticoagulants: Secondary | ICD-10-CM | POA: Diagnosis not present

## 2023-09-30 DIAGNOSIS — I2782 Chronic pulmonary embolism: Secondary | ICD-10-CM | POA: Diagnosis not present

## 2023-10-05 DIAGNOSIS — D122 Benign neoplasm of ascending colon: Secondary | ICD-10-CM | POA: Diagnosis not present

## 2023-10-05 DIAGNOSIS — K648 Other hemorrhoids: Secondary | ICD-10-CM | POA: Diagnosis not present

## 2023-10-05 DIAGNOSIS — Z860101 Personal history of adenomatous and serrated colon polyps: Secondary | ICD-10-CM | POA: Diagnosis not present

## 2023-10-05 DIAGNOSIS — Z09 Encounter for follow-up examination after completed treatment for conditions other than malignant neoplasm: Secondary | ICD-10-CM | POA: Diagnosis not present

## 2023-10-07 ENCOUNTER — Ambulatory Visit
Admission: RE | Admit: 2023-10-07 | Discharge: 2023-10-07 | Disposition: A | Discharge status: Home / Self Care | Payer: Medicare Other | Source: Ambulatory Visit | Attending: Family Medicine | Admitting: Family Medicine

## 2023-10-07 ENCOUNTER — Ambulatory Visit: Payer: Medicare Other

## 2023-10-07 DIAGNOSIS — Z0000 Encounter for general adult medical examination without abnormal findings: Secondary | ICD-10-CM

## 2023-10-07 DIAGNOSIS — Z1231 Encounter for screening mammogram for malignant neoplasm of breast: Secondary | ICD-10-CM | POA: Diagnosis not present

## 2023-10-07 DIAGNOSIS — D122 Benign neoplasm of ascending colon: Secondary | ICD-10-CM | POA: Diagnosis not present

## 2023-10-12 DIAGNOSIS — Z7901 Long term (current) use of anticoagulants: Secondary | ICD-10-CM | POA: Diagnosis not present

## 2023-10-12 DIAGNOSIS — I8291 Chronic embolism and thrombosis of unspecified vein: Secondary | ICD-10-CM | POA: Diagnosis not present

## 2023-11-12 DIAGNOSIS — Z7901 Long term (current) use of anticoagulants: Secondary | ICD-10-CM | POA: Diagnosis not present

## 2023-11-26 DIAGNOSIS — I8291 Chronic embolism and thrombosis of unspecified vein: Secondary | ICD-10-CM | POA: Diagnosis not present

## 2023-11-26 DIAGNOSIS — Z7901 Long term (current) use of anticoagulants: Secondary | ICD-10-CM | POA: Diagnosis not present

## 2023-12-10 DIAGNOSIS — Z7901 Long term (current) use of anticoagulants: Secondary | ICD-10-CM | POA: Diagnosis not present

## 2023-12-10 DIAGNOSIS — I2782 Chronic pulmonary embolism: Secondary | ICD-10-CM | POA: Diagnosis not present

## 2023-12-15 DIAGNOSIS — H25013 Cortical age-related cataract, bilateral: Secondary | ICD-10-CM | POA: Diagnosis not present

## 2023-12-15 DIAGNOSIS — H18413 Arcus senilis, bilateral: Secondary | ICD-10-CM | POA: Diagnosis not present

## 2023-12-15 DIAGNOSIS — H2513 Age-related nuclear cataract, bilateral: Secondary | ICD-10-CM | POA: Diagnosis not present

## 2023-12-15 DIAGNOSIS — H2511 Age-related nuclear cataract, right eye: Secondary | ICD-10-CM | POA: Diagnosis not present

## 2023-12-15 DIAGNOSIS — H25043 Posterior subcapsular polar age-related cataract, bilateral: Secondary | ICD-10-CM | POA: Diagnosis not present

## 2023-12-25 ENCOUNTER — Other Ambulatory Visit: Payer: Medicare Other

## 2023-12-28 ENCOUNTER — Ambulatory Visit
Admission: RE | Admit: 2023-12-28 | Discharge: 2023-12-28 | Disposition: A | Discharge status: Home / Self Care | Payer: Medicare Other | Source: Ambulatory Visit | Attending: Family Medicine | Admitting: Family Medicine

## 2023-12-28 DIAGNOSIS — E2839 Other primary ovarian failure: Secondary | ICD-10-CM

## 2023-12-28 DIAGNOSIS — N958 Other specified menopausal and perimenopausal disorders: Secondary | ICD-10-CM | POA: Diagnosis not present

## 2023-12-28 DIAGNOSIS — M8588 Other specified disorders of bone density and structure, other site: Secondary | ICD-10-CM | POA: Diagnosis not present

## 2024-01-04 DIAGNOSIS — Z7901 Long term (current) use of anticoagulants: Secondary | ICD-10-CM | POA: Diagnosis not present

## 2024-01-04 DIAGNOSIS — I8291 Chronic embolism and thrombosis of unspecified vein: Secondary | ICD-10-CM | POA: Diagnosis not present

## 2024-02-01 DIAGNOSIS — I2782 Chronic pulmonary embolism: Secondary | ICD-10-CM | POA: Diagnosis not present

## 2024-02-01 DIAGNOSIS — Z7901 Long term (current) use of anticoagulants: Secondary | ICD-10-CM | POA: Diagnosis not present

## 2024-02-12 DIAGNOSIS — H43813 Vitreous degeneration, bilateral: Secondary | ICD-10-CM | POA: Diagnosis not present

## 2024-02-12 DIAGNOSIS — H25813 Combined forms of age-related cataract, bilateral: Secondary | ICD-10-CM | POA: Diagnosis not present

## 2024-02-12 DIAGNOSIS — H2511 Age-related nuclear cataract, right eye: Secondary | ICD-10-CM | POA: Diagnosis not present

## 2024-02-12 DIAGNOSIS — H52223 Regular astigmatism, bilateral: Secondary | ICD-10-CM | POA: Diagnosis not present

## 2024-02-12 DIAGNOSIS — H2512 Age-related nuclear cataract, left eye: Secondary | ICD-10-CM | POA: Diagnosis not present

## 2024-02-12 DIAGNOSIS — H5201 Hypermetropia, right eye: Secondary | ICD-10-CM | POA: Diagnosis not present

## 2024-02-12 DIAGNOSIS — H5212 Myopia, left eye: Secondary | ICD-10-CM | POA: Diagnosis not present

## 2024-02-26 DIAGNOSIS — H52223 Regular astigmatism, bilateral: Secondary | ICD-10-CM | POA: Diagnosis not present

## 2024-02-26 DIAGNOSIS — H25042 Posterior subcapsular polar age-related cataract, left eye: Secondary | ICD-10-CM | POA: Diagnosis not present

## 2024-02-26 DIAGNOSIS — H25813 Combined forms of age-related cataract, bilateral: Secondary | ICD-10-CM | POA: Diagnosis not present

## 2024-02-26 DIAGNOSIS — H2512 Age-related nuclear cataract, left eye: Secondary | ICD-10-CM | POA: Diagnosis not present

## 2024-02-26 DIAGNOSIS — H25012 Cortical age-related cataract, left eye: Secondary | ICD-10-CM | POA: Diagnosis not present

## 2024-02-26 DIAGNOSIS — H43813 Vitreous degeneration, bilateral: Secondary | ICD-10-CM | POA: Diagnosis not present

## 2024-02-26 DIAGNOSIS — H2511 Age-related nuclear cataract, right eye: Secondary | ICD-10-CM | POA: Diagnosis not present

## 2024-02-26 DIAGNOSIS — H5213 Myopia, bilateral: Secondary | ICD-10-CM | POA: Diagnosis not present

## 2024-03-03 DIAGNOSIS — Z7901 Long term (current) use of anticoagulants: Secondary | ICD-10-CM | POA: Diagnosis not present

## 2024-03-03 DIAGNOSIS — I8291 Chronic embolism and thrombosis of unspecified vein: Secondary | ICD-10-CM | POA: Diagnosis not present

## 2024-03-25 ENCOUNTER — Inpatient Hospital Stay: Payer: Medicare Other | Admitting: Adult Health

## 2024-03-31 DIAGNOSIS — Z7901 Long term (current) use of anticoagulants: Secondary | ICD-10-CM | POA: Diagnosis not present

## 2024-03-31 DIAGNOSIS — I2782 Chronic pulmonary embolism: Secondary | ICD-10-CM | POA: Diagnosis not present

## 2024-04-06 NOTE — Progress Notes (Signed)
 [facility] Cancer Follow up:    Roselind Congo, MD [address] [place] [address]   DIAGNOSIS:  Cancer Staging  Malignant neoplasm of upper-outer quadrant of right breast in female, estrogen receptor positive (HCC) Staging form: Breast, AJCC 8th Edition - Clinical: Stage IIA (cT3, cN0, cM0, G2, ER+, PR+, HER2-) - Unsigned Histologic grading system: 3 grade system - Pathologic: Stage IA (pT1a, pN0, cM0, G1, ER+, PR+, HER2-, Oncotype DX score: 18) - Signed by Percival Brace, NP on 10/12/2018 Multigene prognostic tests performed: Oncotype DX Recurrence score range: Greater than or equal to 11 Histologic grading system: 3 grade system    SUMMARY OF ONCOLOGIC HISTORY: Oncology History Overview Note  8/   Malignant neoplasm of upper-outer quadrant of right breast in female, estrogen receptor positive (HCC)  06/19/2017 Initial Diagnosis   Intermittent bloody nipple discharge in the right breast, mammogram showed asymmetry right breast upper outer quadrant ultrasound negative biopsy 05/26/2017 fibrocystic changes ; breast MRI revealed 5.6 x 3.4 cm non-mass enhancement, MRI guided biopsy revealed: IDC with DCIS, grade 2, ER 100%, PR 15%, Ki-67 5%, T3 N0 stage IIA AJCC 8 clinical stage   07/03/2017 Oncotype testing   Oncotype DX score 18: Risk of recurrence 11%   07/03/2017 -  Anti-estrogen oral therapy   Letrozole  2.5 mg daily neoadjuvant hormone therapy   06/08/2018 Surgery   Right lumpectomy: IDC grade 1, 0.3 cm, intermediate grade DCIS, margins negative, 0/5 lymph nodes negative, T1 a N0 stage Ia   06/12/2018 Surgery   Evacuation of right breast postoperative hematoma most likely due to left brachial vein DVT on anticoagulation   06/23/2018 Cancer Staging   Staging form: Breast, AJCC 8th Edition - Pathologic: Stage IA (pT1a, pN0, cM0, G1, ER+, PR+, HER2-, Oncotype DX score: 18) - Signed by Percival Brace, NP on 10/12/2018      CURRENT THERAPY: observation  INTERVAL HISTORY:  Discussed the use of AI scribe software for clinical note transcription with the patient, who gave verbal consent to proceed.  ADAORA [name] is a 78 year old female with breast cancer status post lumpectomy and antiestrogen therapy who presents for follow-up.  She is currently on observation following her lumpectomy and antiestrogen therapy. Her most recent mammogram on October 07, 2023, showed no evidence of malignancy and breast density category B. She experiences tingling in her breast and generalized bone aches.  Bone density testing on December 28, 2023, revealed a T-score of negative 1.8 in the forearm, indicating osteopenia. Generalized bone aches may be related to osteopenia or other factors.  She experiences shortness of breath she relates to not exercising and occasional headaches. She frequently feels cold. She has sciatic pain, which persists despite hip replacement surgery.   Patient Active Problem List   Diagnosis Date Noted   Traumatic arthritis of right hip 12/05/2021   Osteoarthritis of right hip 12/05/2021   Palpitations 11/02/2020   PVC (premature ventricular contraction) 11/02/2020   Pulmonary embolism (HCC) 11/02/2020   Acute deep vein thrombosis (DVT) of brachial vein of left upper extremity (HCC) 06/14/2018   Breast hematoma 06/12/2018   Malignant neoplasm of upper-outer quadrant of right breast in female, estrogen receptor positive (HCC) 06/25/2017    has no known allergies.  MEDICAL HISTORY: Past Medical History:  Diagnosis Date   Acute deep vein thrombosis (DVT) of brachial vein of left upper extremity (HCC) 06/14/2018   Cancer (HCC)    Chronic back pain  History of ectopic pregnancy    History of pulmonary embolism    anticoagulated on coumadin    Personal history of chemotherapy     SURGICAL HISTORY: Past Surgical History:  Procedure Laterality Date   BREAST BIOPSY Right 05/25/2017    malignant   BREAST BIOPSY Right 07/14/2017   BREAST BIOPSY Right 06/22/2017   malignant   BREAST LUMPECTOMY Right 05/2018   BREAST LUMPECTOMY WITH RADIOACTIVE SEED AND SENTINEL LYMPH NODE BIOPSY Right 06/08/2018   Procedure: RIGHT BREAST LUMPECTOMY WITH DOUBLE BRACKETED RADIOACTIVE SEEDS AND SENTINEL LYMPH NODE BIOPSY ERAS PATHWAY;  Surgeon: Boyce Byes, MD;  Location: [facility];  Service: General;  Laterality: Right;  TAP BLOCK   HIP FRACTURE SURGERY     IVC FILTER INSERTION N/A 11/25/2021   Procedure: IVC FILTER INSERTION;  Surgeon: Adine Hoof, MD;  Location: [facility] INVASIVE CV LAB;  Service: Cardiovascular;  Laterality: N/A;   IVC FILTER REMOVAL N/A 03/17/2022   Procedure: IVC FILTER REMOVAL;  Surgeon: Adine Hoof, MD;  Location: [facility] INVASIVE CV LAB;  Service: Cardiovascular;  Laterality: N/A;   MINOR BREAST BIOPSY Right 06/13/2018   Procedure: EVACUATION OF RIGHT BREAST HEMATOMA;  Surgeon: Oza Blumenthal, MD;  Location: [facility] OR;  Service: General;  Laterality: Right;   TOTAL HIP ARTHROPLASTY Right 12/05/2021   Procedure: TOTAL HIP ARTHROPLASTY ANTERIOR APPROACH;  Surgeon: Adonica Hoose, MD;  Location: [facility] ORS;  Service: Orthopedics;  Laterality: Right;  150   TUBAL LIGATION Bilateral     SOCIAL HISTORY: Social History   Socioeconomic History   Marital status: Married    Spouse name: Not on file   Number of children: 2   Years of education: Not on file   Highest education level: Not on file  Occupational History   Not on file  Tobacco Use   Smoking status: Never   Smokeless tobacco: Never  Vaping Use   Vaping status: Never Used  Substance and Sexual Activity   Alcohol  use: No   Drug use: No   Sexual activity: Not on file  Other Topics Concern   Not on file  Social History Narrative   Not on file   Social Drivers of Health   Financial Resource Strain: Not on file  Food Insecurity: Not on file  Transportation Needs: Not on file   Physical Activity: Not on file  Stress: Not on file  Social Connections: Not on file  Intimate Partner Violence: Not on file    FAMILY HISTORY: Family History  Problem Relation Age of Onset   Prostate cancer Father    Prostate cancer Brother     Review of Systems  Constitutional:  Negative for appetite change, chills, fatigue, fever and unexpected weight change.  HENT:   Negative for hearing loss, lump/mass and trouble swallowing.   Eyes:  Negative for eye problems and icterus.  Respiratory:  Negative for chest tightness, cough and shortness of breath.   Cardiovascular:  Negative for chest pain, leg swelling and palpitations.  Gastrointestinal:  Negative for abdominal distention, abdominal pain, constipation, diarrhea, nausea and vomiting.  Endocrine: Negative for hot flashes.  Genitourinary:  Negative for difficulty urinating.   Musculoskeletal:  Negative for arthralgias.  Skin:  Negative for itching and rash.  Neurological:  Negative for dizziness, extremity weakness, headaches and numbness.  Hematological:  Negative for adenopathy. Does not bruise/bleed easily.  Psychiatric/Behavioral:  Negative for depression. The patient is not nervous/anxious.       PHYSICAL EXAMINATION    Vitals:  04/07/24 1104  BP: 124/60  Pulse: 73  Resp: 18  Temp: 98.2 F (36.8 C)  SpO2: 100%    Physical Exam Constitutional:      General: She is not in acute distress.    Appearance: Normal appearance. She is not toxic-appearing.  HENT:     Head: Normocephalic and atraumatic.     Mouth/Throat:     Mouth: Mucous membranes are moist.     Pharynx: Oropharynx is clear. No oropharyngeal exudate or posterior oropharyngeal erythema.   Eyes:     General: No scleral icterus.   Cardiovascular:     Rate and Rhythm: Normal rate and regular rhythm.     Pulses: Normal pulses.     Heart sounds: Normal heart sounds.  Pulmonary:     Effort: Pulmonary effort is normal.     Breath sounds:  Normal breath sounds.  Chest:     Comments: Right breast s/p lumpectomy, no sign of local recurrence, left breast benign Abdominal:     General: Abdomen is flat. Bowel sounds are normal. There is no distension.     Palpations: Abdomen is soft.     Tenderness: There is no abdominal tenderness.   Musculoskeletal:        General: No swelling.     Cervical back: Neck supple.  Lymphadenopathy:     Cervical: No cervical adenopathy.     Upper Body:     Right upper body: No supraclavicular or axillary adenopathy.     Left upper body: No supraclavicular or axillary adenopathy.   Skin:    General: Skin is warm and dry.     Findings: No rash.   Neurological:     General: No focal deficit present.     Mental Status: She is alert.   Psychiatric:        Mood and Affect: Mood normal.        Behavior: Behavior normal.     LABORATORY DATA:  CBC    Component Value Date/Time   WBC 10.6 (H) 12/06/2021 0317   RBC 3.76 (L) 12/06/2021 0317   HGB 13.9 03/17/2022 1400   HGB 11.4 (L) 12/30/2018 1126   HCT 41.0 03/17/2022 1400   PLT 194 12/06/2021 0317   PLT 288 12/30/2018 1126   MCV 83.8 12/06/2021 0317   MCH 25.8 (L) 12/06/2021 0317   MCHC 30.8 12/06/2021 0317   RDW 14.6 12/06/2021 0317   LYMPHSABS 2.9 12/30/2018 1126   MONOABS 0.6 12/30/2018 1126   EOSABS 0.3 12/30/2018 1126   BASOSABS 0.0 12/30/2018 1126    CMP     Component Value Date/Time   NA 141 03/17/2022 1400   K 4.0 03/17/2022 1400   CL 107 03/17/2022 1400   CO2 23 12/06/2021 0317   GLUCOSE 90 03/17/2022 1400   BUN 12 03/17/2022 1400   CREATININE 0.60 03/17/2022 1400   CREATININE 0.83 12/28/2017 0854   CALCIUM  8.3 (L) 12/06/2021 0317   PROT 7.0 06/04/2019 1502   ALBUMIN 3.6 06/04/2019 1502   AST 15 06/04/2019 1502   AST 20 12/28/2017 0854   ALT 12 06/04/2019 1502   ALT 24 12/28/2017 0854   ALKPHOS 100 06/04/2019 1502   BILITOT 0.4 06/04/2019 1502   BILITOT 0.4 12/28/2017 0854   GFRNONAA >60 12/06/2021 0317    GFRNONAA >60 12/28/2017 0854   GFRAA >60 06/04/2019 1251   GFRAA >60 12/28/2017 0854     ASSESSMENT and THERAPY PLAN:   Malignant neoplasm of upper-outer quadrant of right  breast in female, estrogen receptor positive (HCC) Tijuana is a 78 year old woman with history of stage IIa ER/PR positive right breast invasive ductal carcinoma diagnosed in August 2018 status postlumpectomy and antiestrogen therapy with letrozole  that she took for approximately 4 years time.  Breast cancer Status post lumpectomy and adjuvant antiestrogen therapy. Recent mammogram showed no malignancy. Continues on observation alone. - Continue annual mammograms. - Counseled on healthy diet and exercise  Osteopenia T score of -1.8 in forearm. Generalized bone aches reported, possibly related to osteopenia or arthritis. - Reviewed bone healthy including calcium , vitamin d , and weight bearing exercises.    RTC in 1 year for continued long-term f/u.    All questions were answered. The patient knows to call the clinic with any problems, questions or concerns. We can certainly see the patient much sooner if necessary.  Total encounter time:20 minutes*in face-to-face visit time, chart review, lab review, care coordination, order entry, and documentation of the encounter time.    Alwin Baars, NP 04/10/24 9:43 PM Medical Oncology and Hematology [facility] [address] [place], [place] [address] Tel. [phone]    Fax. [phone]  *Total Encounter Time as defined by the Centers for Medicare and Medicaid Services includes, in addition to the face-to-face time of a patient visit (documented in the note above) non-face-to-face time: obtaining and reviewing outside history, ordering and reviewing medications, tests or procedures, care coordination (communications with other health care professionals or caregivers) and documentation in the medical record.

## 2024-04-07 ENCOUNTER — Inpatient Hospital Stay: Attending: Adult Health | Admitting: Adult Health

## 2024-04-07 VITALS — BP 124/60 | HR 73 | Temp 98.2°F | Resp 18 | Ht 65.0 in | Wt 200.7 lb

## 2024-04-07 DIAGNOSIS — Z79811 Long term (current) use of aromatase inhibitors: Secondary | ICD-10-CM | POA: Diagnosis not present

## 2024-04-07 DIAGNOSIS — Z170 Estrogen receptor positive status [ER+]: Secondary | ICD-10-CM | POA: Insufficient documentation

## 2024-04-07 DIAGNOSIS — C50411 Malignant neoplasm of upper-outer quadrant of right female breast: Secondary | ICD-10-CM | POA: Diagnosis not present

## 2024-04-07 DIAGNOSIS — M8580 Other specified disorders of bone density and structure, unspecified site: Secondary | ICD-10-CM | POA: Insufficient documentation

## 2024-04-10 ENCOUNTER — Encounter: Payer: Self-pay | Admitting: Adult Health

## 2024-04-10 NOTE — Assessment & Plan Note (Signed)
 Brittany [name] is a 78 year old woman with history of stage IIa ER/PR positive right breast invasive ductal carcinoma diagnosed in August 2018 status postlumpectomy and antiestrogen therapy with letrozole  that she took for approximately 4 years time.  Breast cancer Status post lumpectomy and adjuvant antiestrogen therapy. Recent mammogram showed no malignancy. Continues on observation alone. - Continue annual mammograms. - Counseled on healthy diet and exercise  Osteopenia T score of -1.8 in forearm. Generalized bone aches reported, possibly related to osteopenia or arthritis. - Reviewed bone healthy including calcium , vitamin d , and weight bearing exercises.    RTC in 1 year for continued long-term f/u.

## 2024-04-13 DIAGNOSIS — Z7901 Long term (current) use of anticoagulants: Secondary | ICD-10-CM | POA: Diagnosis not present

## 2024-05-13 DIAGNOSIS — I8291 Chronic embolism and thrombosis of unspecified vein: Secondary | ICD-10-CM | POA: Diagnosis not present

## 2024-05-13 DIAGNOSIS — Z7901 Long term (current) use of anticoagulants: Secondary | ICD-10-CM | POA: Diagnosis not present

## 2024-06-10 DIAGNOSIS — Z7901 Long term (current) use of anticoagulants: Secondary | ICD-10-CM | POA: Diagnosis not present

## 2024-06-24 DIAGNOSIS — Z86711 Personal history of pulmonary embolism: Secondary | ICD-10-CM | POA: Diagnosis not present

## 2024-06-24 DIAGNOSIS — Z0000 Encounter for general adult medical examination without abnormal findings: Secondary | ICD-10-CM | POA: Diagnosis not present

## 2024-06-24 DIAGNOSIS — E7800 Pure hypercholesterolemia, unspecified: Secondary | ICD-10-CM | POA: Diagnosis not present

## 2024-06-24 DIAGNOSIS — Z853 Personal history of malignant neoplasm of breast: Secondary | ICD-10-CM | POA: Diagnosis not present

## 2024-06-24 DIAGNOSIS — R7303 Prediabetes: Secondary | ICD-10-CM | POA: Diagnosis not present

## 2024-07-08 DIAGNOSIS — I8291 Chronic embolism and thrombosis of unspecified vein: Secondary | ICD-10-CM | POA: Diagnosis not present

## 2024-07-08 DIAGNOSIS — Z7901 Long term (current) use of anticoagulants: Secondary | ICD-10-CM | POA: Diagnosis not present

## 2024-08-08 DIAGNOSIS — Z7901 Long term (current) use of anticoagulants: Secondary | ICD-10-CM | POA: Diagnosis not present

## 2024-08-08 DIAGNOSIS — I2782 Chronic pulmonary embolism: Secondary | ICD-10-CM | POA: Diagnosis not present

## 2024-09-09 DIAGNOSIS — Z7901 Long term (current) use of anticoagulants: Secondary | ICD-10-CM | POA: Diagnosis not present

## 2024-09-09 DIAGNOSIS — I2782 Chronic pulmonary embolism: Secondary | ICD-10-CM | POA: Diagnosis not present

## 2024-10-07 DIAGNOSIS — I8291 Chronic embolism and thrombosis of unspecified vein: Secondary | ICD-10-CM | POA: Diagnosis not present

## 2024-10-07 DIAGNOSIS — Z7901 Long term (current) use of anticoagulants: Secondary | ICD-10-CM | POA: Diagnosis not present

## 2024-10-07 DIAGNOSIS — Z86711 Personal history of pulmonary embolism: Secondary | ICD-10-CM | POA: Diagnosis not present

## 2025-04-07 ENCOUNTER — Inpatient Hospital Stay

## 2025-04-07 ENCOUNTER — Encounter: Admitting: Adult Health
